# Patient Record
Sex: Male | Born: 1950 | Race: Black or African American | Hispanic: No | Marital: Married | Smoking: Never smoker
Health system: Southern US, Community
[De-identification: ages and names within clinical notes are randomized; demographics above are authoritative.]

## PROBLEM LIST (undated history)

## (undated) DIAGNOSIS — G4730 Sleep apnea, unspecified: Secondary | ICD-10-CM

## (undated) DIAGNOSIS — E785 Hyperlipidemia, unspecified: Secondary | ICD-10-CM

## (undated) DIAGNOSIS — I10 Essential (primary) hypertension: Secondary | ICD-10-CM

## (undated) DIAGNOSIS — N401 Enlarged prostate with lower urinary tract symptoms: Secondary | ICD-10-CM

## (undated) DIAGNOSIS — R351 Nocturia: Secondary | ICD-10-CM

## (undated) DIAGNOSIS — R350 Frequency of micturition: Principal | ICD-10-CM

## (undated) DIAGNOSIS — J45909 Unspecified asthma, uncomplicated: Secondary | ICD-10-CM

## (undated) DIAGNOSIS — K219 Gastro-esophageal reflux disease without esophagitis: Secondary | ICD-10-CM

## (undated) DIAGNOSIS — H409 Unspecified glaucoma: Secondary | ICD-10-CM

## (undated) HISTORY — DX: Hyperlipidemia, unspecified: E78.5

## (undated) HISTORY — DX: Benign prostatic hyperplasia with lower urinary tract symptoms: N40.1

## (undated) HISTORY — PX: OTHER SURGICAL HISTORY: SHX169

## (undated) HISTORY — DX: Frequency of micturition: R35.0

## (undated) HISTORY — DX: Unspecified glaucoma: H40.9

## (undated) HISTORY — DX: Sleep apnea, unspecified: G47.30

## (undated) HISTORY — DX: Nocturia: R35.1

## (undated) HISTORY — DX: Gastro-esophageal reflux disease without esophagitis: K21.9

---

## 2012-09-18 ENCOUNTER — Ambulatory Visit: Payer: Self-pay | Admitting: Emergency Medicine

## 2014-05-27 ENCOUNTER — Ambulatory Visit: Payer: Managed Care, Other (non HMO)

## 2014-05-27 ENCOUNTER — Ambulatory Visit
Admission: EM | Admit: 2014-05-27 | Discharge: 2014-05-27 | Disposition: A | Discharge status: Home / Self Care | Payer: Managed Care, Other (non HMO) | Attending: Family Medicine | Admitting: Family Medicine

## 2014-05-27 DIAGNOSIS — R059 Cough, unspecified: Secondary | ICD-10-CM

## 2014-05-27 DIAGNOSIS — J45909 Unspecified asthma, uncomplicated: Secondary | ICD-10-CM | POA: Diagnosis not present

## 2014-05-27 DIAGNOSIS — R05 Cough: Secondary | ICD-10-CM | POA: Insufficient documentation

## 2014-05-27 DIAGNOSIS — J40 Bronchitis, not specified as acute or chronic: Secondary | ICD-10-CM | POA: Insufficient documentation

## 2014-05-27 HISTORY — DX: Unspecified asthma, uncomplicated: J45.909

## 2014-05-27 MED ORDER — HYDROCOD POLST-CPM POLST ER 10-8 MG/5ML PO SUER: 5.0000 mL | Freq: Two times a day (BID) | ORAL | Status: DC | PRN

## 2014-05-27 MED ORDER — LEVOFLOXACIN 500 MG PO TABS: 500.0000 mg | ORAL_TABLET | Freq: Every day | ORAL | Status: DC

## 2014-05-27 NOTE — ED Provider Notes (Signed)
CSN: [id number]     Arrival date & time 05/27/14  6644 History   First MD Initiated Contact with Patient 05/27/14 0957     Chief Complaint  Patient presents with  . Cough   (Consider location/radiation/quality/duration/timing/severity/associated sxs/prior Treatment) Patient is a 64 y.o. male presenting with cough. The history is provided by the patient. No language interpreter was used.  Cough Cough characteristics:  Non-productive, paroxysmal, barking, nocturnal and hoarse Severity:  Severe Onset quality:  Unable to specify Duration:  3 weeks Timing:  Constant Progression:  Worsening Chronicity:  New Smoker: no   Context: exposure to allergens, fumes and upper respiratory infection   Context comment:  States he noticed this started he was in [place] driving back to a friend's place before he flew out and he states that the bed of the truck was open and the dust and fumes deserts scouted the tracking start coughing then Relieved by:  Nothing Worsened by:  Lying down Ineffective treatments:  Home nebulizer and cough suppressants (He has been seen at [facility] by the nurse practitioner. She had come on a Z-Pak and on his return visit placed him on oral steroids which does not help. He is also taking a codeine-based cough syrup and has been using his albuterol inhaler at home ) Associated symptoms: shortness of breath and wheezing   Associated symptoms: no chest pain, no chills, no diaphoresis, no ear fullness, no ear pain, no eye discharge, no fever, no headaches, no myalgias, no rash, no rhinorrhea, no sinus congestion, no sore throat and no weight loss   Shortness of breath:    Severity:  Unable to specify   Timing:  Intermittent   Progression:  Waxing and waning Risk factors: recent infection     Past Medical History  Diagnosis Date  . Asthma    History reviewed. No pertinent past surgical history. Family History  Problem Relation Age of Onset  . Diabetes Mother   .  Heart failure Father    History  Substance Use Topics  . Smoking status: Never Smoker   . Smokeless tobacco: Never Used  . Alcohol Use: No     Comment: occasional    Review of Systems  Constitutional: Negative for fever, chills, weight loss and diaphoresis.  HENT: Negative for ear pain, rhinorrhea and sore throat.   Eyes: Negative for discharge.  Respiratory: Positive for cough, shortness of breath and wheezing.   Cardiovascular: Negative for chest pain.  Musculoskeletal: Negative for myalgias.  Skin: Negative for rash.  Neurological: Negative for headaches.    Allergies  Review of patient's allergies indicates no known allergies.  Home Medications   Prior to Admission medications   Medication Sig Start Date End Date Taking? Authorizing Provider  benzonatate (TESSALON) 100 MG capsule Take by mouth 3 (three) times daily as needed for cough.   Yes Historical Provider, MD  fluticasone (FLONASE) 50 MCG/ACT nasal spray Place into both nostrils daily.   Yes Historical Provider, MD  guaiFENesin (MUCINEX) 600 MG 12 hr tablet Take by mouth 2 (two) times daily.   Yes Historical Provider, MD  guaiFENesin-codeine (ROBITUSSIN AC) 100-10 MG/5ML syrup Take 5 mLs by mouth 3 (three) times daily as needed for cough.   Yes Historical Provider, MD  predniSONE (DELTASONE) 20 MG tablet Take 20 mg by mouth daily with breakfast.   Yes Historical Provider, MD   BP 141/89 mmHg  Pulse 65  Temp(Src) 98.2 F (36.8 C) (Tympanic)  Resp 20  Ht 5' 8.5" (  1.74 m)  Wt 225 lb (102.059 kg)  BMI 33.71 kg/m2  SpO2 98% Physical Exam  Constitutional: He is oriented to person, place, and time. He appears well-nourished. No distress.  Obese blackmaie  HENT:  Head: Normocephalic and atraumatic.  Eyes: Pupils are equal, round, and reactive to light.  Neck: Neck supple.  Cardiovascular: Normal rate, regular rhythm and normal heart sounds.   Pulmonary/Chest: No accessory muscle usage. No respiratory distress. He  has decreased breath sounds.  Musculoskeletal: Normal range of motion. He exhibits no edema or tenderness.  Neurological: He is alert and oriented to person, place, and time.  Skin: Skin is warm. He is not diaphoretic.  Psychiatric: He has a normal mood and affect.  Vitals reviewed.   ED Course  Procedures (including critical care time) Labs Review Labs Reviewed - No data to display  Imaging Review Dg Chest 2 View  05/27/2014   CLINICAL DATA:  Cough  EXAM: CHEST  2 VIEW  COMPARISON:  09/18/2012  FINDINGS: Normal heart size. Clear lungs. No pneumothorax or pleural effusion.  IMPRESSION: No active cardiopulmonary disease.   Electronically Signed   By: Marybelle Killings M.D.   On: 05/27/2014 10:37   Chest x-ray is negative. Will place on Levaquin 500 mg 1 tablet day for 10 days, continue current prednisone and albuterol. Will place on Tussionex 1 teaspoon by mouth twice a day.  MDM   1. Bronchitis   2. Cough        Frederich Cha, MD 05/28/14 1440

## 2014-05-27 NOTE — Discharge Instructions (Signed)

## 2014-05-27 NOTE — ED Notes (Signed)
Patient with cold, cough chest congestion for the past 3 weeks. Has been treated with Z pack and steroids and not getting better feels its worse. [place] in [place] in [place] when symptoms started

## 2014-06-16 ENCOUNTER — Encounter: Payer: Self-pay | Admitting: Family Medicine

## 2014-06-16 ENCOUNTER — Ambulatory Visit (INDEPENDENT_AMBULATORY_CARE_PROVIDER_SITE_OTHER): Payer: Managed Care, Other (non HMO) | Admitting: Family Medicine

## 2014-06-16 VITALS — BP 122/80 | HR 69 | Temp 98.1°F | Resp 16 | Ht 68.5 in | Wt 222.0 lb

## 2014-06-16 DIAGNOSIS — N401 Enlarged prostate with lower urinary tract symptoms: Secondary | ICD-10-CM | POA: Insufficient documentation

## 2014-06-16 DIAGNOSIS — I10 Essential (primary) hypertension: Secondary | ICD-10-CM | POA: Insufficient documentation

## 2014-06-16 DIAGNOSIS — R351 Nocturia: Secondary | ICD-10-CM

## 2014-06-16 DIAGNOSIS — R350 Frequency of micturition: Secondary | ICD-10-CM

## 2014-06-16 DIAGNOSIS — M25539 Pain in unspecified wrist: Secondary | ICD-10-CM | POA: Insufficient documentation

## 2014-06-16 DIAGNOSIS — J4520 Mild intermittent asthma, uncomplicated: Secondary | ICD-10-CM | POA: Insufficient documentation

## 2014-06-16 DIAGNOSIS — E785 Hyperlipidemia, unspecified: Secondary | ICD-10-CM | POA: Insufficient documentation

## 2014-06-16 DIAGNOSIS — G4730 Sleep apnea, unspecified: Secondary | ICD-10-CM | POA: Insufficient documentation

## 2014-06-16 DIAGNOSIS — K219 Gastro-esophageal reflux disease without esophagitis: Secondary | ICD-10-CM | POA: Insufficient documentation

## 2014-06-16 HISTORY — DX: Nocturia: R35.1

## 2014-06-16 HISTORY — DX: Benign prostatic hyperplasia with lower urinary tract symptoms: N40.1

## 2014-06-16 HISTORY — DX: Frequency of micturition: R35.0

## 2014-06-16 LAB — POCT URINALYSIS DIPSTICK
Bilirubin, UA: NEGATIVE
Glucose, UA: NEGATIVE
KETONES UA: NEGATIVE
Leukocytes, UA: NEGATIVE
Nitrite, UA: NEGATIVE
Protein, UA: NEGATIVE
SPEC GRAV UA: 1.02
UROBILINOGEN UA: 0.2
pH, UA: 5

## 2014-06-16 MED ORDER — TAMSULOSIN HCL 0.4 MG PO CAPS: 0.4000 mg | ORAL_CAPSULE | Freq: Every day | ORAL | Status: DC

## 2014-06-16 NOTE — Progress Notes (Signed)
Name: Curtis Gregory   MRN: [id number]    DOB: October 31, 1950   Date:06/16/2014       Progress Note  Subjective  Chief Complaint  Chief Complaint  Patient presents with  . Polyuria    HPI    Urinary freq., esp.  nocturia for past several mos.  No dysuria .  No penile discharge.  No change  in urinary stream.    No problem-specific assessment & plan notes found for this encounter.   Past Medical History  Diagnosis Date  . Asthma     History  Substance Use Topics  . Smoking status: Never Smoker   . Smokeless tobacco: Never Used  . Alcohol Use: No     Comment: occasional     Current outpatient prescriptions:  .  albuterol (PROVENTIL HFA;VENTOLIN HFA) 108 (90 BASE) MCG/ACT inhaler, Inhale into the lungs., Disp: , Rfl:  .  Loratadine 10 MG CAPS, Take by mouth as needed., Disp: , Rfl:  .  Omeprazole 20 MG TBEC, Take by mouth., Disp: , Rfl:  .  rosuvastatin (CRESTOR) 20 MG tablet, Take by mouth., Disp: , Rfl:  .  tamsulosin (FLOMAX) 0.4 MG CAPS capsule, Take 1 capsule (0.4 mg total) by mouth daily after breakfast., Disp: 30 capsule, Rfl: 6  No Known Allergies  Review of Systems  Constitutional: Negative.   Respiratory: Negative.   Cardiovascular: Negative.   Gastrointestinal: Negative.   Genitourinary: Positive for frequency (esp. nocturia x 4-5).  Musculoskeletal: Negative.       Objective  Filed Vitals:   06/16/14 1134  BP: 122/80  Pulse: 69  Temp: 98.1 F (36.7 C)  Resp: 16  Height: 5' 8.5" (1.74 m)  Weight: 222 lb (100.699 kg)     Physical Exam  Constitutional: He is well-developed, well-nourished, and in no distress.  Abdominal: Soft. Bowel sounds are normal. He exhibits no mass. There is no tenderness. There is no rebound and no guarding.  Genitourinary: Rectum normal, testes/scrotum normal and penis normal. Prostate is enlarged (no nodules). Prostate is not tender.      Recent Results (from the past 2160 hour(s))  POCT urinalysis dipstick     Status:  Abnormal   Collection Time: 06/16/14 12:06 PM  Result Value Ref Range   Color, UA     Clarity, UA     Glucose, UA neg    Bilirubin, UA neg    Ketones, UA neg    Spec Grav, UA 1.020    Blood, UA trace    pH, UA 5.0    Protein, UA neg    Urobilinogen, UA 0.2    Nitrite, UA NEG    Leukocytes, UA Negative      Assessment & Plan  1. Urine frequency  - POCT UA - Glucose/Protein - Urine Culture  Meds ordered this encounter  Medications  . rosuvastatin (CRESTOR) 20 MG tablet    Sig: Take by mouth.  [name] Kitchen albuterol (PROVENTIL HFA;VENTOLIN HFA) 108 (90 BASE) MCG/ACT inhaler    Sig: Inhale into the lungs.  . Omeprazole 20 MG TBEC    Sig: Take by mouth.  . Loratadine 10 MG CAPS    Sig: Take by mouth as needed.  . tamsulosin (FLOMAX) 0.4 MG CAPS capsule    Sig: Take 1 capsule (0.4 mg total) by mouth daily after breakfast.    Dispense:  30 capsule    Refill:  6

## 2014-06-17 LAB — PSA: Prostate Specific Ag, Serum: 0.7 ng/mL (ref 0.0–4.0)

## 2014-06-18 LAB — URINE CULTURE

## 2014-06-19 ENCOUNTER — Telehealth: Payer: Self-pay

## 2014-06-19 NOTE — Telephone Encounter (Signed)
-----   Message from Arlis Porta., MD sent at 06/19/2014  8:46 AM EDT ----- Regarding: labs Tell patient that urine culture is negative for any bacteria causing a UTI.   Also tell him PSA is 0.7; totally normal.-jh

## 2014-06-19 NOTE — Telephone Encounter (Signed)
LMTCB

## 2014-06-19 NOTE — Telephone Encounter (Signed)
Advised pt as per Dr. Luan Pulling.

## 2014-10-18 ENCOUNTER — Encounter: Payer: Self-pay | Admitting: Family Medicine

## 2014-10-18 ENCOUNTER — Ambulatory Visit (INDEPENDENT_AMBULATORY_CARE_PROVIDER_SITE_OTHER): Payer: Managed Care, Other (non HMO) | Admitting: Family Medicine

## 2014-10-18 VITALS — BP 132/72 | HR 67 | Temp 98.2°F | Resp 16 | Ht 68.5 in | Wt 228.2 lb

## 2014-10-18 DIAGNOSIS — R0683 Snoring: Secondary | ICD-10-CM | POA: Diagnosis not present

## 2014-10-18 DIAGNOSIS — Z23 Encounter for immunization: Secondary | ICD-10-CM | POA: Diagnosis not present

## 2014-10-18 DIAGNOSIS — R350 Frequency of micturition: Secondary | ICD-10-CM

## 2014-10-18 DIAGNOSIS — M436 Torticollis: Secondary | ICD-10-CM | POA: Insufficient documentation

## 2014-10-18 DIAGNOSIS — N401 Enlarged prostate with lower urinary tract symptoms: Secondary | ICD-10-CM | POA: Diagnosis not present

## 2014-10-18 MED ORDER — MELOXICAM 15 MG PO TABS: 15.0000 mg | ORAL_TABLET | Freq: Every day | ORAL | Status: DC

## 2014-10-18 MED ORDER — CYCLOBENZAPRINE HCL 10 MG PO TABS: ORAL_TABLET | ORAL | Status: DC

## 2014-10-18 MED ORDER — TAMSULOSIN HCL 0.4 MG PO CAPS: ORAL_CAPSULE | ORAL | Status: DC

## 2014-10-18 NOTE — Progress Notes (Signed)
Name: Curtis Gregory   MRN: [id number]    DOB: 1950-09-25   Date:10/18/2014       Progress Note  Subjective  Chief Complaint  Chief Complaint  Patient presents with  . Urinary Frequency  . Snoring  . Torticollis    HPI Here for f/u of BPH.  Flomax 0.4 helped for awhile, but back urinating every 2 hrs at night.  Also c/o snoring.  Had neg sleep study several yrs ago.  L side of neck sore and stiff x 2 days.  No injury. No problem-specific assessment & plan notes found for this encounter.   Past Medical History  Diagnosis Date  . Asthma     History reviewed. No pertinent past surgical history.  Family History  Problem Relation Age of Onset  . Diabetes Mother   . Heart disease Mother   . Alzheimer's disease Father     Social History   Social History  . Marital Status: Married    Spouse Name: N/A  . Number of Children: N/A  . Years of Education: N/A   Occupational History  . Not on file.   Social History Main Topics  . Smoking status: Never Smoker   . Smokeless tobacco: Never Used  . Alcohol Use: No     Comment: occasional  . Drug Use: No  . Sexual Activity: Not Currently   Other Topics Concern  . Not on file   Social History Narrative     Current outpatient prescriptions:  .  albuterol (PROVENTIL HFA;VENTOLIN HFA) 108 (90 BASE) MCG/ACT inhaler, Inhale into the lungs., Disp: , Rfl:  .  latanoprost (XALATAN) 0.005 % ophthalmic solution, INSTILL ONE DROP IN BOTH EYES QHS., Disp: , Rfl: 0 .  Omeprazole 20 MG TBEC, Take by mouth., Disp: , Rfl:  .  rosuvastatin (CRESTOR) 20 MG tablet, Take by mouth., Disp: , Rfl:  .  cyclobenzaprine (FLEXERIL) 10 MG tablet, Take 1/2 - 1 tablet up to three times a day for muscle spasm, Disp: 30 tablet, Rfl: 0 .  Loratadine 10 MG CAPS, Take by mouth as needed., Disp: , Rfl:  .  meloxicam (MOBIC) 15 MG tablet, Take 1 tablet (15 mg total) by mouth daily., Disp: 30 tablet, Rfl: 2 .  tamsulosin (FLOMAX) 0.4 MG CAPS capsule, Take 2  tablets each morning at the same time each day., Disp: 60 capsule, Rfl: 12  No Known Allergies   Review of Systems  Constitutional: Negative for fever, chills, weight loss and malaise/fatigue.  HENT: Negative for congestion and hearing loss.   Eyes: Negative for blurred vision and double vision.  Respiratory: Negative for cough, sputum production, shortness of breath and wheezing.   Cardiovascular: Negative for chest pain, palpitations, orthopnea and leg swelling.  Gastrointestinal: Negative for heartburn, abdominal pain and blood in stool.  Genitourinary: Positive for urgency and frequency. Negative for dysuria and hematuria.  Skin: Negative for rash.  Neurological: Negative for dizziness, tremors, weakness and headaches.      Objective  Filed Vitals:   10/18/14 1317  BP: 132/72  Pulse: 67  Temp: 98.2 F (36.8 C)  Resp: 16  Height: 5' 8.5" (1.74 m)  Weight: 228 lb 3.2 oz (103.511 kg)    Physical Exam  Constitutional: He is well-developed, well-nourished, and in no distress. No distress.  HENT:  Head: Normocephalic and atraumatic.  Right Ear: External ear normal.  Left Ear: External ear normal.  Nose: Nose normal.  Mouth/Throat: Uvula is midline. No oropharyngeal exudate, posterior oropharyngeal edema  or posterior oropharyngeal erythema.    Eyes: Conjunctivae and EOM are normal. Pupils are equal, round, and reactive to light. No scleral icterus.  Neck: Normal range of motion. No thyromegaly present.  Cardiovascular: Normal rate, regular rhythm, normal heart sounds and intact distal pulses.  Exam reveals no gallop and no friction rub.   No murmur heard. Pulmonary/Chest: Effort normal and breath sounds normal. No respiratory distress. He has no wheezes. He has no rales.  Musculoskeletal:  L neck trap tender and stiff at junction of shoulder trap.  Lymphadenopathy:    He has no cervical adenopathy.  Vitals reviewed.      No results found for this or any previous  visit (from the past 2160 hour(s)).   Assessment & Plan  Problem List Items Addressed This Visit      Musculoskeletal and Integument   Torticollis, acute   Relevant Medications   meloxicam (MOBIC) 15 MG tablet   cyclobenzaprine (FLEXERIL) 10 MG tablet     Genitourinary   Benign prostatic hypertrophy with urinary frequency   Relevant Medications   tamsulosin (FLOMAX) 0.4 MG CAPS capsule     Other   Snoring   Relevant Orders   Ambulatory referral to ENT    Other Visit Diagnoses    Need for influenza vaccination    -  Primary    Relevant Orders    Flu Vaccine QUAD 36+ mos PF IM (Fluarix & Fluzone Quad PF) (Completed)       Meds ordered this encounter  Medications  . latanoprost (XALATAN) 0.005 % ophthalmic solution    Sig: INSTILL ONE DROP IN BOTH EYES QHS.    Refill:  0  . tamsulosin (FLOMAX) 0.4 MG CAPS capsule    Sig: Take 2 tablets each morning at the same time each day.    Dispense:  60 capsule    Refill:  12  . meloxicam (MOBIC) 15 MG tablet    Sig: Take 1 tablet (15 mg total) by mouth daily.    Dispense:  30 tablet    Refill:  2    For musculoskeletal pain  . cyclobenzaprine (FLEXERIL) 10 MG tablet    Sig: Take 1/2 - 1 tablet up to three times a day for muscle spasm    Dispense:  30 tablet    Refill:  0   1. Need for influenza vaccination  - Flu Vaccine QUAD 36+ mos PF IM (Fluarix & Fluzone Quad PF)  2. Benign prostatic hypertrophy with urinary frequency  - tamsulosin (FLOMAX) 0.4 MG CAPS capsule; Take 2 tablets each morning at the same time each day.  Dispense: 60 capsule; Refill: 12  3. Snoring  - Ambulatory referral to ENT  4. Torticollis, acute  - meloxicam (MOBIC) 15 MG tablet; Take 1 tablet (15 mg total) by mouth daily.  Dispense: 30 tablet; Refill: 2 - cyclobenzaprine (FLEXERIL) 10 MG tablet; Take 1/2 - 1 tablet up to three times a day for muscle spasm  Dispense: 30 tablet; Refill: 0

## 2014-10-18 NOTE — Patient Instructions (Signed)
Do not increase Flomax until finished with Flexeril

## 2014-10-20 ENCOUNTER — Telehealth: Payer: Self-pay | Admitting: *Deleted

## 2014-10-20 NOTE — Telephone Encounter (Signed)
Appt scheduled 10/24/2014 @ 9am.

## 2014-10-25 ENCOUNTER — Telehealth: Payer: Self-pay | Admitting: *Deleted

## 2014-10-25 NOTE — Telephone Encounter (Signed)
Patient no-showed appt with Dr. Tami Ribas on 10/24/14. Left contact info on ans. Machine for patient to reschedule.

## 2015-03-13 ENCOUNTER — Other Ambulatory Visit: Payer: Self-pay | Admitting: Family Medicine

## 2015-03-26 ENCOUNTER — Other Ambulatory Visit: Payer: Self-pay | Admitting: Family Medicine

## 2015-04-30 ENCOUNTER — Ambulatory Visit (INDEPENDENT_AMBULATORY_CARE_PROVIDER_SITE_OTHER): Payer: Managed Care, Other (non HMO) | Admitting: Family Medicine

## 2015-04-30 ENCOUNTER — Encounter: Payer: Self-pay | Admitting: Family Medicine

## 2015-04-30 VITALS — BP 136/85 | HR 71 | Temp 98.4°F | Resp 16 | Ht 68.5 in | Wt 228.0 lb

## 2015-04-30 DIAGNOSIS — R350 Frequency of micturition: Secondary | ICD-10-CM | POA: Diagnosis not present

## 2015-04-30 DIAGNOSIS — J302 Other seasonal allergic rhinitis: Secondary | ICD-10-CM | POA: Insufficient documentation

## 2015-04-30 DIAGNOSIS — N401 Enlarged prostate with lower urinary tract symptoms: Secondary | ICD-10-CM | POA: Diagnosis not present

## 2015-04-30 MED ORDER — BENZONATATE 100 MG PO CAPS: 100.0000 mg | ORAL_CAPSULE | Freq: Three times a day (TID) | ORAL | Status: DC | PRN

## 2015-04-30 MED ORDER — FLUTICASONE PROPIONATE 50 MCG/ACT NA SUSP: 2.0000 | Freq: Every day | NASAL | Status: DC

## 2015-04-30 NOTE — Patient Instructions (Signed)
Continue Zyrtec 10 mg daily

## 2015-04-30 NOTE — Progress Notes (Signed)
Name: Curtis Gregory   MRN: [id number]    DOB: 15-Sep-1950   Date:04/30/2015       Progress Note  Subjective  Chief Complaint  Chief Complaint  Patient presents with  . Cough    2 days     HPI Here c/o nasal congestion x 2 days.  Now with throaty cough, with some mucus.  Not SOB.  Lots of throat clearing.  Taking Zyrtec x 2 days.  No fever.  States that increasing dose of Flomax has not helped nocturia  No problem-specific assessment & plan notes found for this encounter.   Past Medical History  Diagnosis Date  . Asthma     Social History  Substance Use Topics  . Smoking status: Never Smoker   . Smokeless tobacco: Never Used  . Alcohol Use: No     Comment: occasional     Current outpatient prescriptions:  .  albuterol (PROVENTIL HFA;VENTOLIN HFA) 108 (90 BASE) MCG/ACT inhaler, Inhale into the lungs., Disp: , Rfl:  .  CRESTOR 20 MG tablet, TAKE 1 TABLET BY MOUTH EVERY NIGHT AT BEDTIME, Disp: 90 tablet, Rfl: 0 .  cyclobenzaprine (FLEXERIL) 10 MG tablet, Take 1/2 - 1 tablet up to three times a day for muscle spasm, Disp: 30 tablet, Rfl: 0 .  latanoprost (XALATAN) 0.005 % ophthalmic solution, INSTILL ONE DROP IN BOTH EYES QHS., Disp: , Rfl: 0 .  Loratadine 10 MG CAPS, Take by mouth as needed., Disp: , Rfl:  .  meloxicam (MOBIC) 15 MG tablet, Take 1 tablet (15 mg total) by mouth daily., Disp: 30 tablet, Rfl: 2 .  omeprazole (PRILOSEC) 20 MG capsule, TAKE 1 CAPSULE BY MOUTH ONCE DAILY, Disp: 90 capsule, Rfl: 0 .  tamsulosin (FLOMAX) 0.4 MG CAPS capsule, Take 2 tablets each morning at the same time each day., Disp: 60 capsule, Rfl: 12 .  benzonatate (TESSALON) 100 MG capsule, Take 1 capsule (100 mg total) by mouth 3 (three) times daily as needed for cough., Disp: 20 capsule, Rfl: 2 .  fluticasone (FLONASE) 50 MCG/ACT nasal spray, Place 2 sprays into both nostrils daily., Disp: 16 g, Rfl: 6  Not on File  Review of Systems  Constitutional: Negative for fever, chills, weight loss  and malaise/fatigue.  HENT: Positive for congestion and sore throat. Negative for ear pain and hearing loss.   Eyes: Negative for blurred vision and double vision.  Respiratory: Positive for cough (throaty). Negative for shortness of breath and wheezing.   Cardiovascular: Negative for chest pain, palpitations and leg swelling.  Gastrointestinal: Negative for heartburn, abdominal pain and blood in stool.  Genitourinary: Positive for urgency and frequency. Negative for dysuria and hematuria.  Skin: Negative for rash.  Neurological: Negative for weakness and headaches.      Objective  Filed Vitals:   04/30/15 1054  BP: 136/85  Pulse: 71  Temp: 98.4 F (36.9 C)  Resp: 16  Height: 5' 8.5" (1.74 m)  Weight: 228 lb (103.42 kg)  SpO2: 99%     Physical Exam  Constitutional: He is well-developed, well-nourished, and in no distress. No distress.  HENT:  Head: Normocephalic and atraumatic.  Right Ear: External ear normal.  Left Ear: External ear normal.  Nose: Rhinorrhea (clear) present.  Mouth/Throat: Oropharynx is clear and moist.  Eyes: Conjunctivae and EOM are normal. Pupils are equal, round, and reactive to light. No scleral icterus.  Neck: Normal range of motion. Neck supple. Carotid bruit is not present. No thyromegaly present.  Cardiovascular: Normal rate, regular  rhythm and normal heart sounds.  Exam reveals no gallop and no friction rub.   No murmur heard. Pulmonary/Chest: Effort normal. No respiratory distress. He has no wheezes. He has no rales.  Lymphadenopathy:    He has no cervical adenopathy.  Vitals reviewed.     No results found for this or any previous visit (from the past 2160 hour(s)).   Assessment & Plan 1. Seasonal allergies  - fluticasone (FLONASE) 50 MCG/ACT nasal spray; Place 2 sprays into both nostrils daily.  Dispense: 16 g; Refill: 6 - benzonatate (TESSALON) 100 MG capsule; Take 1 capsule (100 mg total) by mouth 3 (three) times daily as needed  for cough.  Dispense: 20 capsule; Refill: 2 Cont. Zyrtec 10 mg/d.  2. Benign prostatic hypertrophy with urinary frequency  - Ambulatory referral to Urology

## 2015-04-30 NOTE — Addendum Note (Signed)
Addended by: Theresia Majors A on: 04/30/2015 12:01 PM   Modules accepted: Orders

## 2015-05-28 ENCOUNTER — Other Ambulatory Visit: Payer: Self-pay | Admitting: Family Medicine

## 2015-06-04 ENCOUNTER — Encounter: Payer: Self-pay | Admitting: Urology

## 2015-06-04 ENCOUNTER — Ambulatory Visit (INDEPENDENT_AMBULATORY_CARE_PROVIDER_SITE_OTHER): Payer: Managed Care, Other (non HMO) | Admitting: Urology

## 2015-06-04 VITALS — BP 133/91 | HR 75 | Ht 68.5 in | Wt 234.0 lb

## 2015-06-04 DIAGNOSIS — R350 Frequency of micturition: Secondary | ICD-10-CM

## 2015-06-04 DIAGNOSIS — N401 Enlarged prostate with lower urinary tract symptoms: Secondary | ICD-10-CM

## 2015-06-04 LAB — MICROSCOPIC EXAMINATION
Bacteria, UA: NONE SEEN
RBC, UA: NONE SEEN /hpf (ref 0–?)

## 2015-06-04 LAB — URINALYSIS, COMPLETE
BILIRUBIN UA: NEGATIVE
GLUCOSE, UA: NEGATIVE
LEUKOCYTES UA: NEGATIVE
Nitrite, UA: NEGATIVE
RBC UA: NEGATIVE
Urobilinogen, Ur: 0.2 mg/dL (ref 0.2–1.0)
pH, UA: 5 (ref 5.0–7.5)

## 2015-06-04 LAB — BLADDER SCAN AMB NON-IMAGING

## 2015-06-04 MED ORDER — FINASTERIDE 5 MG PO TABS: 5.0000 mg | ORAL_TABLET | Freq: Every day | ORAL | Status: DC

## 2015-06-04 NOTE — Progress Notes (Signed)
06/04/2015 3:36 PM   Curtis Gregory [birth year] [id number]  Referring provider: Arlis Porta., MD [place], [place] [address]  Chief Complaint  Patient presents with  . Benign Prostatic Hypertrophy    New Patient    HPI:  1 - Enlarged Prostate with Lower Urinary Tract Symptoms - slowly progressive bother from mix of irritative and obstrucive symptoms with hesitancy, freqeuncy, weak stream, nocturia. No h/o Gu trauma or recurrent STI. No incontinence. DRE 05/2015 70gm+ smooth, PVR 05/2015 "10mL" (normal, no retention). Given trial of tamsulsoin up to BID by PCP and with some improvement but still sig bothored. IPSS 7 QOL4.  2 - Prostate Cancer Screening - pt's  Father with prostate cancer. 05/2015 PSA 0.7  / DRE 70gm smooth.  No prior surgeries. NO CV disease / strong blood thinners. His PCP is Larene Beach MD  Today " Curtis Gregory " is seen as new patient for above.   PMH: Past Medical History  Diagnosis Date  . Asthma   . GERD (gastroesophageal reflux disease)   . Glaucoma   . Hyperlipemia   . Sleep apnea   . Essential (primary) hypertension 06/16/2014  . Benign prostatic hypertrophy with urinary frequency 06/16/2014  . Excessive urination at night 06/16/2014    Surgical History: Past Surgical History  Procedure Laterality Date  . None      Home Medications:    Medication List       This list is accurate as of: 06/04/15  3:36 PM.  Always use your most recent med list.               albuterol 108 (90 Base) MCG/ACT inhaler  Commonly known as:  PROVENTIL HFA;VENTOLIN HFA  Inhale into the lungs. Reported on 06/04/2015     CRESTOR 20 MG tablet  Generic drug:  rosuvastatin  TAKE 1 TABLET BY MOUTH EVERY NIGHT AT BEDTIME     finasteride 5 MG tablet  Commonly known as:  PROSCAR  Take 1 tablet (5 mg total) by mouth daily.     fluticasone 50 MCG/ACT nasal spray  Commonly known as:  FLONASE  Place 2 sprays into both nostrils daily.     latanoprost 0.005 %  ophthalmic solution  Commonly known as:  XALATAN  INSTILL ONE DROP IN BOTH EYES QHS.     omeprazole 20 MG capsule  Commonly known as:  PRILOSEC  TAKE 1 CAPSULE BY MOUTH ONCE DAILY     tamsulosin 0.4 MG Caps capsule  Commonly known as:  FLOMAX  Take 2 tablets each morning at the same time each day.        Allergies: No Known Allergies  Family History: Family History  Problem Relation Age of Onset  . Diabetes Mother   . Heart disease Mother   . Alzheimer's disease Father   . Prostate cancer Father   . Kidney cancer Neg Hx     Social History:  reports that he has never smoked. He has never used smokeless tobacco. He reports that he does not drink alcohol or use illicit drugs.  ROS: UROLOGY Frequent Urination?: Yes Hard to postpone urination?: Yes Burning/pain with urination?: No Get up at night to urinate?: Yes Leakage of urine?: No Urine stream starts and stops?: No Trouble starting stream?: No Do you have to strain to urinate?: No Blood in urine?: No Urinary tract infection?: No Sexually transmitted disease?: No Injury to kidneys or bladder?: No Painful intercourse?: No Weak stream?: No Erection problems?: Yes  Penile pain?: No  Gastrointestinal Nausea?: No Vomiting?: No Indigestion/heartburn?: No Diarrhea?: No Constipation?: No  Constitutional Fever: No Night sweats?: No Weight loss?: No Fatigue?: No  Skin Skin rash/lesions?: No Itching?: No  Eyes Blurred vision?: No Double vision?: No  Ears/Nose/Throat Sore throat?: No Sinus problems?: No  Hematologic/Lymphatic Swollen glands?: No Easy bruising?: No  Cardiovascular Leg swelling?: No Chest pain?: No  Respiratory Cough?: No Shortness of breath?: No  Endocrine Excessive thirst?: No  Musculoskeletal Back pain?: No Joint pain?: No  Neurological Headaches?: No Dizziness?: No  Psychologic Depression?: No Anxiety?: No  Physical Exam: BP 133/91 mmHg  Pulse 75  Ht 5' 8.5"  (1.74 m)  Wt 234 lb (106.142 kg)  BMI 35.06 kg/m2  Constitutional:  Alert and oriented, No acute distress. HEENT: [place] AT, moist mucus membranes.  Trachea midline, no masses. Cardiovascular: No clubbing, cyanosis, or edema. Respiratory: Normal respiratory effort, no increased work of breathing. GI: Abdomen is soft, nontender, nondistended, no abdominal masses. Moderate truncal obesity. GU: No CVA tenderness. No testes masses. Non-circ'd. No paraphimosis. DRE 70gm+ smooth. Skin: No rashes, bruises or suspicious lesions. Lymph: No cervical or inguinal adenopathy. Neurologic: Grossly intact, no focal deficits, moving all 4 extremities. Psychiatric: Normal mood and affect.  Laboratory Data: No results found for: WBC, HGB, HCT, MCV, PLT  No results found for: CREATININE  No results found for: PSA  No results found for: TESTOSTERONE  No results found for: HGBA1C  Urinalysis    Component Value Date/Time   BILIRUBINUR neg 06/16/2014 1206   PROTEINUR neg 06/16/2014 1206   UROBILINOGEN 0.2 06/16/2014 1206   NITRITE NEG 06/16/2014 1206   LEUKOCYTESUR Negative 06/16/2014 1206     Assessment & Plan:    1 - Enlarged Prostate with Lower Urinary Tract Symptoms - likely BPH by exam and history. Discussed further non-alpha blocker options incluidng surgery (TURP v. Simple prostatecotmy given exam) v. Additional medical therapy with finasteride. He wants to try finasteride. Discussed time course to impromvent measured in mos with peak effect 6-12 mos and then stabilizing effect on what is otherwise progressive problem. Discussed very rare breast tenderness.  2 - Prostate Cancer Screening - up to date this year, continue until age 74 or so as above average risk and with very little comorbidity.  3 - RTC 6 mos to see if meeting treatment goals, then anually v. Prn of doing well v. TRUS / CYSTO and consider TURP / simple prostatecotmy if not doing well.     Return in about 6 months (around  12/05/2015).  Alexis Frock, [facility] [address], [place], [place] [address] [phone]

## 2015-07-19 ENCOUNTER — Other Ambulatory Visit: Payer: Self-pay | Admitting: Family Medicine

## 2015-08-16 ENCOUNTER — Other Ambulatory Visit: Payer: Self-pay | Admitting: Family Medicine

## 2015-08-16 MED ORDER — HYDROCORTISONE ACETATE 25 MG RE SUPP: 25.0000 mg | Freq: Two times a day (BID) | RECTAL | 0 refills | Status: DC

## 2015-08-16 NOTE — Telephone Encounter (Signed)
Done.[place] 

## 2015-08-16 NOTE — Addendum Note (Signed)
Addended by: Devona Konig on: 08/16/2015 04:21 PM   Modules accepted: Orders

## 2015-08-16 NOTE — Telephone Encounter (Signed)
Send in Rx for Anusol HC suppositories, 1 in rectum twice a day for 6 days. (#12 / 0 refills).-jh

## 2015-08-16 NOTE — Telephone Encounter (Signed)
Pt. Called requesting a refill on hemorrhoids medication. Pt. Call back # is [phone]

## 2015-10-22 ENCOUNTER — Other Ambulatory Visit: Payer: Self-pay | Admitting: Family Medicine

## 2015-11-04 ENCOUNTER — Other Ambulatory Visit: Payer: Self-pay | Admitting: Family Medicine

## 2015-11-04 DIAGNOSIS — N401 Enlarged prostate with lower urinary tract symptoms: Secondary | ICD-10-CM

## 2015-11-04 DIAGNOSIS — R350 Frequency of micturition: Principal | ICD-10-CM

## 2015-11-08 ENCOUNTER — Ambulatory Visit (INDEPENDENT_AMBULATORY_CARE_PROVIDER_SITE_OTHER): Payer: BC Managed Care – PPO | Admitting: *Deleted

## 2015-11-08 DIAGNOSIS — Z23 Encounter for immunization: Secondary | ICD-10-CM | POA: Diagnosis not present

## 2015-11-23 ENCOUNTER — Ambulatory Visit (INDEPENDENT_AMBULATORY_CARE_PROVIDER_SITE_OTHER): Payer: Managed Care, Other (non HMO) | Admitting: Family Medicine

## 2015-11-23 ENCOUNTER — Encounter: Payer: Self-pay | Admitting: Family Medicine

## 2015-11-23 VITALS — BP 142/87 | HR 89 | Temp 98.3°F | Resp 16 | Ht 68.5 in | Wt 232.0 lb

## 2015-11-23 DIAGNOSIS — J209 Acute bronchitis, unspecified: Secondary | ICD-10-CM

## 2015-11-23 DIAGNOSIS — J302 Other seasonal allergic rhinitis: Secondary | ICD-10-CM

## 2015-11-23 MED ORDER — HYDROCOD POLST-CPM POLST ER 10-8 MG/5ML PO SUER
5.0000 mL | Freq: Two times a day (BID) | ORAL | 0 refills | Status: DC | PRN
Start: 2015-11-23 — End: 2016-05-22

## 2015-11-23 MED ORDER — AZITHROMYCIN 250 MG PO TABS: ORAL_TABLET | ORAL | 0 refills | Status: DC

## 2015-11-23 MED ORDER — IPRATROPIUM BROMIDE 0.06 % NA SOLN: 2.0000 | Freq: Four times a day (QID) | NASAL | 0 refills | Status: DC

## 2015-11-23 NOTE — Patient Instructions (Signed)
Thank you for coming in to clinic today.  1. It sounds like you had an Upper Respiratory Virus that has settled into a Bronchitis, lower respiratory tract infection. I don't have concerns for pneumonia today, and think that this should gradually improve. Once you are feeling better, the cough may take a few weeks to fully resolve. - Start Azithromycin Z pak (antibiotic) 2 tabs day 1, then 1 tab x 4 days, complete entire course even if improved - Start Atrovent nasal spray 2 sprays in each nostril up to max 3-4 times a day up to 3-4 days for nasal congestion, if you use for longer than this it may no longer be effective, stop using at 1 week. - Start OTC Mucinex for 1 week or less, to help clear the mucus - Drink plenty of fluids to improve congestion - You may try over the counter Nasal Saline spray (Simply Saline, Ocean Spray) as needed to reduce congestion. - If the sinus drainage continues but cough improves, you can try Loratadine (Claritin) 10mg  daily for up to 2-4 weeks to dry it up  Given rx Tussionex cough syrup, only fill this if your cough is worsening and will interfere with your upcoming travel plans.  Please schedule a follow-up appointment with Dr. Parks Ranger as needed in 2 weeks for Bronchitis if worsening  If you have any other questions or concerns, please feel free to call the clinic or send a message through [place]. You may also schedule an earlier appointment if necessary.  [name], DO [place]

## 2015-11-23 NOTE — Progress Notes (Signed)
Subjective:    Patient ID: Curtis Gregory, male    DOB: 16-May-1950, 65 y.o.   MRN: [id number]  Curtis Gregory is a 65 y.o. male presenting on 11/23/2015 for Nasal Congestion (cough congestion drainage onset 4 days --no fever or chills )   HPI   ACUTE BRONCHITIS / URI / ALLERGIC RHINITIS: - Reports he was previously well until symptoms started after plane ride back from [place] last week, no known sick contacts but many people coughing on plane. Symptoms with nasal congestion, post nasal drip, sore irritated throat, cough. Now worsening thick productive cough - Tried Mucinex for 1-2 days with mild relief - History of asthma, has albuterol inhaler has not tried yet - He has a trip to [place] next week plane ride on Monday and would like medicine to prevent worsening, he needs to give a presentation and concerned about cough. Tessalon perls do not work for him in past. - Denies any fevers/chills, dyspnea, chest pains, headache, sinus pain or pressure, purulent drainage, nausea, vomiting  Social History  Substance Use Topics  . Smoking status: Never Smoker  . Smokeless tobacco: Never Used  . Alcohol use No     Comment: occasional    Review of Systems Per HPI unless specifically indicated above     Objective:    BP (!) 142/87   Pulse 89   Temp 98.3 F (36.8 C) (Oral)   Resp 16   Ht 5' 8.5" (1.74 m)   Wt 232 lb (105.2 kg)   SpO2 97%   BMI 34.76 kg/m   Wt Readings from Last 3 Encounters:  11/23/15 232 lb (105.2 kg)  06/04/15 234 lb (106.1 kg)  04/30/15 228 lb (103.4 kg)    Physical Exam  Constitutional: He appears well-developed and well-nourished. No distress.  Well-appearing, comfortable, cooperative  HENT:  Head: Normocephalic and atraumatic.  Mouth/Throat: Oropharynx is clear and moist.  Frontal / maxillary sinuses non-tender. Nares patent without purulence or edema. Bilateral TMs clear without erythema, effusion or bulging. Oropharynx clear without erythema, exudates,  edema or asymmetry.  Eyes: Conjunctivae are normal. Right eye exhibits no discharge. Left eye exhibits no discharge.  Neck: Normal range of motion. Neck supple.  Cardiovascular: Normal rate, regular rhythm, normal heart sounds and intact distal pulses.   No murmur heard. Pulmonary/Chest: Effort normal and breath sounds normal. No respiratory distress. He has no wheezes. He has no rales.  Mild coarse breath sounds transmitted upper airway, clear with cough.  Musculoskeletal: He exhibits no edema.  Lymphadenopathy:    He has no cervical adenopathy.  Neurological: He is alert.  Skin: Skin is warm and dry. He is not diaphoretic.  Nursing note and vitals reviewed.       Assessment & Plan:   Problem List Items Addressed This Visit    Seasonal allergies    Likely contributing with some sinus drainage, and post nasal drip with cough. - Symptomatic control, use atrovent now, then consider loratadine, nasal saline       Other Visit Diagnoses    Acute bronchitis, unspecified organism    -  Primary  Consistent with worsening bronchitis in setting of likely viral URI (+air travel sick contacts). Concern with duration >1 week, and upcoming plane travel again next week - Afebrile, no focal signs of infection (not consistent with pneumonia by history or exam), no evidence sinusitis. No significant bronchospasm   Plan: 1. Start Azithromycin Z-pak dosing 500mg  then 250mg  daily x 4 days 2. Atrovent nasal spray  for congestion 3. Rx printed for Tussionex syrup PRN cough ONLY to fill if no improvement in cough over weekend before trip, to allow him to give presentation 4. Recommend trial OTC - Mucinex, Tylenol/Ibuprofen PRN, Nasal saline, lozenges, tea with honey/lemon 5. Return criteria reviewed, follow-up within 1 week if not improved    Relevant Medications   azithromycin (ZITHROMAX Z-PAK) 250 MG tablet   ipratropium (ATROVENT) 0.06 % nasal spray   chlorpheniramine-HYDROcodone (TUSSIONEX  PENNKINETIC ER) 10-8 MG/5ML SUER      Meds ordered this encounter  Medications  . azithromycin (ZITHROMAX Z-PAK) 250 MG tablet    Sig: Take 2 tabs (500mg  total) on Day 1. Take 1 tab (250mg ) daily for next 4 days.    Dispense:  6 tablet    Refill:  0  . ipratropium (ATROVENT) 0.06 % nasal spray    Sig: Place 2 sprays into both nostrils 4 (four) times daily. For up to 5-7 days then stop.    Dispense:  15 mL    Refill:  0  . chlorpheniramine-HYDROcodone (TUSSIONEX PENNKINETIC ER) 10-8 MG/5ML SUER    Sig: Take 5 mLs by mouth every 12 (twelve) hours as needed for cough.    Dispense:  115 mL    Refill:  0      Follow up plan: Return in about 2 weeks (around 12/07/2015), or if symptoms worsen or fail to improve, for bronchitis.  [name], DO [facility] 11/23/2015, 4:41 PM

## 2015-11-23 NOTE — Assessment & Plan Note (Signed)
Likely contributing with some sinus drainage, and post nasal drip with cough. - Symptomatic control, use atrovent now, then consider loratadine, nasal saline

## 2015-12-13 ENCOUNTER — Ambulatory Visit: Payer: Managed Care, Other (non HMO) | Admitting: Urology

## 2015-12-13 ENCOUNTER — Encounter: Payer: Self-pay | Admitting: Urology

## 2016-01-25 ENCOUNTER — Other Ambulatory Visit: Payer: Self-pay | Admitting: Family Medicine

## 2016-01-25 MED ORDER — ROSUVASTATIN CALCIUM 20 MG PO TABS: 20.0000 mg | ORAL_TABLET | Freq: Every day | ORAL | 0 refills | Status: DC

## 2016-05-22 ENCOUNTER — Ambulatory Visit (INDEPENDENT_AMBULATORY_CARE_PROVIDER_SITE_OTHER): Payer: Managed Care, Other (non HMO) | Admitting: Family Medicine

## 2016-05-22 ENCOUNTER — Encounter: Payer: Self-pay | Admitting: Family Medicine

## 2016-05-22 VITALS — BP 134/82 | HR 60 | Temp 98.2°F | Resp 16 | Ht 68.5 in | Wt 224.0 lb

## 2016-05-22 DIAGNOSIS — Z0000 Encounter for general adult medical examination without abnormal findings: Secondary | ICD-10-CM | POA: Diagnosis not present

## 2016-05-22 DIAGNOSIS — K219 Gastro-esophageal reflux disease without esophagitis: Secondary | ICD-10-CM | POA: Diagnosis not present

## 2016-05-22 DIAGNOSIS — E782 Mixed hyperlipidemia: Secondary | ICD-10-CM | POA: Diagnosis not present

## 2016-05-22 DIAGNOSIS — Z114 Encounter for screening for human immunodeficiency virus [HIV]: Secondary | ICD-10-CM

## 2016-05-22 DIAGNOSIS — I10 Essential (primary) hypertension: Secondary | ICD-10-CM

## 2016-05-22 DIAGNOSIS — Z1211 Encounter for screening for malignant neoplasm of colon: Secondary | ICD-10-CM | POA: Diagnosis not present

## 2016-05-22 DIAGNOSIS — Z86010 Personal history of colonic polyps: Secondary | ICD-10-CM | POA: Diagnosis not present

## 2016-05-22 DIAGNOSIS — R7309 Other abnormal glucose: Secondary | ICD-10-CM

## 2016-05-22 DIAGNOSIS — Z1159 Encounter for screening for other viral diseases: Secondary | ICD-10-CM | POA: Diagnosis not present

## 2016-05-22 DIAGNOSIS — N401 Enlarged prostate with lower urinary tract symptoms: Secondary | ICD-10-CM

## 2016-05-22 DIAGNOSIS — Z125 Encounter for screening for malignant neoplasm of prostate: Secondary | ICD-10-CM | POA: Diagnosis not present

## 2016-05-22 DIAGNOSIS — R350 Frequency of micturition: Secondary | ICD-10-CM | POA: Diagnosis not present

## 2016-05-22 LAB — CBC WITH DIFFERENTIAL/PLATELET
Basophils Absolute: 41 cells/uL (ref 0–200)
Basophils Relative: 1 %
Eosinophils Absolute: 205 cells/uL (ref 15–500)
Eosinophils Relative: 5 %
HEMATOCRIT: 44.4 % (ref 38.5–50.0)
HEMOGLOBIN: 14.7 g/dL (ref 13.2–17.1)
LYMPHS ABS: 2009 {cells}/uL (ref 850–3900)
Lymphocytes Relative: 49 %
MCH: 30.4 pg (ref 27.0–33.0)
MCHC: 33.1 g/dL (ref 32.0–36.0)
MCV: 91.9 fL (ref 80.0–100.0)
MPV: 8.8 fL (ref 7.5–12.5)
Monocytes Absolute: 369 cells/uL (ref 200–950)
Monocytes Relative: 9 %
NEUTROS PCT: 36 %
Neutro Abs: 1476 cells/uL — ABNORMAL LOW (ref 1500–7800)
Platelets: 225 10*3/uL (ref 140–400)
RBC: 4.83 MIL/uL (ref 4.20–5.80)
RDW: 13.8 % (ref 11.0–15.0)
WBC: 4.1 10*3/uL (ref 3.8–10.8)

## 2016-05-22 MED ORDER — ROSUVASTATIN CALCIUM 20 MG PO TABS: 20.0000 mg | ORAL_TABLET | Freq: Every day | ORAL | 3 refills | Status: DC

## 2016-05-22 MED ORDER — OMEPRAZOLE 20 MG PO CPDR: 20.0000 mg | DELAYED_RELEASE_CAPSULE | Freq: Every day | ORAL | 3 refills | Status: DC

## 2016-05-22 NOTE — Patient Instructions (Addendum)
Thank you for coming to the clinic today.  1. Keep up the good work with lifestyle diet, exercise  2. Check BP occasionally at home or pharmacy, write down readings, bring to next visit - Goal to keep BP < 140/90, but prefer lower 130s or less  3. For nipples, continue moisturizer, may try low potency topical OTC Hydrocortisone for 1-2 weeks at time - Ask Dr Tresa Moore about Finasteride causing potential side effect. I don't see this on the list but could be  4. Refilled Omeprazole, consider tapering off of this slowly in the future if interested  5. Refilled cholesterol pill  6. Side pain is most likely muscular, try heating pad, muscle rub, and avoid extreme movements. Do not feel a hernia or other complication  Labs today - including PSA - make sure you share this result with Dr Tresa Moore  Referral placed to GI for colonoscopy, call them if you haven't heard an appointment in 2 weeks or so  [facility] (GI) [address] [place], [place] [address] Phone: [phone]  Lucilla Lame, MD Jonathon Bellows, MD (accepting new patients)  Please schedule a follow-up appointment with Dr. Parks Ranger in 6 months for HTN  If you have any other questions or concerns, please feel free to call the clinic or send a message through [place]. You may also schedule an earlier appointment if necessary.  [name], DO [place]

## 2016-05-22 NOTE — Assessment & Plan Note (Signed)
No recent labs, reportedly controlled Check fasting lipids today Continue statin

## 2016-05-22 NOTE — Assessment & Plan Note (Signed)
Stable, controlled on PPI Has not tried tapering off, discussed this option Refilled for now

## 2016-05-22 NOTE — Assessment & Plan Note (Addendum)
Stable, on Flomax and Finasteride Followed by [facility], advised to follow-up with Dr Tresa Moore for PSA monitoring and BPH. Perhaps nipple soreness is related to finasteride start

## 2016-05-22 NOTE — Assessment & Plan Note (Signed)
Stable, mild elevated today, improved on re-check. Controlled without medication at this time. Without known complication  Plan: 1. Discussion on dx and management of HTN and chronic complications, agree to continue for now off anti-HTN meds, but likely if still mild elevated BP in 130s/80s high readings can restart low dose 2. Encourage improve lifestyle exercise, diet 3. Follow-up

## 2016-05-22 NOTE — Progress Notes (Signed)
Subjective:    Patient ID: Curtis Gregory, male    DOB: 1950/08/08, 66 y.o.   MRN: [id number]  Curtis Gregory is a 66 y.o. male presenting on 05/22/2016 for Annual Exam   HPI   CHRONIC HTN: Reports that his prior doctor took him off BP medication in past and his "high blood pressure was resolved" due to having some low BP in past. Current Meds - none   Lifestyle - whole household limiting portion size, reduced fried foods, now joined gym in town, goes 2x weekly with treadmill cardio 30 min and core strengthening, walk once weekly with wife - Weight down 10 lbs in 1 year Denies CP, dyspnea, HA, edema, dizziness / lightheadedness  Environmental and Seasonal Allergies - Chronic problem, worst season is Spring, admits some intermittent sinus congestion and pressure - Has Flonase, not using regularly - Not taking anti-histamine  GERD: - Chronic history of heartburn, well controlled on PPI, request refill  HYPERLIPIDEMIA: - Reports no concerns, has been on statin for several years without problem. Last lipid panel several years ago, unavailable to me at this time - Currently taking Rosuvastatin 20mg  nightly, tolerating well without side effects or myalgias - Does admit some knee aching but has history of arthritis  Additional complaint with nipple soreness bilateral, sometimes itching, no rash or discharge. Thinks this may be present for   Health Maintenance: - No known prostate cancer. History of Father with prostate cancer. Brothers have enlarge prostate. He has BPH, followed by [facility] Dr Tresa Moore, last seen 05/2015, followed with PSA - Last colonoscopy in 2014, 1st one, in [place], does not know oreport but states he had some polyps, unsure when to return, but will like referral now to [place]  Past Medical History:  Diagnosis Date  . Asthma   . Benign prostatic hypertrophy with urinary frequency 06/16/2014  . Essential (primary) hypertension 06/16/2014  . Excessive urination at night 06/16/2014    . GERD (gastroesophageal reflux disease)   . Glaucoma   . Hyperlipemia   . Sleep apnea    Past Surgical History:  Procedure Laterality Date  . none     Social History   Social History  . Marital status: Married    Spouse name: N/A  . Number of children: N/A  . Years of education: N/A   Occupational History  . Not on file.   Social History Main Topics  . Smoking status: Never Smoker  . Smokeless tobacco: Never Used  . Alcohol use No     Comment: occasional  . Drug use: No  . Sexual activity: Not Currently   Other Topics Concern  . Not on file   Social History Narrative  . No narrative on file   Family History  Problem Relation Age of Onset  . Diabetes Mother   . Heart disease Mother   . Alzheimer's disease Father   . Prostate cancer Father   . Kidney cancer Neg Hx    Current Outpatient Prescriptions on File Prior to Visit  Medication Sig  . albuterol (PROVENTIL HFA;VENTOLIN HFA) 108 (90 BASE) MCG/ACT inhaler Inhale into the lungs. Reported on 06/04/2015  . finasteride (PROSCAR) 5 MG tablet Take 1 tablet (5 mg total) by mouth daily.  . fluticasone (FLONASE) 50 MCG/ACT nasal spray Place 2 sprays into both nostrils daily.  . hydrocortisone (ANUSOL-HC) 25 MG suppository Place 1 suppository (25 mg total) rectally 2 (two) times daily. For 6 days.  [name] Kitchen ipratropium (ATROVENT) 0.06 % nasal spray Place 2  sprays into both nostrils 4 (four) times daily. For up to 5-7 days then stop.  [name] Kitchen latanoprost (XALATAN) 0.005 % ophthalmic solution INSTILL ONE DROP IN BOTH EYES QHS.  [name] Kitchen tamsulosin (FLOMAX) 0.4 MG CAPS capsule TAKE 2 CAPSULES BY MOUTH EVERY MORNING AT THE SAME TIME EACH DAY.   No current facility-administered medications on file prior to visit.     Review of Systems  Constitutional: Negative for activity change, appetite change, chills, diaphoresis, fatigue, fever and unexpected weight change.  HENT: Negative for congestion, hearing loss and sinus pressure.   Eyes:  Negative for visual disturbance.  Respiratory: Negative for cough, chest tightness, shortness of breath and wheezing.   Cardiovascular: Negative for chest pain, palpitations and leg swelling.  Gastrointestinal: Negative for abdominal pain, anal bleeding, blood in stool, constipation, diarrhea, nausea and vomiting.       Heartburn, controlled on PPI  Endocrine: Negative for cold intolerance and polyuria.  Genitourinary: Negative for decreased urine volume, difficulty urinating, dysuria, frequency and hematuria.  Musculoskeletal: Negative for arthralgias, back pain and neck pain.       Left lower abdominal discomfort with movements  Skin: Negative for rash.       Nipple soreness  Allergic/Immunologic: Positive for environmental allergies.  Neurological: Negative for dizziness, weakness, light-headedness, numbness and headaches.  Hematological: Negative for adenopathy.  Psychiatric/Behavioral: Negative for behavioral problems, decreased concentration, dysphoric mood and sleep disturbance. The patient is not nervous/anxious.    Per HPI unless specifically indicated above     Objective:    BP 134/82 (BP Location: Left Arm, Cuff Size: Normal)   Pulse 60   Temp 98.2 F (36.8 C) (Oral)   Resp 16   Ht 5' 8.5" (1.74 m)   Wt 224 lb (101.6 kg)   BMI 33.56 kg/m   Wt Readings from Last 3 Encounters:  05/22/16 224 lb (101.6 kg)  11/23/15 232 lb (105.2 kg)  06/04/15 234 lb (106.1 kg)    Physical Exam  Constitutional: He is oriented to person, place, and time. He appears well-developed and well-nourished. No distress.  Well-appearing, comfortable, cooperative  HENT:  Head: Normocephalic and atraumatic.  Frontal / maxillary sinuses non-tender. Nares patent without purulence or edema. Oropharynx clear without erythema, exudates, edema or asymmetry.  Eyes: Conjunctivae and EOM are normal. Pupils are equal, round, and reactive to light.  Neck: Normal range of motion. Neck supple. No thyromegaly  present.  No carotid bruits  Cardiovascular: Regular rhythm, normal heart sounds and intact distal pulses.   No murmur heard. Pulmonary/Chest: Effort normal and breath sounds normal. No respiratory distress. He has no wheezes. He has no rales.  Abdominal: Soft. Bowel sounds are normal. He exhibits no distension and no mass. There is tenderness (Mild soreness Left lower mid abdominal wall). There is no rebound.  No evidence of herniation or mass  Musculoskeletal: Normal range of motion. He exhibits no edema or tenderness.  Lymphadenopathy:    He has no cervical adenopathy.  Neurological: He is alert and oriented to person, place, and time.  Skin: Skin is warm and dry. No rash noted. He is not diaphoretic. No erythema.  Psychiatric: He has a normal mood and affect. His behavior is normal.  Well groomed, good eye contact, normal speech and thoughts  Nursing note and vitals reviewed.       Assessment & Plan:   Problem List Items Addressed This Visit    Hyperlipidemia    No recent labs, reportedly controlled Check fasting lipids today Continue  statin      Relevant Medications   rosuvastatin (CRESTOR) 20 MG tablet   Other Relevant Orders   Lipid panel   GERD (gastroesophageal reflux disease)    Stable, controlled on PPI Has not tried tapering off, discussed this option Refilled for now      Relevant Medications   omeprazole (PRILOSEC) 20 MG capsule   Essential hypertension    Stable, mild elevated today, improved on re-check. Controlled without medication at this time. Without known complication  Plan: 1. Discussion on dx and management of HTN and chronic complications, agree to continue for now off anti-HTN meds, but likely if still mild elevated BP in 130s/80s high readings can restart low dose 2. Encourage improve lifestyle exercise, diet 3. Follow-up      Relevant Medications   rosuvastatin (CRESTOR) 20 MG tablet   Other Relevant Orders   COMPLETE METABOLIC PANEL  WITH GFR   Benign prostatic hyperplasia with urinary frequency    Stable, on Flomax and Finasteride Followed by [facility], advised to follow-up with Dr Tresa Moore for PSA monitoring and BPH. Perhaps nipple soreness is related to finasteride start      Relevant Orders   PSA, Total with Reflex to PSA, Free    Other Visit Diagnoses    Annual physical exam    -  Primary   Relevant Orders   COMPLETE METABOLIC PANEL WITH GFR   Hemoglobin A1c   CBC with Differential/Platelet   Lipid panel   PSA, Total with Reflex to PSA, Free   Screening for HIV (human immunodeficiency virus)       Relevant Orders   HIV antibody   Need for hepatitis C screening test       Relevant Orders   Hepatitis C antibody   Screening for prostate cancer       Relevant Orders   PSA, Total with Reflex to PSA, Free   Abnormal glucose       Relevant Orders   Hemoglobin A1c   History of colon polyps       Relevant Orders   Ambulatory referral to Gastroenterology   Screening for colon cancer       Relevant Orders   Ambulatory referral to Gastroenterology      Meds ordered this encounter  Medications  .           .           . rosuvastatin (CRESTOR) 20 MG tablet    Sig: Take 1 tablet (20 mg total) by mouth at bedtime.    Dispense:  90 tablet    Refill:  3  . omeprazole (PRILOSEC) 20 MG capsule    Sig: Take 1 capsule (20 mg total) by mouth daily.    Dispense:  90 capsule    Refill:  3     Follow up plan: Return in about 6 months (around 11/22/2016) for HTN.  [name], DO [facility] 05/22/2016, 3:49 PM

## 2016-05-23 LAB — COMPLETE METABOLIC PANEL WITH GFR
ALBUMIN: 3.6 g/dL (ref 3.6–5.1)
ALK PHOS: 53 U/L (ref 40–115)
ALT: 13 U/L (ref 9–46)
AST: 21 U/L (ref 10–35)
BILIRUBIN TOTAL: 0.7 mg/dL (ref 0.2–1.2)
BUN: 17 mg/dL (ref 7–25)
CO2: 25 mmol/L (ref 20–31)
Calcium: 8.3 mg/dL — ABNORMAL LOW (ref 8.6–10.3)
Chloride: 107 mmol/L (ref 98–110)
Creat: 1.15 mg/dL (ref 0.70–1.25)
GFR, Est African American: 77 mL/min (ref >=60)
GFR, Est Non African American: 67 mL/min (ref >=60)
Glucose, Bld: 96 mg/dL (ref 65–99)
Potassium: 4.4 mmol/L (ref 3.5–5.3)
Sodium: 141 mmol/L (ref 135–146)
Total Protein: 6.2 g/dL (ref 6.1–8.1)

## 2016-05-23 LAB — LIPID PANEL
Cholesterol: 152 mg/dL (ref <200)
HDL: 47 mg/dL (ref >40)
LDL Cholesterol: 95 mg/dL (ref <100)
Total CHOL/HDL Ratio: 3.2 Ratio (ref <5.0)
Triglycerides: 52 mg/dL (ref <150)
VLDL: 10 mg/dL (ref <30)

## 2016-05-23 LAB — PSA, TOTAL WITH REFLEX TO PSA, FREE: PSA, Total: 0.4 ng/mL (ref <=4.0)

## 2016-05-23 LAB — HEMOGLOBIN A1C
Hgb A1c MFr Bld: 5.6 % (ref <5.7)
Mean Plasma Glucose: 114 mg/dL

## 2016-05-23 LAB — HEPATITIS C ANTIBODY: HCV AB: NEGATIVE

## 2016-05-23 LAB — HIV ANTIBODY (ROUTINE TESTING W REFLEX): HIV: NONREACTIVE

## 2016-06-03 ENCOUNTER — Ambulatory Visit (INDEPENDENT_AMBULATORY_CARE_PROVIDER_SITE_OTHER): Payer: Managed Care, Other (non HMO) | Admitting: Family Medicine

## 2016-06-03 ENCOUNTER — Encounter: Payer: Self-pay | Admitting: Family Medicine

## 2016-06-03 VITALS — BP 150/80 | HR 77 | Temp 98.7°F | Resp 16 | Ht 68.5 in | Wt 228.6 lb

## 2016-06-03 DIAGNOSIS — J209 Acute bronchitis, unspecified: Secondary | ICD-10-CM

## 2016-06-03 MED ORDER — FEXOFENADINE HCL 180 MG PO TABS: 180.0000 mg | ORAL_TABLET | Freq: Every day | ORAL | Status: DC

## 2016-06-03 MED ORDER — AZITHROMYCIN 250 MG PO TABS: ORAL_TABLET | ORAL | 0 refills | Status: DC

## 2016-06-03 NOTE — Progress Notes (Signed)
Subjective:    Patient ID: Curtis Gregory, male    DOB: Mar 20, 1950, 66 y.o.   MRN: [id number]  Curtis Gregory is a 66 y.o. male presenting on 06/03/2016 for Cough (nasal congestion onset 4 days yellowish mucus sore throat no chills or fever)  Patient presents for a same day appointment.  HPI  ACUTE BRONCHITIS / ALLERGIC RHINOSINUSITIS Reports symptoms started about 4 days ago with worsening progressive sinus and nasal congestion, with productive cough with thicker productive sputum, worse at night, did have waking up episode overnight with difficulty catching breathing - He tried NyQuil overnight - Recently seen 05/22/16 for annual physical, with known seasonal allergies in Spring, has resumed the Allegra 141m OTC daily for >2 weeks now and has resumed using Flonase 2 sprays each nostril daily - No known specific sick contact, he has been out of town for past few days. Last episode with similar bronchitis illness about 6 months ago, usually has in winter not Spring. - Denies any fevers/chills, sweats, nausea, vomiting, muscle aches  Social History  Substance Use Topics  . Smoking status: Never Smoker  . Smokeless tobacco: Never Used  . Alcohol use No     Comment: occasional    Review of Systems Per HPI unless specifically indicated above     Objective:    BP (!) 150/80   Pulse 77   Temp 98.7 F (37.1 C) (Oral)   Resp 16   Ht 5' 8.5" (1.74 m)   Wt 228 lb 9.6 oz (103.7 kg)   SpO2 99%   BMI 34.25 kg/m   Wt Readings from Last 3 Encounters:  06/03/16 228 lb 9.6 oz (103.7 kg)  05/22/16 224 lb (101.6 kg)  11/23/15 232 lb (105.2 kg)    Physical Exam  Constitutional: He is oriented to person, place, and time. He appears well-developed and well-nourished. No distress.  Mostly well-appearing non toxic, some tired appearance and minor sick appearance with congestion, cooperative  HENT:  Head: Normocephalic and atraumatic.  Mouth/Throat: Oropharynx is clear and moist.  Frontal /  maxillary sinuses non-tender. Nares with some turbinate edema and congestion without purulence. Bilateral TMs clear without erythema, effusion or bulging. Oropharynx mild posterior pharyngeal congestion without erythema, exudates, edema or asymmetry.  Eyes: Conjunctivae are normal. Right eye exhibits no discharge. Left eye exhibits no discharge.  Neck: Normal range of motion. Neck supple. No thyromegaly present.  Cardiovascular: Normal rate, regular rhythm, normal heart sounds and intact distal pulses.   No murmur heard. Pulmonary/Chest: Effort normal. No respiratory distress. He has no wheezes. He has no rales.  Some mild coarse breath sounds with transmitted upper airway sounds. Overall good air movement mostly preserved. Speaks full sentences. Occasional coughing. No overt wheezing or focal crackles  Musculoskeletal: He exhibits no edema.  Lymphadenopathy:    He has no cervical adenopathy.  Neurological: He is alert and oriented to person, place, and time.  Skin: Skin is warm and dry. No rash noted. He is not diaphoretic. No erythema.  Psychiatric: His behavior is normal.  Nursing note and vitals reviewed.  Results for orders placed or performed in visit on 05/22/16  COMPLETE METABOLIC PANEL WITH GFR  Result Value Ref Range   Sodium 141 135 - 146 mmol/L   Potassium 4.4 3.5 - 5.3 mmol/L   Chloride 107 98 - 110 mmol/L   CO2 25 20 - 31 mmol/L   Glucose, Bld 96 65 - 99 mg/dL   BUN 17 7 - 25 mg/dL  Creat 1.15 0.70 - 1.25 mg/dL   Total Bilirubin 0.7 0.2 - 1.2 mg/dL   Alkaline Phosphatase 53 40 - 115 U/L   AST 21 10 - 35 U/L   ALT 13 9 - 46 U/L   Total Protein 6.2 6.1 - 8.1 g/dL   Albumin 3.6 3.6 - 5.1 g/dL   Calcium 8.3 (L) 8.6 - 10.3 mg/dL   GFR, Est African American 77 >=60 mL/min   GFR, Est Non African American 67 >=60 mL/min  Hemoglobin A1c  Result Value Ref Range   Hgb A1c MFr Bld 5.6 <5.7 %   Mean Plasma Glucose 114 mg/dL  CBC with Differential/Platelet  Result Value Ref  Range   WBC 4.1 3.8 - 10.8 K/uL   RBC 4.83 4.20 - 5.80 MIL/uL   Hemoglobin 14.7 13.2 - 17.1 g/dL   HCT 44.4 38.5 - 50.0 %   MCV 91.9 80.0 - 100.0 fL   MCH 30.4 27.0 - 33.0 pg   MCHC 33.1 32.0 - 36.0 g/dL   RDW 13.8 11.0 - 15.0 %   Platelets 225 140 - 400 K/uL   MPV 8.8 7.5 - 12.5 fL   Neutro Abs 1,476 (L) 1,500 - 7,800 cells/uL   Lymphs Abs 2,009 850 - 3,900 cells/uL   Monocytes Absolute 369 200 - 950 cells/uL   Eosinophils Absolute 205 15 - 500 cells/uL   Basophils Absolute 41 0 - 200 cells/uL   Neutrophils Relative % 36 %   Lymphocytes Relative 49 %   Monocytes Relative 9 %   Eosinophils Relative 5 %   Basophils Relative 1 %   Smear Review Criteria for review not met   Lipid panel  Result Value Ref Range   Cholesterol 152 <200 mg/dL   Triglycerides 52 <150 mg/dL   HDL 47 >40 mg/dL   Total CHOL/HDL Ratio 3.2 <5.0 Ratio   VLDL 10 <30 mg/dL   LDL Cholesterol 95 <100 mg/dL  PSA, Total with Reflex to PSA, Free  Result Value Ref Range   PSA, Total 0.4 <=4.0 ng/mL  Hepatitis C antibody  Result Value Ref Range   HCV Ab NEGATIVE NEGATIVE  HIV antibody  Result Value Ref Range   HIV 1&2 Ab, 4th Generation NONREACTIVE NONREACTIVE      Assessment & Plan:   Problem List Items Addressed This Visit    None    Visit Diagnoses    Acute bronchitis, unspecified organism    -  Primary  Consistent with worsening bronchitis in setting of likely viral URI vs knownallergic rhinitis component. Concern with duration almost 1 week - Afebrile, no focal signs of infection (not consistent with pneumonia by history or exam), no evidence sinusitis. Mild exp coarse breath sounds on exam concern for some bronchospasm with known history of intermittent asthma  Plan: 1. Mutual agreement to start Azithromycin Z-pak dosing 577m then 2590mdaily x 4 days 2. Hold prednisone not quite asthma flare 3. Switch to Mucinex-DM up to 1 week, may use existing rx codeine cough syrup PRN, no new rx provided,  declined tessalon perls (not effective in past) 4. Continue Allegra, Flonase 5. Use existing Atrovent nasal spray decongestant 2 sprays in each nostril up to 4 times daily for 7 days 6. If worsening asthma symptoms, start Albuterol inhaler 2 puffs q 4-6 hour x 3-5 days regularly 7. Tylenol/Ibuprofen PRN, Nasal saline, lozenges, tea with honey/lemon 8. Considered CXR, deferred for now. Return criteria reviewed, follow-up within 1 week if not improved  Relevant Medications   azithromycin (ZITHROMAX Z-PAK) 250 MG tablet      Meds ordered this encounter  Medications  . fexofenadine (ALLEGRA ALLERGY) 180 MG tablet    Sig: Take 1 tablet (180 mg total) by mouth daily.  [name] Kitchen azithromycin (ZITHROMAX Z-PAK) 250 MG tablet    Sig: Take 2 tabs (570m total) on Day 1. Take 1 tab (2572m daily for next 4 days.    Dispense:  6 tablet    Refill:  0      Follow up plan: Return in about 2 weeks (around 06/17/2016), or if symptoms worsen or fail to improve, for bronchitis.  AlNobie PutnamDO [facility] 06/03/2016, 11:28 PM

## 2016-06-03 NOTE — Addendum Note (Signed)
Addended by: Olin Hauser on: 06/03/2016 11:39 PM   Modules accepted: Level of Service

## 2016-06-03 NOTE — Patient Instructions (Signed)
Thank you for coming to the clinic today.  1. It sounds like you had an Upper Respiratory Virus that has settled into a Bronchitis, lower respiratory tract infection. I don't have concerns for pneumonia today, and think that this should gradually improve. Once you are feeling better, the cough may take a few weeks to fully resolve. I do hear some coarse breath sounds, this may be due to the virus, could be asthma related   start Azithromycin Z pak (antibiotic) 2 tabs day 1, then 1 tab x 4 days, complete entire course even if improved  Start Atrovent nasal spray decongestant 2 sprays in each nostril up to 4 times daily for 7 days  Continue Allegra once daily and Flonase 2 sprays each nostril daily  Use Albuterol inhaler 2 puffs every 4-6 hours around the clock for next 2-3 days, max up to 5 days then use as needed  May use prior cough syrup from old rx  If want other cough medicine, can try  OTC Mucinex-DM for 1 week or less, to help clear the mucus - Use nasal saline (Simply Saline or Ocean Spray) to flush nasal congestion multiple times a day, may help cough - Drink plenty of fluids to improve congestion  If your symptoms seem to worsen instead of improve over next several days, including significant fever / chills, worsening shortness of breath, worsening wheezing, or nausea / vomiting and can't take medicines - return sooner or go to hospital Emergency Department for more immediate treatment.  Please schedule a follow-up appointment with Dr. Parks Ranger in 1-2 weeks bronchitis if not improved  If you have any other questions or concerns, please feel free to call the clinic or send a message through [place]. You may also schedule an earlier appointment if necessary.  [name], DO [facility]

## 2016-06-19 ENCOUNTER — Ambulatory Visit: Payer: Managed Care, Other (non HMO) | Admitting: Family Medicine

## 2016-06-23 DIAGNOSIS — J301 Allergic rhinitis due to pollen: Secondary | ICD-10-CM | POA: Diagnosis not present

## 2016-06-23 DIAGNOSIS — R0683 Snoring: Secondary | ICD-10-CM | POA: Diagnosis not present

## 2016-06-23 DIAGNOSIS — K219 Gastro-esophageal reflux disease without esophagitis: Secondary | ICD-10-CM | POA: Diagnosis not present

## 2016-07-22 ENCOUNTER — Other Ambulatory Visit: Payer: Self-pay

## 2016-07-22 DIAGNOSIS — R350 Frequency of micturition: Principal | ICD-10-CM

## 2016-07-22 DIAGNOSIS — N401 Enlarged prostate with lower urinary tract symptoms: Secondary | ICD-10-CM

## 2016-07-22 MED ORDER — TAMSULOSIN HCL 0.4 MG PO CAPS: ORAL_CAPSULE | ORAL | 6 refills | Status: DC

## 2016-07-22 NOTE — Telephone Encounter (Signed)
Pt currently followed by Dr. Tresa Moore at [facility]. Please advise

## 2016-07-24 ENCOUNTER — Other Ambulatory Visit: Payer: Self-pay

## 2016-07-30 ENCOUNTER — Other Ambulatory Visit: Payer: Self-pay | Admitting: Family Medicine

## 2016-07-30 MED ORDER — FINASTERIDE 5 MG PO TABS: 5.0000 mg | ORAL_TABLET | Freq: Every day | ORAL | 0 refills | Status: DC

## 2016-08-13 ENCOUNTER — Ambulatory Visit: Payer: Medicare Other | Attending: Otolaryngology

## 2016-08-13 DIAGNOSIS — G4761 Periodic limb movement disorder: Secondary | ICD-10-CM | POA: Diagnosis not present

## 2016-08-13 DIAGNOSIS — G4733 Obstructive sleep apnea (adult) (pediatric): Secondary | ICD-10-CM | POA: Insufficient documentation

## 2016-08-14 ENCOUNTER — Ambulatory Visit (INDEPENDENT_AMBULATORY_CARE_PROVIDER_SITE_OTHER): Payer: Medicare Other | Admitting: Urology

## 2016-08-14 ENCOUNTER — Encounter: Payer: Self-pay | Admitting: Urology

## 2016-08-14 ENCOUNTER — Telehealth: Payer: Self-pay | Admitting: Gastroenterology

## 2016-08-14 VITALS — BP 145/73 | HR 71 | Ht 68.5 in | Wt 225.8 lb

## 2016-08-14 DIAGNOSIS — N401 Enlarged prostate with lower urinary tract symptoms: Secondary | ICD-10-CM | POA: Diagnosis not present

## 2016-08-14 DIAGNOSIS — N400 Enlarged prostate without lower urinary tract symptoms: Secondary | ICD-10-CM

## 2016-08-14 DIAGNOSIS — Z125 Encounter for screening for malignant neoplasm of prostate: Secondary | ICD-10-CM

## 2016-08-14 DIAGNOSIS — R350 Frequency of micturition: Secondary | ICD-10-CM | POA: Diagnosis not present

## 2016-08-14 MED ORDER — FINASTERIDE 5 MG PO TABS: 5.0000 mg | ORAL_TABLET | Freq: Every day | ORAL | 11 refills | Status: DC

## 2016-08-14 MED ORDER — TAMSULOSIN HCL 0.4 MG PO CAPS: ORAL_CAPSULE | ORAL | 11 refills | Status: DC

## 2016-08-14 NOTE — Progress Notes (Signed)
08/14/2016 4:16 PM   Curtis Gregory [birth year] [id number]  Referring provider: Olin Hauser, DO [address] [place], [place] [address]  Chief Complaint  Patient presents with  . Medication Refill    HPI: 1 - Enlarged Prostate with Lower Urinary Tract Symptoms - slowly progressive bother from mix of irritative and obstrucive symptoms with hesitancy, freqeuncy, weak stream, nocturia. No h/o Gu trauma or recurrent STI. No incontinence.Given trial of tamsulsoin up to BID by PCP and with some improvement but still sig bother. So started on finasteride one year ago.  2 - Prostate Cancer Screening - pt's  Father/Brother with prostate cancer. 05/2015 PSA 0.7  / DRE 70gm smooth.  (prior to finasteride) 05/2016 PSA 0.4    PMH: Past Medical History:  Diagnosis Date  . Asthma   . Benign prostatic hypertrophy with urinary frequency 06/16/2014  . Essential (primary) hypertension 06/16/2014  . Excessive urination at night 06/16/2014  . GERD (gastroesophageal reflux disease)   . Glaucoma   . Hyperlipemia   . Sleep apnea     Surgical History: Past Surgical History:  Procedure Laterality Date  . none      Home Medications:  Allergies as of 08/14/2016   No Known Allergies     Medication List       Accurate as of 08/14/16  4:16 PM. Always use your most recent med list.          albuterol 108 (90 Base) MCG/ACT inhaler Commonly known as:  PROVENTIL HFA;VENTOLIN HFA Inhale into the lungs. Reported on 06/04/2015   fexofenadine 180 MG tablet Commonly known as:  ALLEGRA ALLERGY Take 1 tablet (180 mg total) by mouth daily.   finasteride 5 MG tablet Commonly known as:  PROSCAR Take 1 tablet (5 mg total) by mouth daily.   fluticasone 50 MCG/ACT nasal spray Commonly known as:  FLONASE Place 2 sprays into both nostrils daily.   hydrocortisone 25 MG suppository Commonly known as:  ANUSOL-HC Place 1 suppository (25 mg total) rectally 2 (two) times daily. For 6 days.   ipratropium  0.06 % nasal spray Commonly known as:  ATROVENT Place 2 sprays into both nostrils 4 (four) times daily. For up to 5-7 days then stop.   latanoprost 0.005 % ophthalmic solution Commonly known as:  XALATAN INSTILL ONE DROP IN BOTH EYES QHS.   LOTEMAX 0.5 % ophthalmic suspension Generic drug:  loteprednol INSTILL 1 DROP IN OS QID   omeprazole 20 MG capsule Commonly known as:  PRILOSEC Take 1 capsule (20 mg total) by mouth daily.   rosuvastatin 20 MG tablet Commonly known as:  CRESTOR Take 1 tablet (20 mg total) by mouth at bedtime.   tamsulosin 0.4 MG Caps capsule Commonly known as:  FLOMAX TAKE 2 CAPSULES BY MOUTH EVERY MORNING AT THE SAME TIME EACH DAY.   timolol 0.5 % ophthalmic gel-forming Commonly known as:  TIMOPTIC-XR INT 1 GTT INTO OS QD       Allergies: No Known Allergies  Family History: Family History  Problem Relation Age of Onset  . Diabetes Mother   . Heart disease Mother   . Alzheimer's disease Father   . Prostate cancer Father   . Kidney cancer Neg Hx     Social History:  reports that he has never smoked. He has never used smokeless tobacco. He reports that he does not drink alcohol or use drugs.  ROS: UROLOGY Frequent Urination?: No Hard to postpone urination?: No Burning/pain with urination?: No Get up  at night to urinate?: No Leakage of urine?: No Urine stream starts and stops?: No Trouble starting stream?: No Do you have to strain to urinate?: No Blood in urine?: No Urinary tract infection?: No Sexually transmitted disease?: No Injury to kidneys or bladder?: No Painful intercourse?: No Weak stream?: No Erection problems?: No Penile pain?: No  Gastrointestinal Nausea?: No Vomiting?: No Indigestion/heartburn?: Yes Diarrhea?: No Constipation?: No  Constitutional Fever: No Night sweats?: No Weight loss?: No Fatigue?: No  Skin Skin rash/lesions?: No Itching?: No  Eyes Blurred vision?: No Double vision?:  No  Ears/Nose/Throat Sore throat?: No Sinus problems?: No  Hematologic/Lymphatic Swollen glands?: No Easy bruising?: No  Cardiovascular Leg swelling?: No Chest pain?: No  Respiratory Cough?: No Shortness of breath?: No  Endocrine Excessive thirst?: No  Musculoskeletal Back pain?: No Joint pain?: No  Neurological Headaches?: No Dizziness?: No  Psychologic Depression?: No Anxiety?: No  Physical Exam: BP (!) 145/73 (BP Location: Left Arm, Patient Position: Sitting, Cuff Size: Normal)   Pulse 71   Ht 5' 8.5" (1.74 m)   Wt 225 lb 12.8 oz (102.4 kg)   BMI 33.83 kg/m   Constitutional:  Alert and oriented, No acute distress. HEENT: [place] AT, moist mucus membranes.  Trachea midline, no masses. Cardiovascular: No clubbing, cyanosis, or edema. Respiratory: Normal respiratory effort, no increased work of breathing. GI: Abdomen is soft, nontender, nondistended, no abdominal masses GU: No CVA tenderness. DRE: 2+ benign. Skin: No rashes, bruises or suspicious lesions. Lymph: No cervical or inguinal adenopathy. Neurologic: Grossly intact, no focal deficits, moving all 4 extremities. Psychiatric: Normal mood and affect.  Laboratory Data: Lab Results  Component Value Date   WBC 4.1 05/22/2016   HGB 14.7 05/22/2016   HCT 44.4 05/22/2016   MCV 91.9 05/22/2016   PLT 225 05/22/2016    Lab Results  Component Value Date   CREATININE 1.15 05/22/2016    No results found for: PSA  No results found for: TESTOSTERONE  Lab Results  Component Value Date   HGBA1C 5.6 05/22/2016    Urinalysis    Component Value Date/Time   APPEARANCEUR Clear 06/04/2015 1458   GLUCOSEU Negative 06/04/2015 1458   BILIRUBINUR Negative 06/04/2015 1458   PROTEINUR Trace (A) 06/04/2015 1458   UROBILINOGEN 0.2 06/16/2014 1206   NITRITE Negative 06/04/2015 1458   LEUKOCYTESUR Negative 06/04/2015 1458    Assessment & Plan:    1 - Enlarged Prostate with Lower Urinary Tract Symptoms -  continue finasteride/flomax 0.8 mg. Follow up annually.  2 - Prostate Cancer Screening - up to date this year, continue until age 26 or so as above average risk and with very little comorbidity.  Return in about 1 year (around 08/14/2017).  Nickie Retort, MD  [facility] [address] [place], [place] [address] [phone]

## 2016-08-14 NOTE — Telephone Encounter (Signed)
Patient received a letter in the mail to call and schedule his colonoscopy. He would like to do it next week. Please call [phone]

## 2016-08-18 ENCOUNTER — Telehealth: Payer: Self-pay

## 2016-08-18 NOTE — Telephone Encounter (Signed)
Gastroenterology Pre-Procedure Review  Request Date: 09/04/16 Requesting Physician: Dr. Allen Norris  PATIENT REVIEW QUESTIONS: The patient responded to the following health history questions as indicated:    1. Are you having any GI issues? no 2. Do you have a personal history of Polyps? yes (greater than 10 years) 3. Do you have a family history of Colon Cancer or Polyps? no 4. Diabetes Mellitus? no 5. Joint replacements in the past 12 months?no 6. Major health problems in the past 3 months?no 7. Any artificial heart valves, MVP, or defibrillator?no    MEDICATIONS & ALLERGIES:    Patient reports the following regarding taking any anticoagulation/antiplatelet therapy:   Plavix, Coumadin, Eliquis, Xarelto, Lovenox, Pradaxa, Brilinta, or Effient? no Aspirin? no  Patient confirms/reports the following medications:  Current Outpatient Prescriptions  Medication Sig Dispense Refill  . albuterol (PROVENTIL HFA;VENTOLIN HFA) 108 (90 BASE) MCG/ACT inhaler Inhale into the lungs. Reported on 06/04/2015    . fexofenadine (ALLEGRA ALLERGY) 180 MG tablet Take 1 tablet (180 mg total) by mouth daily. (Patient not taking: Reported on 08/14/2016)    . finasteride (PROSCAR) 5 MG tablet Take 1 tablet (5 mg total) by mouth daily. 30 tablet 11  . fluticasone (FLONASE) 50 MCG/ACT nasal spray Place 2 sprays into both nostrils daily. 16 g 6  . hydrocortisone (ANUSOL-HC) 25 MG suppository Place 1 suppository (25 mg total) rectally 2 (two) times daily. For 6 days. (Patient not taking: Reported on 08/14/2016) 12 suppository 0  . ipratropium (ATROVENT) 0.06 % nasal spray Place 2 sprays into both nostrils 4 (four) times daily. For up to 5-7 days then stop. (Patient not taking: Reported on 08/14/2016) 15 mL 0  . latanoprost (XALATAN) 0.005 % ophthalmic solution INSTILL ONE DROP IN BOTH EYES QHS.  0  . LOTEMAX 0.5 % ophthalmic suspension INSTILL 1 DROP IN OS QID  0  . omeprazole (PRILOSEC) 20 MG capsule Take 1 capsule (20 mg  total) by mouth daily. 90 capsule 3  . rosuvastatin (CRESTOR) 20 MG tablet Take 1 tablet (20 mg total) by mouth at bedtime. 90 tablet 3  . tamsulosin (FLOMAX) 0.4 MG CAPS capsule TAKE 2 CAPSULES BY MOUTH EVERY MORNING AT THE SAME TIME EACH DAY. 60 capsule 11  . timolol (TIMOPTIC-XR) 0.5 % ophthalmic gel-forming INT 1 GTT INTO OS QD  0   No current facility-administered medications for this visit.     Patient confirms/reports the following allergies:  No Known Allergies  No orders of the defined types were placed in this encounter.   AUTHORIZATION INFORMATION Primary Insurance: 1D#: Group #:  Secondary Insurance: 1D#: Group #:  SCHEDULE INFORMATION: Date: 09/04/16 Time: Location:[facility]

## 2016-09-04 ENCOUNTER — Telehealth: Payer: Self-pay

## 2016-09-04 ENCOUNTER — Other Ambulatory Visit: Payer: Self-pay

## 2016-09-04 DIAGNOSIS — Z1211 Encounter for screening for malignant neoplasm of colon: Secondary | ICD-10-CM

## 2016-09-04 NOTE — Telephone Encounter (Signed)
Patient contacted office to get arrival time for colonoscopy because he did not receive a phone call. Reviewing chart patients procedure was not entered.  Apologized to patient for the inconvenience offered to give him appt for tomorrow.  He accepted but then called back to request 09/11/16.  Date was available.  Patient has been asked to come by [facility] office this afternoon for a Bowel Prep Kit and new instructions.  He stated that he will come by [place] at 1:30 today.

## 2016-09-08 ENCOUNTER — Encounter: Payer: Self-pay | Admitting: *Deleted

## 2016-09-10 NOTE — Discharge Instructions (Signed)
General Anesthesia, Adult, Care After °These instructions provide you with information about caring for yourself after your procedure. Your health care provider may also give you more specific instructions. Your treatment has been planned according to current medical practices, but problems sometimes occur. Call your health care provider if you have any problems or questions after your procedure. °What can I expect after the procedure? °After the procedure, it is common to have: °· Vomiting. °· A sore throat. °· Mental slowness. ° °It is common to feel: °· Nauseous. °· Cold or shivery. °· Sleepy. °· Tired. °· Sore or achy, even in parts of your body where you did not have surgery. ° °Follow these instructions at home: °For at least 24 hours after the procedure: °· Do not: °? Participate in activities where you could fall or become injured. °? Drive. °? Use heavy machinery. °? Drink alcohol. °? Take sleeping pills or medicines that cause drowsiness. °? Make important decisions or sign legal documents. °? Take care of children on your own. °· Rest. °Eating and drinking °· If you vomit, drink water, juice, or soup when you can drink without vomiting. °· Drink enough fluid to keep your urine clear or pale yellow. °· Make sure you have little or no nausea before eating solid foods. °· Follow the diet recommended by your health care provider. °General instructions °· Have a responsible adult stay with you until you are awake and alert. °· Return to your normal activities as told by your health care provider. Ask your health care provider what activities are safe for you. °· Take over-the-counter and prescription medicines only as told by your health care provider. °· If you smoke, do not smoke without supervision. °· Keep all follow-up visits as told by your health care provider. This is important. °Contact a health care provider if: °· You continue to have nausea or vomiting at home, and medicines are not helpful. °· You  cannot drink fluids or start eating again. °· You cannot urinate after 8-12 hours. °· You develop a skin rash. °· You have fever. °· You have increasing redness at the site of your procedure. °Get help right away if: °· You have difficulty breathing. °· You have chest pain. °· You have unexpected bleeding. °· You feel that you are having a life-threatening or urgent problem. °This information is not intended to replace advice given to you by your health care provider. Make sure you discuss any questions you have with your health care provider. °Document Released: 04/07/2000 Document Revised: 06/04/2015 Document Reviewed: 12/14/2014 °Elsevier Interactive Patient Education © 2018 [employer] Inc[employer] ° °

## 2016-09-11 ENCOUNTER — Ambulatory Visit: Payer: Medicare Other | Admitting: Anesthesiology

## 2016-09-11 ENCOUNTER — Encounter
Admission: RE | Disposition: A | Discharge status: Home / Self Care | Payer: Self-pay | Source: Ambulatory Visit | Attending: Gastroenterology

## 2016-09-11 ENCOUNTER — Ambulatory Visit
Admission: RE | Admit: 2016-09-11 | Discharge: 2016-09-11 | Disposition: A | Discharge status: Home / Self Care | Payer: Medicare Other | Source: Ambulatory Visit | Attending: Gastroenterology | Admitting: Gastroenterology

## 2016-09-11 DIAGNOSIS — Z79899 Other long term (current) drug therapy: Secondary | ICD-10-CM | POA: Diagnosis not present

## 2016-09-11 DIAGNOSIS — N400 Enlarged prostate without lower urinary tract symptoms: Secondary | ICD-10-CM | POA: Insufficient documentation

## 2016-09-11 DIAGNOSIS — J45909 Unspecified asthma, uncomplicated: Secondary | ICD-10-CM | POA: Insufficient documentation

## 2016-09-11 DIAGNOSIS — Z1211 Encounter for screening for malignant neoplasm of colon: Secondary | ICD-10-CM | POA: Diagnosis not present

## 2016-09-11 DIAGNOSIS — Z833 Family history of diabetes mellitus: Secondary | ICD-10-CM | POA: Diagnosis not present

## 2016-09-11 DIAGNOSIS — K219 Gastro-esophageal reflux disease without esophagitis: Secondary | ICD-10-CM | POA: Diagnosis not present

## 2016-09-11 DIAGNOSIS — Z8249 Family history of ischemic heart disease and other diseases of the circulatory system: Secondary | ICD-10-CM | POA: Insufficient documentation

## 2016-09-11 DIAGNOSIS — K641 Second degree hemorrhoids: Secondary | ICD-10-CM | POA: Diagnosis not present

## 2016-09-11 DIAGNOSIS — I10 Essential (primary) hypertension: Secondary | ICD-10-CM | POA: Insufficient documentation

## 2016-09-11 DIAGNOSIS — D124 Benign neoplasm of descending colon: Secondary | ICD-10-CM | POA: Diagnosis not present

## 2016-09-11 DIAGNOSIS — H409 Unspecified glaucoma: Secondary | ICD-10-CM | POA: Insufficient documentation

## 2016-09-11 DIAGNOSIS — Z8042 Family history of malignant neoplasm of prostate: Secondary | ICD-10-CM | POA: Diagnosis not present

## 2016-09-11 DIAGNOSIS — G4730 Sleep apnea, unspecified: Secondary | ICD-10-CM | POA: Diagnosis not present

## 2016-09-11 DIAGNOSIS — E785 Hyperlipidemia, unspecified: Secondary | ICD-10-CM | POA: Diagnosis not present

## 2016-09-11 HISTORY — PX: COLONOSCOPY WITH PROPOFOL: SHX5780

## 2016-09-11 HISTORY — PX: POLYPECTOMY: SHX5525

## 2016-09-11 SURGERY — COLONOSCOPY WITH PROPOFOL
Anesthesia: General | Site: Rectum | Wound class: Contaminated

## 2016-09-11 MED ORDER — LIDOCAINE HCL (CARDIAC) 20 MG/ML IV SOLN
INTRAVENOUS | Status: DC | PRN
  Administered 2016-09-11: 50 mg via INTRAVENOUS

## 2016-09-11 MED ORDER — ACETAMINOPHEN 325 MG PO TABS: 650.0000 mg | ORAL_TABLET | Freq: Once | ORAL | Status: DC | PRN

## 2016-09-11 MED ORDER — SODIUM CHLORIDE 0.9 % IV SOLN: INTRAVENOUS | Status: DC

## 2016-09-11 MED ORDER — LABETALOL HCL 5 MG/ML IV SOLN
INTRAVENOUS | Status: DC | PRN
  Administered 2016-09-11 (×2): 5 mg via INTRAVENOUS

## 2016-09-11 MED ORDER — PROPOFOL 10 MG/ML IV BOLUS
INTRAVENOUS | Status: DC | PRN
  Administered 2016-09-11 (×5): 20 mg via INTRAVENOUS
  Administered 2016-09-11: 70 mg via INTRAVENOUS
  Administered 2016-09-11 (×3): 20 mg via INTRAVENOUS
  Administered 2016-09-11: 10 mg via INTRAVENOUS
  Administered 2016-09-11 (×2): 20 mg via INTRAVENOUS

## 2016-09-11 MED ORDER — ACETAMINOPHEN 160 MG/5ML PO SOLN
325.0000 mg | ORAL | Status: DC | PRN
Start: 2016-09-11 — End: 2016-09-11

## 2016-09-11 MED ORDER — ONDANSETRON HCL 4 MG/2ML IJ SOLN: 4.0000 mg | Freq: Once | INTRAMUSCULAR | Status: DC | PRN

## 2016-09-11 MED ORDER — STERILE WATER FOR IRRIGATION IR SOLN
Status: DC | PRN
  Administered 2016-09-11: 09:00:00

## 2016-09-11 MED ORDER — LACTATED RINGERS IV SOLN
INTRAVENOUS | Status: DC
  Administered 2016-09-11: 08:00:00 via INTRAVENOUS

## 2016-09-11 SURGICAL SUPPLY — 23 items

## 2016-09-11 NOTE — Anesthesia Preprocedure Evaluation (Signed)
Anesthesia Evaluation  Patient identified by MRN, date of birth, ID band Patient awake    Reviewed: Allergy & Precautions, NPO status , Patient's Chart, lab work & pertinent test results  History of Anesthesia Complications Negative for: history of anesthetic complications  Airway Mallampati: IV  TM Distance: >3 FB Neck ROM: Full    Dental  (+)    Pulmonary asthma , sleep apnea ,    Pulmonary exam normal breath sounds clear to auscultation       Cardiovascular Exercise Tolerance: Good hypertension, Normal cardiovascular exam Rhythm:Regular Rate:Normal     Neuro/Psych Seizures - (x1 as a child; not on medication),     GI/Hepatic GERD  ,  Endo/Other  negative endocrine ROS  Renal/GU negative Renal ROS     Musculoskeletal   Abdominal   Peds  Hematology negative hematology ROS (+)   Anesthesia Other Findings BPH  Reproductive/Obstetrics                             Anesthesia Physical Anesthesia Plan  ASA: II  Anesthesia Plan: General   Post-op Pain Management:    Induction: Intravenous  PONV Risk Score and Plan: 1 and Ondansetron and Propofol infusion  Airway Management Planned: Natural Airway  Additional Equipment:   Intra-op Plan:   Post-operative Plan:   Informed Consent: I have reviewed the patients History and Physical, chart, labs and discussed the procedure including the risks, benefits and alternatives for the proposed anesthesia with the patient or authorized representative who has indicated his/her understanding and acceptance.     Plan Discussed with: CRNA  Anesthesia Plan Comments:         Anesthesia Quick Evaluation

## 2016-09-11 NOTE — Op Note (Signed)
[facility] Gastroenterology Patient Name: Curtis Gregory Procedure Date: 09/11/2016 8:58 AM MRN: [id number] Account #: [id number] Date of Birth: 1950-07-03 Admit Type: Outpatient Age: 66 Room: [facility] OR ROOM 01 Gender: Male Note Status: Finalized Procedure:            Colonoscopy Indications:          Screening for colorectal malignant neoplasm Providers:            Lucilla Lame MD, MD Referring MD:         Olin Hauser (Referring MD) Medicines:            Propofol per Anesthesia Complications:        No immediate complications. Procedure:            Pre-Anesthesia Assessment:                       - Prior to the procedure, a History and Physical was                        performed, and patient medications and allergies were                        reviewed. The patient's tolerance of previous                        anesthesia was also reviewed. The risks and benefits of                        the procedure and the sedation options and risks were                        discussed with the patient. All questions were                        answered, and informed consent was obtained. Prior                        Anticoagulants: The patient has taken no previous                        anticoagulant or antiplatelet agents. ASA Grade                        Assessment: II - A patient with mild systemic disease.                        After reviewing the risks and benefits, the patient was                        deemed in satisfactory condition to undergo the                        procedure.                       After obtaining informed consent, the colonoscope was                        passed under direct vision. Throughout the procedure,  the patient's blood pressure, pulse, and oxygen                        saturations were monitored continuously. The [place] [id number]) was introduced through the                         anus and advanced to the the cecum, identified by                        appendiceal orifice and ileocecal valve. The                        colonoscopy was performed without difficulty. The                        patient tolerated the procedure well. The quality of                        the bowel preparation was adequate. Findings:      The perianal and digital rectal examinations were normal.      Three sessile polyps were found in the descending colon. The polyps were       2 to 3 mm in size. These polyps were removed with a cold biopsy forceps.       Resection and retrieval were complete.      Non-bleeding internal hemorrhoids were found during retroflexion. The       hemorrhoids were Grade II (internal hemorrhoids that prolapse but reduce       spontaneously). Impression:           - Three 2 to 3 mm polyps in the descending colon,                        removed with a cold biopsy forceps. Resected and                        retrieved.                       - Non-bleeding internal hemorrhoids. Recommendation:       - Discharge patient to home.                       - Resume previous diet.                       - Continue present medications.                       - Await pathology results.                       - Repeat colonoscopy in 5 years if polyp adenoma and 10                        years if hyperplastic Procedure Code(s):    --- Professional ---                       [id number], Colonoscopy, flexible;  with biopsy, single or                        multiple Diagnosis Code(s):    --- Professional ---                       Z12.11, Encounter for screening for malignant neoplasm                        of colon                       D12.4, Benign neoplasm of descending colon CPT copyright 2016 American Medical Association. All rights reserved. The codes documented in this report are preliminary and upon coder review may  be revised to meet current compliance  requirements. Lucilla Lame MD, MD 09/11/2016 9:35:14 AM This report has been signed electronically. Number of Addenda: 0 Note Initiated On: 09/11/2016 8:58 AM Scope Withdrawal Time: 0 hours 16 minutes 16 seconds  Total Procedure Duration: 0 hours 18 minutes 46 seconds       [facility]

## 2016-09-11 NOTE — Anesthesia Procedure Notes (Signed)
Performed by: Devonta Blanford Pre-anesthesia Checklist: Patient identified, Emergency Drugs available, Suction available, Timeout performed and Patient being monitored Patient Re-evaluated:Patient Re-evaluated prior to induction Oxygen Delivery Method: Nasal cannula Placement Confirmation: positive ETCO2       

## 2016-09-11 NOTE — H&P (Signed)
Lucilla Lame, MD [facility] [address], [place], [place] [address] Phone: [phone] Fax : [phone]  Primary Care Physician:  Olin Hauser, DO Primary Gastroenterologist:  Dr. Allen Norris  Pre-Procedure History & Physical: HPI:  Curtis Gregory is a 66 y.o. male is here for a screening colonoscopy.   Past Medical History:  Diagnosis Date  . Asthma   . Benign prostatic hypertrophy with urinary frequency 06/16/2014  . Essential (primary) hypertension 06/16/2014  . Excessive urination at night 06/16/2014  . GERD (gastroesophageal reflux disease)   . Glaucoma   . Hyperlipemia   . Seizures ([place])    as child  . Sleep apnea     Past Surgical History:  Procedure Laterality Date  . none      Prior to Admission medications   Medication Sig Start Date End Date Taking? Authorizing Provider  albuterol (PROVENTIL HFA;VENTOLIN HFA) 108 (90 BASE) MCG/ACT inhaler Inhale into the lungs. Reported on 06/04/2015 12/22/13  Yes [provider]  finasteride (PROSCAR) 5 MG tablet Take 1 tablet (5 mg total) by mouth daily. 08/14/16  Yes Nickie Retort, MD  fluticasone [facility]) 50 MCG/ACT nasal spray Place 2 sprays into both nostrils daily. 04/30/15  Yes Arlis Porta., MD  latanoprost (XALATAN) 0.005 % ophthalmic solution INSTILL ONE DROP IN BOTH EYES QHS. 08/28/14  Yes [provider]  Multiple Vitamin (MULTIVITAMIN) tablet Take 1 tablet by mouth daily.   Yes [provider]  omeprazole (PRILOSEC) 20 MG capsule Take 1 capsule (20 mg total) by mouth daily. 05/22/16  Yes Karamalegos, Devonne Doughty, DO  OVER THE COUNTER MEDICATION Prime test testosterone booster   Yes [provider]  rosuvastatin (CRESTOR) 20 MG tablet Take 1 tablet (20 mg total) by mouth at bedtime. 05/22/16  Yes Karamalegos, Devonne Doughty, DO  tamsulosin (FLOMAX) 0.4 MG CAPS capsule TAKE 2 CAPSULES BY MOUTH EVERY MORNING AT THE SAME TIME EACH DAY. 08/14/16  Yes Nickie Retort, MD    timolol (TIMOPTIC-XR) 0.5 % ophthalmic gel-forming INT 1 GTT INTO OS QD 05/15/16  Yes [provider]  fexofenadine (ALLEGRA ALLERGY) 180 MG tablet Take 1 tablet (180 mg total) by mouth daily. Patient not taking: Reported on 08/14/2016 06/03/16   Olin Hauser, DO  ipratropium (ATROVENT) 0.06 % nasal spray Place 2 sprays into both nostrils 4 (four) times daily. For up to 5-7 days then stop. Patient not taking: Reported on 08/14/2016 11/23/15   Olin Hauser, DO    Allergies as of 09/04/2016  . (No Known Allergies)    Family History  Problem Relation Age of Onset  . Diabetes Mother   . Heart disease Mother   . Alzheimer's disease Father   . Prostate cancer Father   . Kidney cancer Neg Hx     Social History   Social History  . Marital status: Married    Spouse name: N/A  . Number of children: N/A  . Years of education: N/A   Occupational History  . Not on file.   Social History Main Topics  . Smoking status: Never Smoker  . Smokeless tobacco: Never Used  . Alcohol use 0.6 oz/week    1 Glasses of wine per week     Comment: occasional  . Drug use: No  . Sexual activity: Not Currently   Other Topics Concern  . Not on file   Social History Narrative  . No narrative on file    Review of Systems: See HPI, otherwise  negative ROS  Physical Exam: BP (!) 150/95   Pulse (!) 57   Temp 98.1 F (36.7 C) (Tympanic)   Resp 16   Ht 5' 8.5" (1.74 m)   Wt 226 lb (102.5 kg)   SpO2 100%   BMI 33.86 kg/m  General:   Alert,  pleasant and cooperative in NAD Head:  Normocephalic and atraumatic. Neck:  Supple; no masses or thyromegaly. Lungs:  Clear throughout to auscultation.    Heart:  Regular rate and rhythm. Abdomen:  Soft, nontender and nondistended. Normal bowel sounds, without guarding, and without rebound.   Neurologic:  Alert and  oriented x4;  grossly normal neurologically.  Impression/Plan: Curtis Gregory is now here to undergo a screening  colonoscopy.  Risks, benefits, and alternatives regarding colonoscopy have been reviewed with the patient.  Questions have been answered.  All parties agreeable.

## 2016-09-11 NOTE — Anesthesia Postprocedure Evaluation (Signed)
Anesthesia Post Note  Patient: Curtis Gregory  Procedure(s) Performed: Procedure(s) (LRB): COLONOSCOPY WITH PROPOFOL (N/A) POLYPECTOMY (N/A)  Patient location during evaluation: PACU Anesthesia Type: General Level of consciousness: awake and alert, oriented and patient cooperative Pain management: pain level controlled Vital Signs Assessment: post-procedure vital signs reviewed and stable Respiratory status: spontaneous breathing, nonlabored ventilation and respiratory function stable Cardiovascular status: blood pressure returned to baseline and stable Postop Assessment: adequate PO intake Anesthetic complications: no    Darrin Nipper

## 2016-09-11 NOTE — Transfer of Care (Signed)
Immediate Anesthesia Transfer of Care Note  Patient: Curtis Gregory  Procedure(s) Performed: Procedure(s) with comments: COLONOSCOPY WITH PROPOFOL (N/A) - sleep apnea  Patient Location: PACU  Anesthesia Type: General  Level of Consciousness: awake, alert  and patient cooperative  Airway and Oxygen Therapy: Patient Spontanous Breathing and Patient connected to supplemental oxygen  Post-op Assessment: Post-op Vital signs reviewed, Patient's Cardiovascular Status Stable, Respiratory Function Stable, Patent Airway and No signs of Nausea or vomiting  Post-op Vital Signs: Reviewed and stable  Complications: No apparent anesthesia complications

## 2016-09-12 ENCOUNTER — Encounter: Payer: Self-pay | Admitting: Gastroenterology

## 2016-09-17 ENCOUNTER — Encounter: Payer: Self-pay | Admitting: Gastroenterology

## 2016-09-24 ENCOUNTER — Ambulatory Visit: Payer: Medicare Other | Attending: Otolaryngology

## 2016-10-02 ENCOUNTER — Other Ambulatory Visit: Payer: Self-pay | Admitting: Family Medicine

## 2016-10-02 DIAGNOSIS — E785 Hyperlipidemia, unspecified: Secondary | ICD-10-CM

## 2016-10-03 ENCOUNTER — Other Ambulatory Visit: Payer: Self-pay | Admitting: Family Medicine

## 2016-10-03 DIAGNOSIS — J209 Acute bronchitis, unspecified: Secondary | ICD-10-CM

## 2016-10-06 ENCOUNTER — Encounter: Payer: Self-pay | Admitting: Family Medicine

## 2016-10-06 ENCOUNTER — Ambulatory Visit (INDEPENDENT_AMBULATORY_CARE_PROVIDER_SITE_OTHER): Payer: Medicare Other | Admitting: Family Medicine

## 2016-10-06 VITALS — BP 149/79 | HR 59 | Temp 98.0°F | Resp 16 | Ht 68.5 in | Wt 225.0 lb

## 2016-10-06 DIAGNOSIS — G4733 Obstructive sleep apnea (adult) (pediatric): Secondary | ICD-10-CM | POA: Diagnosis not present

## 2016-10-06 DIAGNOSIS — Z23 Encounter for immunization: Secondary | ICD-10-CM

## 2016-10-06 DIAGNOSIS — I10 Essential (primary) hypertension: Secondary | ICD-10-CM | POA: Diagnosis not present

## 2016-10-06 MED ORDER — HYDROCHLOROTHIAZIDE 25 MG PO TABS: ORAL_TABLET | ORAL | 5 refills | Status: DC

## 2016-10-06 NOTE — Assessment & Plan Note (Addendum)
Uncontrolled HTN. Declined repeat manual today. - Home BP readings none available, will start checking BP  No known complications. Does have OSA, waiting CPAP titration, likely secondary HTN component Prior BP med years ago, unsure which - checking old EHR **UPDATE - old med was Triamterene-HCTZ 37.5-25mg  taking HALF tab daily back in 2015   Plan:  1. Discussed options for managing HTN - agree to start HCTZ daily - start HALF tab for 12.5mg  daily, if not improved by 1-2 weeks then increase to whole tab 25mg  daily - Also CPAP should help control OSA which can be raising BP 2. Encourage improved lifestyle - low sodium diet, regular exercise 3. Start monitor BP outside office, bring readings to next visit, if persistently >140/90 or new symptoms notify office sooner 4. Follow-up 3 months HTN - sooner if not improved

## 2016-10-06 NOTE — Patient Instructions (Addendum)
Thank you for coming to the clinic today.  1. Go ahead and start new BP medication, Hydrochlorothiazide - take HALF tablet for dose 12.5mg  daily for now, every day in AM, check BP outside office, if readings >135/85 consistently then may increase to WHOLE pill 25mg  daily  1st dose Pneumonia vaccine today - Prevnar-13, then next due 1 year from now, Pneumovax-23 then done forever  Flu shot today  Please schedule a Follow-up Appointment to: Return in about 3 months (around 12/25/2016) for HTN.  If you have any other questions or concerns, please feel free to call the clinic or send a message through [place]. You may also schedule an earlier appointment if necessary.  Additionally, you may be receiving a survey about your experience at our clinic within a few days to 1 week by e-mail or mail. We value your feedback.  [name], DO [place]

## 2016-10-06 NOTE — Progress Notes (Addendum)
Subjective:    Patient ID: Curtis Gregory, male    DOB: 1950-12-30, 66 y.o.   MRN: [id number]  Curtis Gregory is a 66 y.o. male presenting on 10/06/2016 for Hypertension   HPI  CHRONIC HTN: - Last visit with me 05/22/16, for annual physical / covered same problem HTN, treated with discussion on management of HTN and lifestyle changes, pursue further sleep study as planned and monitor outside BP, see prior notes for background information. - Interval update with patient confirmed OSA on last sleep study, now awaiting for CPAP titration soon. He has several outside BP readings with elevated readings >140-150 / 90s - Today patient reports that he is here to follow-up and may be due to restart BP medication - He now has BP cuff at home, but not checking regularly, will start soon Current Meds - none currently - Prior history on other anti-HTN medication, unsure which, not available in current EHR chart, he checked with wife, but does not recall exactly - Also he is taking free testosterone OTC supplement, will discuss with Urology Lifestyle - weight stable over past 2 months, overall has lost some weight, doing well - Diet: Trying to reduce portion size, limited fried foods - Exercise: Continues to work out at gym, treadmill cardio >30 min up to 2x weekly, walking with wife Denies CP, dyspnea, HA, edema, dizziness / lightheadedness  OSA - Recently completed initial PSG, confirm dx OSA, now awaiting schedule CPAP titration.   Health Maintenance: - Due Flu Shot, will receive today - Due pneumonia vaccine, prevnar-13 due today, will get as well  Depression screen [facility] 2/9 10/06/2016 10/18/2014  Decreased Interest 0 0  Down, Depressed, Hopeless 0 0  PHQ - 2 Score 0 0    Social History  Substance Use Topics  . Smoking status: Never Smoker  . Smokeless tobacco: Never Used  . Alcohol use 0.6 oz/week    1 Glasses of wine per week     Comment: occasional    Review of Systems Per HPI unless  specifically indicated above     Objective:    BP (!) 149/79   Pulse (!) 59   Temp 98 F (36.7 C) (Oral)   Resp 16   Ht 5' 8.5" (1.74 m)   Wt 225 lb (102.1 kg)   BMI 33.71 kg/m   Wt Readings from Last 3 Encounters:  10/06/16 225 lb (102.1 kg)  09/11/16 226 lb (102.5 kg)  08/14/16 225 lb 12.8 oz (102.4 kg)    Physical Exam  Constitutional: He is oriented to person, place, and time. He appears well-developed and well-nourished. No distress.  Well-appearing, comfortable, cooperative  HENT:  Head: Normocephalic and atraumatic.  Mouth/Throat: Oropharynx is clear and moist.  Eyes: Conjunctivae are normal. Right eye exhibits no discharge. Left eye exhibits no discharge.  Neck: Normal range of motion. Neck supple.  Cardiovascular: Normal rate, regular rhythm, normal heart sounds and intact distal pulses.   No murmur heard. Pulmonary/Chest: Effort normal and breath sounds normal. No respiratory distress. He has no wheezes. He has no rales.  Musculoskeletal: He exhibits no edema.  Neurological: He is alert and oriented to person, place, and time.  Skin: Skin is warm and dry. No rash noted. He is not diaphoretic. No erythema.  Psychiatric: He has a normal mood and affect. His behavior is normal.  Well groomed, good eye contact, normal speech and thoughts  Nursing note and vitals reviewed.    Results for orders placed or performed in  visit on 05/22/16  COMPLETE METABOLIC PANEL WITH GFR  Result Value Ref Range   Sodium 141 135 - 146 mmol/L   Potassium 4.4 3.5 - 5.3 mmol/L   Chloride 107 98 - 110 mmol/L   CO2 25 20 - 31 mmol/L   Glucose, Bld 96 65 - 99 mg/dL   BUN 17 7 - 25 mg/dL   Creat 1.15 0.70 - 1.25 mg/dL   Total Bilirubin 0.7 0.2 - 1.2 mg/dL   Alkaline Phosphatase 53 40 - 115 U/L   AST 21 10 - 35 U/L   ALT 13 9 - 46 U/L   Total Protein 6.2 6.1 - 8.1 g/dL   Albumin 3.6 3.6 - 5.1 g/dL   Calcium 8.3 (L) 8.6 - 10.3 mg/dL   GFR, Est African American 77 >=60 mL/min   GFR,  Est Non African American 67 >=60 mL/min  Hemoglobin A1c  Result Value Ref Range   Hgb A1c MFr Bld 5.6 <5.7 %   Mean Plasma Glucose 114 mg/dL  CBC with Differential/Platelet  Result Value Ref Range   WBC 4.1 3.8 - 10.8 K/uL   RBC 4.83 4.20 - 5.80 MIL/uL   Hemoglobin 14.7 13.2 - 17.1 g/dL   HCT 44.4 38.5 - 50.0 %   MCV 91.9 80.0 - 100.0 fL   MCH 30.4 27.0 - 33.0 pg   MCHC 33.1 32.0 - 36.0 g/dL   RDW 13.8 11.0 - 15.0 %   Platelets 225 140 - 400 K/uL   MPV 8.8 7.5 - 12.5 fL   Neutro Abs 1,476 (L) 1,500 - 7,800 cells/uL   Lymphs Abs 2,009 850 - 3,900 cells/uL   Monocytes Absolute 369 200 - 950 cells/uL   Eosinophils Absolute 205 15 - 500 cells/uL   Basophils Absolute 41 0 - 200 cells/uL   Neutrophils Relative % 36 %   Lymphocytes Relative 49 %   Monocytes Relative 9 %   Eosinophils Relative 5 %   Basophils Relative 1 %   Smear Review Criteria for review not met   Lipid panel  Result Value Ref Range   Cholesterol 152 <200 mg/dL   Triglycerides 52 <150 mg/dL   HDL 47 >40 mg/dL   Total CHOL/HDL Ratio 3.2 <5.0 Ratio   VLDL 10 <30 mg/dL   LDL Cholesterol 95 <100 mg/dL  PSA, Total with Reflex to PSA, Free  Result Value Ref Range   PSA, Total 0.4 <=4.0 ng/mL  Hepatitis C antibody  Result Value Ref Range   HCV Ab NEGATIVE NEGATIVE  HIV antibody  Result Value Ref Range   HIV 1&2 Ab, 4th Generation NONREACTIVE NONREACTIVE      Assessment & Plan:   Problem List Items Addressed This Visit    Apnea, sleep    Confirmed on PSG, now awaiting CPAP titration Likely underlying secondary HTN due to controlled OSA Proceed with CPAP      Essential hypertension - Primary    Uncontrolled HTN. Declined repeat manual today. - Home BP readings none available, will start checking BP  No known complications. Does have OSA, waiting CPAP titration, likely secondary HTN component Prior BP med years ago, unsure which - checking old EHR **UPDATE - old med was Triamterene-HCTZ 37.5-25mg taking  HALF tab daily back in 2015   Plan:  1. Discussed options for managing HTN - agree to start HCTZ daily - start HALF tab for 12.78m daily, if not improved by 1-2 weeks then increase to whole tab 268mdaily - Also CPAP should  help control OSA which can be raising BP 2. Encourage improved lifestyle - low sodium diet, regular exercise 3. Start monitor BP outside office, bring readings to next visit, if persistently >140/90 or new symptoms notify office sooner 4. Follow-up 3 months HTN - sooner if not improved      Relevant Medications   hydrochlorothiazide (HYDRODIURIL) 25 MG tablet    Other Visit Diagnoses    Need for vaccination with 13-polyvalent pneumococcal conjugate vaccine       Relevant Orders   Pneumococcal conjugate vaccine 13-valent IM (Completed)   Needs flu shot       Relevant Orders   Flu vaccine HIGH DOSE PF (Completed)      Meds ordered this encounter  Medications  . hydrochlorothiazide (HYDRODIURIL) 25 MG tablet    Sig: Start taking half tablet (dose 12.39m) daily, if not controlled can increase to 1 whole tablet daily (232m    Dispense:  30 tablet    Refill:  5    Follow up plan: Return in about 3 months (around 12/25/2016) for HTN.  [facility] 10/06/2016, 5:11 PM

## 2016-10-06 NOTE — Assessment & Plan Note (Signed)
Confirmed on PSG, now awaiting CPAP titration Likely underlying secondary HTN due to controlled OSA Proceed with CPAP

## 2016-10-08 ENCOUNTER — Ambulatory Visit: Payer: Medicare Other | Attending: Otolaryngology

## 2016-10-08 DIAGNOSIS — G4733 Obstructive sleep apnea (adult) (pediatric): Secondary | ICD-10-CM | POA: Diagnosis not present

## 2016-10-08 DIAGNOSIS — G4761 Periodic limb movement disorder: Secondary | ICD-10-CM | POA: Insufficient documentation

## 2016-11-07 ENCOUNTER — Other Ambulatory Visit: Payer: Self-pay

## 2016-11-26 DIAGNOSIS — G4733 Obstructive sleep apnea (adult) (pediatric): Secondary | ICD-10-CM | POA: Diagnosis not present

## 2016-11-26 DIAGNOSIS — E663 Overweight: Secondary | ICD-10-CM | POA: Diagnosis not present

## 2016-12-25 ENCOUNTER — Ambulatory Visit: Payer: Medicare Other | Admitting: Family Medicine

## 2017-01-14 ENCOUNTER — Ambulatory Visit (INDEPENDENT_AMBULATORY_CARE_PROVIDER_SITE_OTHER): Payer: Medicare Other | Admitting: Family Medicine

## 2017-01-14 ENCOUNTER — Encounter: Payer: Self-pay | Admitting: Family Medicine

## 2017-01-14 VITALS — BP 121/71 | HR 87 | Temp 98.4°F | Resp 16 | Ht 68.5 in | Wt 231.0 lb

## 2017-01-14 DIAGNOSIS — B9789 Other viral agents as the cause of diseases classified elsewhere: Secondary | ICD-10-CM

## 2017-01-14 DIAGNOSIS — J302 Other seasonal allergic rhinitis: Secondary | ICD-10-CM | POA: Diagnosis not present

## 2017-01-14 DIAGNOSIS — J3089 Other allergic rhinitis: Secondary | ICD-10-CM | POA: Diagnosis not present

## 2017-01-14 DIAGNOSIS — J069 Acute upper respiratory infection, unspecified: Secondary | ICD-10-CM

## 2017-01-14 MED ORDER — PSEUDOEPH-BROMPHEN-DM 30-2-10 MG/5ML PO SYRP: 5.0000 mL | ORAL_SOLUTION | Freq: Four times a day (QID) | ORAL | 0 refills | Status: DC | PRN

## 2017-01-14 MED ORDER — FLUTICASONE PROPIONATE 50 MCG/ACT NA SUSP: 2.0000 | Freq: Every day | NASAL | 3 refills | Status: DC

## 2017-01-14 NOTE — Patient Instructions (Addendum)
Thank you for coming to the office today.  1.  Most likely viral syndrome with congestion drainage and triggering cough, may get worse with bronchitis or sinusitis if not improving after 7-10 days  Start Atrovent nasal spray decongestant 2 sprays in each nostril up to 4 times daily for 7 days  Start cough syrup  May try plain Mucienx (without additives) for up to 1 week to clear congestion  After 1 week then start nasal steroid Flonase 2 sprays in each nostril daily for 4-6 weeks, may repeat course seasonally or as needed  Increase hydration to thin secretions  If not improving worsening cough sinus pain or pressure, fever chills, then notify office early next week we can try antibiotic or return for re-evaluation  Please schedule a Follow-up Appointment to: Return in about 1 week (around 01/21/2017), or if symptoms worsen or fail to improve, for URI.  If you have any other questions or concerns, please feel free to call the office or send a message through [place]. You may also schedule an earlier appointment if necessary.  Additionally, you may be receiving a survey about your experience at our office within a few days to 1 week by e-mail or mail. We value your feedback.  [name], DO [place]

## 2017-01-14 NOTE — Progress Notes (Signed)
Subjective:    Patient ID: Curtis Gregory, male    DOB: 05-16-1950, 67 y.o.   MRN: [id number]  Curtis Gregory is a 67 y.o. male presenting on 01/14/2017 for Cough (cold, cough onset 2 days nasal congestion but no chills or fever)  Patient presents for a same day appointment.  HPI   URI / Rhinitis / Cough Reports symptoms started with cough and congestion for past 2 days, seems worsening now with some productive cough, persistent sinus congestion, similar to prior episodes. No recent antibiotics. In past 09/2016 had sinus allergic rhinitis, in past on flonase, no longer taking. 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 Smoking status: Never Smoker  . Smokeless tobacco: Never Used  Substance Use Topics  . Alcohol use: Yes    Alcohol/week: 0.6 oz    Types: 1 Glasses of wine per week    Comment: occasional  . Drug use: No    Review of Systems Per HPI unless specifically indicated above     Objective:    BP 121/71   Pulse 87   Temp 98.4 F (36.9 C) (Oral)   Resp 16   Ht 5' 8.5" (1.74 m)   Wt 231 lb (104.8 kg)   SpO2 99%   BMI 34.61 kg/m   Wt Readings from Last 3 Encounters:  01/14/17 231 lb (104.8 kg)  10/06/16 225 lb (102.1 kg)  09/11/16 226 lb (102.5 kg)    Physical Exam  Constitutional: He is oriented to person, place, and time. He appears  well-developed and well-nourished. No distress.  Well-appearing, comfortable, cooperative  HENT:  Head: Normocephalic and atraumatic.  Mouth/Throat: Oropharynx is clear and moist.  Frontal / maxillary sinuses non-tender. Nares turbinate edema with congestion without purulence. Bilateral TMs clear with L>R mild effusion without erythema or bulging. Oropharynx mild posterior pharyngeal drainage without erythema, exudates, edema or asymmetry.  Eyes: Conjunctivae are normal. Right eye exhibits no discharge. Left eye exhibits no discharge.  Neck: Normal range of motion. Neck supple.  Cardiovascular: Normal rate, regular rhythm, normal heart sounds and intact distal pulses.  No murmur heard. Pulmonary/Chest: Effort normal and breath sounds normal. No respiratory distress. He has no wheezes. He has no rales.  Good air movement except mild reduced at bilateral bases, likely respiratory effort. No focal crackles or wheezing.  Musculoskeletal: He exhibits no edema.  Lymphadenopathy:    He has no cervical adenopathy.  Neurological: He is alert and oriented to person, place, and time.  Skin: Skin is warm and dry. No rash noted. He is not diaphoretic. No erythema.  Psychiatric: His behavior is normal.  Nursing note and vitals reviewed.  Results for orders placed or performed in visit on 05/22/16  COMPLETE METABOLIC PANEL WITH GFR  Result Value Ref Range   Sodium  141 135 - 146 mmol/L   Potassium 4.4 3.5 - 5.3 mmol/L   Chloride 107 98 - 110 mmol/L   CO2 25 20 - 31 mmol/L   Glucose, Bld 96 65 - 99 mg/dL   BUN 17 7 - 25 mg/dL   Creat 1.15 0.70 - 1.25 mg/dL   Total Bilirubin 0.7 0.2 - 1.2 mg/dL   Alkaline Phosphatase 53 40 - 115 U/L   AST 21 10 - 35 U/L   ALT 13 9 - 46 U/L   Total Protein 6.2 6.1 - 8.1 g/dL   Albumin 3.6 3.6 - 5.1 g/dL   Calcium 8.3 (L) 8.6 - 10.3 mg/dL   GFR, Est African American 77 >=60 mL/min   GFR, Est Non African American 67 >=60 mL/min  Hemoglobin A1c  Result Value Ref  Range   Hgb A1c MFr Bld 5.6 <5.7 %   Mean Plasma Glucose 114 mg/dL  CBC with Differential/Platelet  Result Value Ref Range   WBC 4.1 3.8 - 10.8 K/uL   RBC 4.83 4.20 - 5.80 MIL/uL   Hemoglobin 14.7 13.2 - 17.1 g/dL   HCT 44.4 38.5 - 50.0 %   MCV 91.9 80.0 - 100.0 fL   MCH 30.4 27.0 - 33.0 pg   MCHC 33.1 32.0 - 36.0 g/dL   RDW 13.8 11.0 - 15.0 %   Platelets 225 140 - 400 K/uL   MPV 8.8 7.5 - 12.5 fL   Neutro Abs 1,476 (L) 1,500 - 7,800 cells/uL   Lymphs Abs 2,009 850 - 3,900 cells/uL   Monocytes Absolute 369 200 - 950 cells/uL   Eosinophils Absolute 205 15 - 500 cells/uL   Basophils Absolute 41 0 - 200 cells/uL   Neutrophils Relative % 36 %   Lymphocytes Relative 49 %   Monocytes Relative 9 %   Eosinophils Relative 5 %   Basophils Relative 1 %   Smear Review Criteria for review not met   Lipid panel  Result Value Ref Range   Cholesterol 152 <200 mg/dL   Triglycerides 52 <150 mg/dL   HDL 47 >40 mg/dL   Total CHOL/HDL Ratio 3.2 <5.0 Ratio   VLDL 10 <30 mg/dL   LDL Cholesterol 95 <100 mg/dL  PSA, Total with Reflex to PSA, Free  Result Value Ref Range   PSA, Total 0.4 <=4.0 ng/mL  Hepatitis C antibody  Result Value Ref Range   HCV Ab NEGATIVE NEGATIVE  HIV antibody  Result Value Ref Range   HIV 1&2 Ab, 4th Generation NONREACTIVE NONREACTIVE      Assessment & Plan:   Problem List Items Addressed This Visit    Seasonal allergies    Other Visit Diagnoses    Viral URI with cough    -  Primary   Relevant Medications   brompheniramine-pseudoephedrine-DM 30-2-10 MG/5ML syrup   Seasonal allergic rhinitis due to other allergic trigger       Relevant Medications   fluticasone (FLONASE) 50 MCG/ACT nasal spray      Consistent with viral URI rhinitis symptoms x 2-3 days, with potential sick contacts by history Afebrile, well hydrated on exam, no focal signs of infection (ears, throat, lungs clear).  Plan: 1. Reassurance, likely self-limited with cough lasting up to few  weeks - (has existing rx at home) Start Atrovent nasal spray decongestant 2 sprays each nostril up to 4 times daily for 5-7 days - Rx cough syrup sent - trial on Mucinex OTC, coupon given - After 1 week, new rx sent for  Flonase 2 sprays each nostril daily for up to 4-6 weeks -  Supportive care with nasal saline, warm herbal tea with honey, 2. Improve hydration 3. Tylenol / Motrin PRN fevers 4. Return criteria given   Meds ordered this encounter  Medications  . fluticasone (FLONASE) 50 MCG/ACT nasal spray    Sig: Place 2 sprays into both nostrils daily. Use for 4-6 weeks then stop and use seasonally or as needed.    Dispense:  16 g    Refill:  3  . brompheniramine-pseudoephedrine-DM 30-2-10 MG/5ML syrup    Sig: Take 5 mLs by mouth 4 (four) times daily as needed.    Dispense:  118 mL    Refill:  0    Follow up plan: Return in about 1 week (around 01/21/2017), or if symptoms worsen or fail to improve, for URI.  [name], DO [facility] 01/14/2017, 6:27 PM

## 2017-02-11 ENCOUNTER — Encounter: Payer: Self-pay | Admitting: Family Medicine

## 2017-02-11 ENCOUNTER — Ambulatory Visit (INDEPENDENT_AMBULATORY_CARE_PROVIDER_SITE_OTHER): Payer: Medicare Other | Admitting: Family Medicine

## 2017-02-11 VITALS — BP 142/75 | HR 77 | Temp 98.5°F | Resp 16 | Ht 68.5 in | Wt 229.0 lb

## 2017-02-11 DIAGNOSIS — J0110 Acute frontal sinusitis, unspecified: Secondary | ICD-10-CM

## 2017-02-11 MED ORDER — IPRATROPIUM BROMIDE 0.06 % NA SOLN: NASAL | 0 refills | Status: DC

## 2017-02-11 MED ORDER — AMOXICILLIN-POT CLAVULANATE 875-125 MG PO TABS: 1.0000 | ORAL_TABLET | Freq: Two times a day (BID) | ORAL | 0 refills | Status: DC

## 2017-02-11 NOTE — Patient Instructions (Addendum)
Thank you for coming to the office today.  1. It sounds like you have a Sinusitis (Bacterial Infection) - this most likely started as an Upper Respiratory Virus that has settled into an infection. Allergies can also cause this. - Start Augmentin 1 pill twice daily (breakfast and dinner, with food and plenty of water) for 10 days, complete entire course, do not stop early even if feeling better Start Atrovent nasal spray decongestant 2 sprays in each nostril up to 4 times daily for 7 days  May finish mucinex for few days  - Recommend to using Nasal Saline spray multiple times a day to help flush out congestion and clear sinuses - Improve hydration by drinking plenty of clear fluids (water, gatorade) to reduce secretions and thin congestion - Congestion draining down throat can cause irritation. May try warm herbal tea with honey, cough drops - Can take Tylenol or Ibuprofen as needed for fevers  If you develop persistent fever >101F for at least 3 consecutive days, headaches with sinus pain or pressure or persistent earache, please schedule a follow-up evaluation within next few days to week.  Please schedule a Follow-up Appointment to: Return in about 2 weeks (around 02/25/2017), or if symptoms worsen or fail to improve, for Sinusitis.  If you have any other questions or concerns, please feel free to call the office or send a message through [place]. You may also schedule an earlier appointment if necessary.  Additionally, you may be receiving a survey about your experience at our office within a few days to 1 week by e-mail or mail. We value your feedback.  [name], DO [place]

## 2017-02-11 NOTE — Progress Notes (Signed)
Subjective:    Patient ID: Curtis Gregory, male    DOB: [birth year], 67 y.o.   MRN: [id number]  Curtis Gregory is a 67 y.o. male presenting on 02/11/2017 for Sinusitis (onset 4 days runny nose head congestion nasal congestion hard to use CPAP machine with sinusitis OTC taken)  Patient presents for a same day appointment.  HPI   SINUSITIS Reports that his symptoms started about 4-5 days ago with sinus congestion and pressure, and then worsening drainage and runny nose and now some worsening cough. Sick contact wife, URI Previously had URI 01/14/17 treated with flonase and cough syrup, this resolved and was self limited He has nasal CPAP, and had difficulty with cleaning his machine while traveling, only could use disinfectant Denies any fevers/chills, sweats, body aches, nausea vomiting, diarrhea  Health Maintenance: UTD Flu vaccine 09/2016 UTD PNA vaccine currently - next dose Pneumovaax due 10/06/17  Depression screen PHQ 2/9 10/06/2016 10/18/2014  Decreased Interest 0 0  Down, Depressed, Hopeless 0 0  PHQ - 2 Score 0 0    Social History   Tobacco Use  . Smoking status: Never Smoker  . Smokeless tobacco: Never Used  Substance Use Topics  . Alcohol use: Yes    Alcohol/week: 0.6 oz    Types: 1 Glasses of wine per week    Comment: occasional  . Drug use: No    Review of Systems Per HPI unless specifically indicated above     Objective:    BP (!) 142/75   Pulse 77   Temp 98.5 F (36.9 C) (Oral)   Resp 16   Ht 5' 8.5" (1.74 m)   Wt 229 lb (103.9 kg)   SpO2 98%   BMI 34.31 kg/m   Wt Readings from Last 3 Encounters:  02/11/17 229 lb (103.9 kg)  01/14/17 231 lb (104.8 kg)  10/06/16 225 lb (102.1 kg)    Physical Exam  Constitutional: He is oriented to person, place, and time. He appears well-developed and well-nourished. No distress.  Mildly ill appearing, comfortable, cooperative  HENT:  Head: Normocephalic and atraumatic.  Mouth/Throat: Oropharynx is clear and  moist.  Frontal / maxillary sinuses mild tender. Nares with turbinate edema with congestion without purulence. Bilateral TMs clear with some mild effusion with fullness without erythema or bulging. Oropharynx with mild posterior pharyngeal drainage without erythema, exudates, edema or asymmetry.  Eyes: Conjunctivae are normal. Right eye exhibits no discharge. Left eye exhibits no discharge.  Neck: Normal range of motion. Neck supple.  Cardiovascular: Normal rate, regular rhythm, normal heart sounds and intact distal pulses.  No murmur heard. Pulmonary/Chest: Effort normal and breath sounds normal. No respiratory distress. He has no wheezes. He has no rales.  Mild transmitted upper airway breath sounds, mostly clear after cough. No focal wheezing or rhonchi or crackles  Musculoskeletal: Normal range of motion. He exhibits no edema.  Lymphadenopathy:    He has no cervical adenopathy.  Neurological: He is alert and oriented to person, place, and time.  Skin: Skin is warm and dry. No rash noted. He is not diaphoretic. No erythema.  Psychiatric: He has a normal mood and affect. His behavior is normal.  Well groomed, good eye contact, normal speech and thoughts  Nursing note and vitals reviewed.  Results for orders placed or performed in visit on 05/22/16  COMPLETE METABOLIC PANEL WITH GFR  Result Value Ref Range   Sodium 141 135 - 146 mmol/L   Potassium 4.4 3.5 - 5.3 mmol/L   Chloride  107 98 - 110 mmol/L   CO2 25 20 - 31 mmol/L   Glucose, Bld 96 65 - 99 mg/dL   BUN 17 7 - 25 mg/dL   Creat 1.15 0.70 - 1.25 mg/dL   Total Bilirubin 0.7 0.2 - 1.2 mg/dL   Alkaline Phosphatase 53 40 - 115 U/L   AST 21 10 - 35 U/L   ALT 13 9 - 46 U/L   Total Protein 6.2 6.1 - 8.1 g/dL   Albumin 3.6 3.6 - 5.1 g/dL   Calcium 8.3 (L) 8.6 - 10.3 mg/dL   GFR, Est African American 77 >=60 mL/min   GFR, Est Non African American 67 >=60 mL/min  Hemoglobin A1c  Result Value Ref Range   Hgb A1c MFr Bld 5.6 <5.7 %    Mean Plasma Glucose 114 mg/dL  CBC with Differential/Platelet  Result Value Ref Range   WBC 4.1 3.8 - 10.8 K/uL   RBC 4.83 4.20 - 5.80 MIL/uL   Hemoglobin 14.7 13.2 - 17.1 g/dL   HCT 44.4 38.5 - 50.0 %   MCV 91.9 80.0 - 100.0 fL   MCH 30.4 27.0 - 33.0 pg   MCHC 33.1 32.0 - 36.0 g/dL   RDW 13.8 11.0 - 15.0 %   Platelets 225 140 - 400 K/uL   MPV 8.8 7.5 - 12.5 fL   Neutro Abs 1,476 (L) 1,500 - 7,800 cells/uL   Lymphs Abs 2,009 850 - 3,900 cells/uL   Monocytes Absolute 369 200 - 950 cells/uL   Eosinophils Absolute 205 15 - 500 cells/uL   Basophils Absolute 41 0 - 200 cells/uL   Neutrophils Relative % 36 %   Lymphocytes Relative 49 %   Monocytes Relative 9 %   Eosinophils Relative 5 %   Basophils Relative 1 %   Smear Review Criteria for review not met   Lipid panel  Result Value Ref Range   Cholesterol 152 <200 mg/dL   Triglycerides 52 <150 mg/dL   HDL 47 >40 mg/dL   Total CHOL/HDL Ratio 3.2 <5.0 Ratio   VLDL 10 <30 mg/dL   LDL Cholesterol 95 <100 mg/dL  PSA, Total with Reflex to PSA, Free  Result Value Ref Range   PSA, Total 0.4 <=4.0 ng/mL  Hepatitis C antibody  Result Value Ref Range   HCV Ab NEGATIVE NEGATIVE  HIV antibody  Result Value Ref Range   HIV 1&2 Ab, 4th Generation NONREACTIVE NONREACTIVE      Assessment & Plan:   Problem List Items Addressed This Visit    None    Visit Diagnoses    Acute non-recurrent frontal sinusitis    -  Primary  Consistent with acute frontal rhinosinusitis, likely initially viral URI vs allergic rhinitis component with worsening concern for bacterial infection.   Plan: 1. Start Augmentin 875-125m PO BID x 10 days 2. Hold nasal steroid. Start Atrovent nasal spray decongestant 2 sprays in each nostril up to 4 times daily for 7 days 3. Supportive care with nasal saline OTC, hydration, warm compresses and sinus effleurage as demonstrated 4. Return criteria reviewed     Relevant Medications   ipratropium (ATROVENT) 0.06 % nasal  spray   amoxicillin-clavulanate (AUGMENTIN) 875-125 MG tablet      Meds ordered this encounter  Medications  . ipratropium (ATROVENT) 0.06 % nasal spray    Sig: USE 2 SPRAYS IN EACH NOSTRIL FOUR TIMES DAILY FOR UP TO 5 TO 7 DAYS THEN STOP    Dispense:  15 mL  Refill:  0  . amoxicillin-clavulanate (AUGMENTIN) 875-125 MG tablet    Sig: Take 1 tablet by mouth 2 (two) times daily. For 10 days    Dispense:  20 tablet    Refill:  0      Follow up plan: Return in about 2 weeks (around 02/25/2017), or if symptoms worsen or fail to improve, for Sinusitis.  [name], [facility] 02/11/2017, 2:39 PM

## 2017-03-11 DIAGNOSIS — G4733 Obstructive sleep apnea (adult) (pediatric): Secondary | ICD-10-CM | POA: Diagnosis not present

## 2017-03-12 ENCOUNTER — Other Ambulatory Visit: Payer: Self-pay | Admitting: Family Medicine

## 2017-03-12 DIAGNOSIS — J0110 Acute frontal sinusitis, unspecified: Secondary | ICD-10-CM

## 2017-04-17 ENCOUNTER — Other Ambulatory Visit: Payer: Self-pay

## 2017-04-17 DIAGNOSIS — K219 Gastro-esophageal reflux disease without esophagitis: Secondary | ICD-10-CM

## 2017-04-17 MED ORDER — OMEPRAZOLE 20 MG PO CPDR: 20.0000 mg | DELAYED_RELEASE_CAPSULE | Freq: Every day | ORAL | 0 refills | Status: DC

## 2017-06-03 ENCOUNTER — Ambulatory Visit (INDEPENDENT_AMBULATORY_CARE_PROVIDER_SITE_OTHER): Payer: Medicare Other | Admitting: Family Medicine

## 2017-06-03 ENCOUNTER — Encounter: Payer: Self-pay | Admitting: Family Medicine

## 2017-06-03 VITALS — BP 124/69 | HR 83 | Temp 98.6°F | Resp 16 | Ht 68.5 in | Wt 226.4 lb

## 2017-06-03 DIAGNOSIS — K219 Gastro-esophageal reflux disease without esophagitis: Secondary | ICD-10-CM | POA: Diagnosis not present

## 2017-06-03 DIAGNOSIS — J302 Other seasonal allergic rhinitis: Secondary | ICD-10-CM

## 2017-06-03 DIAGNOSIS — J3089 Other allergic rhinitis: Secondary | ICD-10-CM | POA: Diagnosis not present

## 2017-06-03 DIAGNOSIS — J019 Acute sinusitis, unspecified: Secondary | ICD-10-CM | POA: Diagnosis not present

## 2017-06-03 MED ORDER — FLUTICASONE PROPIONATE 50 MCG/ACT NA SUSP: 2.0000 | Freq: Every day | NASAL | 3 refills | Status: DC

## 2017-06-03 NOTE — Progress Notes (Signed)
Subjective:    Patient ID: Curtis Gregory, male    DOB: [birth year], 67 y.o.   MRN: [id number]  Curtis Gregory is a 67 y.o. male presenting on 06/03/2017 for Nasal Congestion (sore throat, cough clear mucus onset 3 days OTC medicine taken onset 3 days)  Patient presents for a same day appointment.  HPI   ACUTE RHINOSINUSITIS / Allergic rhinitis Reports symptoms started about 3 days ago with congestion, sinus drainage and then worse sore throat now - Additionally he has history of GERD, and he was taking Omeprazole '20mg'$  daily, then stopped in April 2019 - No known sick contacts, except wife has allergies some pollen - He admits history of allergies, he was off Allegra and Flonase for while, not during Spring 2019, and then he just restarted both yesterday - Additionally he is on CPAP machine for OSA since 12/2016 - Denies any fevers, chills, cough, productive cough, headache, rash  GERD, Chronic History of chronic GERD with reflux symptoms has been on Omeprazole '20mg'$  daily for at least 4 years, doing well, recently had run out of med, and had problem w/ rx at pharmacy, unable to pick up from 04/2017, he was out for 1 month, did not have rebound symptoms immediately. He started OTC version, he says '10mg'$  and not the '20mg'$ . Now we called pharmacy today and confirmed he would be able to pick up rx omeprazole '20mg'$  daily, advised to resume med. - Denies ever having GI follow or having EGD - Denies nausea vomiting abdominal pain early satiety, dysphagia   Depression screen [facility] 2/9 06/03/2017 10/06/2016 10/18/2014  Decreased Interest 0 0 0  Down, Depressed, Hopeless 0 0 0  PHQ - 2 Score 0 0 0    Social History   Tobacco Use  . Smoking status: Never Smoker  . Smokeless tobacco: Never Used  Substance Use Topics  . Alcohol use: Yes    Alcohol/week: 0.6 oz    Types: 1 Glasses of wine per week    Comment: occasional  . Drug use: No    Review of Systems Per HPI unless specifically indicated  above     Objective:    BP 124/69   Pulse 83   Temp 98.6 F (37 C) (Oral)   Resp 16   Ht 5' 8.5" (1.74 m)   Wt 226 lb 6.4 oz (102.7 kg)   SpO2 99%   BMI 33.92 kg/m   Wt Readings from Last 3 Encounters:  06/03/17 226 lb 6.4 oz (102.7 kg)  02/11/17 229 lb (103.9 kg)  01/14/17 231 lb (104.8 kg)    Physical Exam  Constitutional: He is oriented to person, place, and time. He appears well-developed and well-nourished. No distress.  Mild tired appearing, comfortable, cooperative  HENT:  Head: Normocephalic and atraumatic.  Mouth/Throat: Oropharynx is clear and moist.  Frontal / maxillary sinuses non-tender. Nares with mild turbinate edema with congestion without purulence. Bilateral TMs with clear effusion and fullness without erythema or bulging. Oropharynx with mild generalized posterior erythema without exudates, edema or asymmetry.  Eyes: Conjunctivae are normal. Right eye exhibits no discharge. Left eye exhibits no discharge.  Neck: Normal range of motion. Neck supple.  Cardiovascular: Normal rate, regular rhythm, normal heart sounds and intact distal pulses.  No murmur heard. Pulmonary/Chest: Effort normal and breath sounds normal. No respiratory distress. He has no wheezes. He has no rales.  Lymphadenopathy:    He has no cervical adenopathy.  Neurological: He is alert and oriented to person, place, and  time.  Skin: Skin is warm and dry. No rash noted. He is not diaphoretic. No erythema.  Psychiatric: He has a normal mood and affect. His behavior is normal.  Well groomed, good eye contact, normal speech and thoughts  Nursing note and vitals reviewed.  Results for orders placed or performed in visit on 05/22/16  COMPLETE METABOLIC PANEL WITH GFR  Result Value Ref Range   Sodium 141 135 - 146 mmol/L   Potassium 4.4 3.5 - 5.3 mmol/L   Chloride 107 98 - 110 mmol/L   CO2 25 20 - 31 mmol/L   Glucose, Bld 96 65 - 99 mg/dL   BUN 17 7 - 25 mg/dL   Creat 1.15 0.70 - 1.25 mg/dL     Total Bilirubin 0.7 0.2 - 1.2 mg/dL   Alkaline Phosphatase 53 40 - 115 U/L   AST 21 10 - 35 U/L   ALT 13 9 - 46 U/L   Total Protein 6.2 6.1 - 8.1 g/dL   Albumin 3.6 3.6 - 5.1 g/dL   Calcium 8.3 (L) 8.6 - 10.3 mg/dL   GFR, Est African American 77 >=60 mL/min   GFR, Est Non African American 67 >=60 mL/min  Hemoglobin A1c  Result Value Ref Range   Hgb A1c MFr Bld 5.6 <5.7 %   Mean Plasma Glucose 114 mg/dL  CBC with Differential/Platelet  Result Value Ref Range   WBC 4.1 3.8 - 10.8 K/uL   RBC 4.83 4.20 - 5.80 MIL/uL   Hemoglobin 14.7 13.2 - 17.1 g/dL   HCT 44.4 38.5 - 50.0 %   MCV 91.9 80.0 - 100.0 fL   MCH 30.4 27.0 - 33.0 pg   MCHC 33.1 32.0 - 36.0 g/dL   RDW 13.8 11.0 - 15.0 %   Platelets 225 140 - 400 K/uL   MPV 8.8 7.5 - 12.5 fL   Neutro Abs 1,476 (L) 1,500 - 7,800 cells/uL   Lymphs Abs 2,009 850 - 3,900 cells/uL   Monocytes Absolute 369 200 - 950 cells/uL   Eosinophils Absolute 205 15 - 500 cells/uL   Basophils Absolute 41 0 - 200 cells/uL   Neutrophils Relative % 36 %   Lymphocytes Relative 49 %   Monocytes Relative 9 %   Eosinophils Relative 5 %   Basophils Relative 1 %   Smear Review Criteria for review not met   Lipid panel  Result Value Ref Range   Cholesterol 152 <200 mg/dL   Triglycerides 52 <150 mg/dL   HDL 47 >40 mg/dL   Total CHOL/HDL Ratio 3.2 <5.0 Ratio   VLDL 10 <30 mg/dL   LDL Cholesterol 95 <100 mg/dL  PSA, Total with Reflex to PSA, Free  Result Value Ref Range   PSA, Total 0.4 <=4.0 ng/mL  Hepatitis C antibody  Result Value Ref Range   HCV Ab NEGATIVE NEGATIVE  HIV antibody  Result Value Ref Range   HIV 1&2 Ab, 4th Generation NONREACTIVE NONREACTIVE      Assessment & Plan:   Problem List Items Addressed This Visit    GERD (gastroesophageal reflux disease) Continue PPI Omeprazole '20mg'$  daily, confirmed w/ pharmacy he can pick up rx today, unsure of what barrier to fill was. May take OTC if rx not covered - No history to suggestive rebound  symptoms Follow-up in future if need, consider GI or adjust dose up to '40mg'$  if needed    Seasonal allergies - Primary    Other Visit Diagnoses    Seasonal allergic rhinitis due to  other allergic trigger       Relevant Medications   fluticasone (FLONASE) 50 MCG/ACT nasal spray      Consistent with acute rhinosinusitis, likely initially viral URI vs allergic rhinitis component without evidence for bacterial infection.  Plan: 1. Reassurance, likely self-limited - no indication for antibiotics at this time 2. Start existing at home - alrdy has from 02/2017 - start Atrovent nasal spray decongestant 2 sprays in each nostril up to 4 times daily for 7 days - HOLD Flonase for now, since just started - suspect he did not use allergy meds early enough only just started - after 1 week of atrovent then restart Flonase - printed flonase to use after  - RESTART Allegra daily - Improve hydration, OTC supportive care PRN Return criteria reviewed - if worsening sign of sinusitis or other infection   Meds ordered this encounter  Medications  . fluticasone (FLONASE) 50 MCG/ACT nasal spray    Sig: Place 2 sprays into both nostrils daily. Use for 4-6 weeks then stop and use seasonally or as needed.    Dispense:  16 g    Refill:  3    Follow up plan: Return in about 1 week (around 06/10/2017), or if symptoms worsen or fail to improve, for allergic rhinosinusitis.   [name], [facility] 06/04/2017, 12:34 AM

## 2017-06-03 NOTE — Patient Instructions (Addendum)
Thank you for coming to the office today.  1. It sounds like you have persistent Sinus Congestion or "Rhinosinusitis" - I do not think that this is a Bacterial Sinus Infection. Usually these are caused by Viruses or Allergies, and will run it's course in about 7 to 10 days. - No antibiotics are needed - Restart Allegra allergy pill 180mg  daily - HOLD Flonase - (med at home) Start Atrovent nasal spray decongestant 2 sprays in each nostril up to 4 times daily for 7 days - THEN after 1 week restart Flonase 2 sprays in each nostril daily for next 4-6 weeks, then you may stop and use seasonally or as needed  - Recommend to start using Nasal Saline spray multiple times a day to help flush out congestion and clear sinuses - Improve hydration by drinking plenty of clear fluids (water, gatorade) to reduce secretions and thin congestion - Congestion draining down throat can cause irritation. May try warm herbal tea with honey, cough drops - Can take Tylenol or Ibuprofen as needed for fevers  May try mucinex sinus for 1 week  We will check with pharmacy about Omeprazole  OTC should be 20mg , same medicine, it may not be covered  If you develop persistent fever >101F for at least 3 consecutive days, headaches with sinus pain or pressure or persistent earache, please schedule a follow-up evaluation within next few days to week.   Please schedule a Follow-up Appointment to: Return in about 1 week (around 06/10/2017), or if symptoms worsen or fail to improve, for allergic rhinosinusitis.  If you have any other questions or concerns, please feel free to call the office or send a message through [place]. You may also schedule an earlier appointment if necessary.  Additionally, you may be receiving a survey about your experience at our office within a few days to 1 week by e-mail or mail. We value your feedback.  [name], DO [place]

## 2017-06-04 ENCOUNTER — Telehealth: Payer: Self-pay | Admitting: Family Medicine

## 2017-06-04 ENCOUNTER — Encounter: Payer: Self-pay | Admitting: Family Medicine

## 2017-06-04 DIAGNOSIS — J019 Acute sinusitis, unspecified: Secondary | ICD-10-CM

## 2017-06-04 DIAGNOSIS — R059 Cough, unspecified: Secondary | ICD-10-CM

## 2017-06-04 DIAGNOSIS — R05 Cough: Secondary | ICD-10-CM

## 2017-06-04 MED ORDER — AMOXICILLIN 500 MG PO CAPS: 500.0000 mg | ORAL_CAPSULE | Freq: Two times a day (BID) | ORAL | 0 refills | Status: DC

## 2017-06-04 MED ORDER — PSEUDOEPH-BROMPHEN-DM 30-2-10 MG/5ML PO SYRP: 5.0000 mL | ORAL_SOLUTION | Freq: Four times a day (QID) | ORAL | 0 refills | Status: DC | PRN

## 2017-06-04 NOTE — Telephone Encounter (Signed)
Last seen 5/22, see note. He called back early stating significant worse, he thinks fever last night, yellow mucus congestion, and some headache. Persistent cough. Tessalon perls do not work for him he requests cough syrup and antibiotic from our conversation.  Sent Amoxicillin course up to 7 days only fill if worsening as advised prefer to wait up to 5-7 days from onset.  Cough syrup non codeine sent to pharmacy.  [name], [facility] 06/04/2017, 12:20 PM

## 2017-06-04 NOTE — Telephone Encounter (Signed)
Pt. Called states he was not better he was worse. Pt call back # [phone],

## 2017-06-05 ENCOUNTER — Telehealth: Payer: Self-pay

## 2017-06-05 NOTE — Telephone Encounter (Signed)
Called [facility] and approval is done for 36 month for Omeprazole 20 mg.

## 2017-06-24 ENCOUNTER — Telehealth: Payer: Self-pay | Admitting: Family Medicine

## 2017-06-24 DIAGNOSIS — K644 Residual hemorrhoidal skin tags: Secondary | ICD-10-CM

## 2017-06-24 MED ORDER — HYDROCORTISONE ACETATE 25 MG RE SUPP: 25.0000 mg | Freq: Two times a day (BID) | RECTAL | 0 refills | Status: DC

## 2017-06-24 NOTE — Telephone Encounter (Signed)
Called patient to review and sent rx Anusol-HC 25mg  suppository rectally BID for up to 7 days, he has used these before with good results, no hemorrhoids recently now with pain w/o bleeding. He understands if not improve he needs to follow-up at our office or at urgent care, and future may need gen surgery if acute worsening.  [name], [facility] 06/24/2017, 4:41 PM

## 2017-06-24 NOTE — Telephone Encounter (Signed)
Pt call complaining of hemorrhoid flare up x 2 days. Swelling and extreme discomfort, no blood. He would like for you to send in aprescription [facility] [address]

## 2017-06-24 NOTE — Telephone Encounter (Signed)
Pt asked to have someone return his call [phone].  He has hemorroids.

## 2017-06-26 ENCOUNTER — Encounter: Payer: Self-pay | Admitting: Family Medicine

## 2017-06-26 ENCOUNTER — Encounter: Payer: Self-pay | Admitting: Surgery

## 2017-06-26 ENCOUNTER — Ambulatory Visit (INDEPENDENT_AMBULATORY_CARE_PROVIDER_SITE_OTHER): Payer: Medicare Other | Admitting: Surgery

## 2017-06-26 ENCOUNTER — Ambulatory Visit (INDEPENDENT_AMBULATORY_CARE_PROVIDER_SITE_OTHER): Payer: Medicare Other | Admitting: Family Medicine

## 2017-06-26 VITALS — BP 133/76 | HR 72 | Temp 98.6°F | Resp 16 | Ht 68.5 in | Wt 222.2 lb

## 2017-06-26 VITALS — BP 173/83 | HR 70 | Temp 98.3°F | Ht 68.5 in | Wt 222.0 lb

## 2017-06-26 DIAGNOSIS — K642 Third degree hemorrhoids: Secondary | ICD-10-CM

## 2017-06-26 DIAGNOSIS — K645 Perianal venous thrombosis: Secondary | ICD-10-CM | POA: Diagnosis not present

## 2017-06-26 NOTE — Patient Instructions (Addendum)
Thank you for coming to the office today.  You have an inflamed and possibly thrombosed internal hemorrhoid, which involves swollen veins on your rectum, it is very sensitive and causes your severe pain. You may experience worsening pain and bleeding with bright red blood if the hemorrhoid develops a superficial blood clot.  [facility] Location [address] [place], [place]  [id number] Hours: 8:30am to 5pm (M-F) Ph [phone]  Appointment right now - Dr Burt Knack  - May try Anusol suppository twice a day as prescribed for 1 week, given a refill if needed for flare up - continue the warm bathtub soak 1-2 times daily for next week if you can, or can try the [facility] for just your bottom - Try to stay well hydrated, avoid constipation and straining, eat a high fiber diet   Please schedule a Follow-up Appointment to: Return if symptoms worsen or fail to improve, for hemorrhoid.  If you have any other questions or concerns, please feel free to call the office or send a message through [place]. You may also schedule an earlier appointment if necessary.  Additionally, you may be receiving a survey about your experience at our office within a few days to 1 week by e-mail or mail. We value your feedback.  [name], DO [place]

## 2017-06-26 NOTE — Progress Notes (Signed)
Curtis Gregory is an 67 y.o. male.   Chief Complaint: Thrombosed hemorrhoid HPI: This patient is consult was requested by Dr. Parks Ranger.  He was sent over urgently for a thrombosed hemorrhoid.  He has had pain for the last 48 hours and has been using Anusol to no effect.  He has had a colonoscopy.  He has no family history of colon cancer.  Past Medical History:  Diagnosis Date  . Asthma   . Benign prostatic hypertrophy with urinary frequency 06/16/2014  . Essential (primary) hypertension 06/16/2014  . Excessive urination at night 06/16/2014  . GERD (gastroesophageal reflux disease)   . Glaucoma   . Hyperlipemia   . Seizures ([place])    as child  . Sleep apnea     Past Surgical History:  Procedure Laterality Date  . COLONOSCOPY WITH PROPOFOL N/A 09/11/2016   Procedure: COLONOSCOPY WITH PROPOFOL;  Surgeon: Lucilla Lame, MD;  Location: [facility];  Service: Gastroenterology;  Laterality: N/A;  sleep apnea  . none    . POLYPECTOMY N/A 09/11/2016   Procedure: POLYPECTOMY;  Surgeon: Lucilla Lame, MD;  Location: [facility];  Service: Gastroenterology;  Laterality: N/A;    Family History  Problem Relation Age of Onset  . Diabetes Mother   . Heart disease Mother   . Alzheimer's disease Father   . Prostate cancer Father   . Kidney cancer Neg Hx    Social History:  reports that he has never smoked. He has never used smokeless tobacco. He reports that he drinks about 0.6 oz of alcohol per week. He reports that he does not use drugs.  Allergies: No Known Allergies   (Not in a hospital admission)   Review of Systems:   Review of Systems  Constitutional: Negative.   HENT: Negative.   Eyes: Negative.   Respiratory: Negative.   Cardiovascular: Negative.   Gastrointestinal: Negative for abdominal pain, heartburn, nausea and vomiting.  Genitourinary: Negative.   Musculoskeletal: Negative.   Skin: Negative.   Neurological: Negative.   Endo/Heme/Allergies: Negative.    Psychiatric/Behavioral: Negative.     Physical Exam:  Physical Exam  Constitutional: He is oriented to person, place, and time. He appears well-developed and well-nourished. No distress.  HENT:  Head: Normocephalic and atraumatic.  Eyes: Pupils are equal, round, and reactive to light. EOM are normal.  Neck: Normal range of motion. Neck supple.  Cardiovascular: Normal rate and regular rhythm.  Pulmonary/Chest: Effort normal. No respiratory distress.  Abdominal: Soft. He exhibits no distension. There is no tenderness.  Genitourinary:  Genitourinary Comments: Obvious thrombosed hemorrhoid.  Neurological: He is alert and oriented to person, place, and time.  Skin: He is not diaphoretic.  Vitals reviewed.   There were no vitals taken for this visit.    No results found for this or any previous visit (from the past 48 hour(s)). No results found.   Assessment/Plan Physical exam demonstrates and confirms the presence of a thrombosed hemorrhoid.  I discussed with him the treatment of a thrombosed hemorrhoid and the rationale for offering incision and drainage.  He may require excision at some point but this could give him temporizing relief.  The risks of not resolving his symptoms and the need for further surgery were reviewed he understood and agreed to proceed.  Informed consent was obtained a surgical pause was held and local anesthetic was infiltrated into the thrombosed hemorrhoid itself using aseptic technique.  With adequate local anesthesia in place and incision was made and clot was removed.  A sterile dressing was placed.  She tarted the procedure well he is instructed to continue using the suppository starting tomorrow and advised about probable drainage.  He will follow-up next week for recheck.  Florene Glen, MD, FACS

## 2017-06-26 NOTE — Patient Instructions (Signed)
Hemorrhoids    Hemorrhoids are swollen veins in and around the rectum or anus. Hemorrhoids can cause pain, itching, or bleeding. Most of the time, they do not cause serious problems. They usually get better with diet changes, lifestyle changes, and other home treatments.  Follow these instructions at home:  Eating and drinking  · Eat foods that have fiber, such as whole grains, beans, nuts, fruits, and vegetables. Ask your doctor about taking products that have added fiber (fiber supplements).  · Drink enough fluid to keep your pee (urine) clear or pale yellow.  For Pain and Swelling  · Take a warm-water bath (sitz bath) for 20 minutes to ease pain. Do this 3-4 times a day.  · If directed, put ice on the painful area. It may be helpful to use ice between your warm baths.  ¨ Put ice in a plastic bag.  ¨ Place a towel between your skin and the bag.  ¨ Leave the ice on for 20 minutes, 2-3 times a day.  General instructions  · Take over-the-counter and prescription medicines only as told by your doctor.  ¨ Medicated creams and medicines that are inserted into the anus (suppositories) may be used or applied as told.  · Exercise often.  · Go to the bathroom when you have the urge to poop (to have a bowel movement). Do not wait.  · Avoid pushing too hard (straining) when you poop.  · Keep the butt area dry and clean. Use wet toilet paper or moist paper towels.  · Do not sit on the toilet for a long time.  Contact a doctor if:  · You have any of these:  ¨ Pain and swelling that do not get better with treatment or medicine.  ¨ Bleeding that will not stop.  ¨ Trouble pooping or you cannot poop.  ¨ Pain or swelling outside the area of the hemorrhoids.  This information is not intended to replace advice given to you by your health care provider. Make sure you discuss any questions you have with your health care provider.  Document Released: 10/09/2007 Document Revised: 06/07/2015 Document Reviewed: 09/13/2014  Elsevier  Interactive Patient Education © 2018 Elsevier Inc.   

## 2017-06-26 NOTE — Progress Notes (Signed)
Subjective:    Patient ID: Curtis Gregory, male    DOB: 10/24/1950, 67 y.o.   MRN: [id number]  Curtis Gregory is a 67 y.o. male presenting on 06/26/2017 for Hemorrhoids   HPI   FOLLOW-UP Hemorrhoids, Internal, Suspected Thrombosed History of hemorrhoids over past 6-8 months, seemed to be having worsening episodes, current flare is worst, now he has a "knot", normally he will have episodes of bleeding, but current flare is different. He had prior colonoscopy [facility] Dr Allen Norris, 09/11/16, Dr Allen Norris identified some non bleeding internal hemorrhoids - Now acute flare onset about 2-3 days ago with swelling, enlargement and pain with small "protruding mass" without any bleeding currently. He has not tried any soaks in warm water or other home remedy. He tried the hemorrhoid cream OTC without relief, and then recently called office on 6/12 and was phoned in [facility] suppository without any relief - Admits rectal pain, some constipation due to pain - Denies any active rectal bleeding, diarrhea, abdominal pain, near syncope  Depression screen [facility] 2/9 06/03/2017 10/06/2016 10/18/2014  Decreased Interest 0 0 0  Down, Depressed, Hopeless 0 0 0  PHQ - 2 Score 0 0 0    Social History   Tobacco Use  . Smoking status: Never Smoker  . Smokeless tobacco: Never Used  Substance Use Topics  . Alcohol use: Yes    Alcohol/week: 0.6 oz    Types: 1 Glasses of wine per week    Comment: occasional  . Drug use: No    Review of Systems Per HPI unless specifically indicated above     Objective:    BP 133/76   Pulse 72   Temp 98.6 F (37 C) (Oral)   Resp 16   Ht 5' 8.5" (1.74 m)   Wt 222 lb 3.2 oz (100.8 kg)   BMI 33.29 kg/m   Wt Readings from Last 3 Encounters:  06/26/17 222 lb 3.2 oz (100.8 kg)  06/03/17 226 lb 6.4 oz (102.7 kg)  02/11/17 229 lb (103.9 kg)    Physical Exam  Constitutional: He is oriented to person, place, and time. He appears well-developed and well-nourished. No distress.    Well-appearing, uncomfortable due to hemorrhoids, cooperative  Cardiovascular: Normal rate.  Pulmonary/Chest: Effort normal.  Genitourinary:  Genitourinary Comments: Rectal/DRE: External exam with large swollen protruding suspected internal hemorrhoid appears likely thrombosed with some pain on palpation with DRE deeper feeling extension of hemorrhoid. Questionable if external hemorrhoid as well. No evidence of anal fissure or active bleeding.  Musculoskeletal: He exhibits no edema.  Neurological: He is alert and oriented to person, place, and time.  Skin: Skin is warm and dry. No rash noted. He is not diaphoretic. No erythema.  Psychiatric: His behavior is normal.  Well groomed, good eye contact, normal speech and thoughts  Nursing note and vitals reviewed.  Results for orders placed or performed in visit on 05/22/16  COMPLETE METABOLIC PANEL WITH GFR  Result Value Ref Range   Sodium 141 135 - 146 mmol/L   Potassium 4.4 3.5 - 5.3 mmol/L   Chloride 107 98 - 110 mmol/L   CO2 25 20 - 31 mmol/L   Glucose, Bld 96 65 - 99 mg/dL   BUN 17 7 - 25 mg/dL   Creat 1.15 0.70 - 1.25 mg/dL   Total Bilirubin 0.7 0.2 - 1.2 mg/dL   Alkaline Phosphatase 53 40 - 115 U/L   AST 21 10 - 35 U/L   ALT 13 9 - 46 U/L  Total Protein 6.2 6.1 - 8.1 g/dL   Albumin 3.6 3.6 - 5.1 g/dL   Calcium 8.3 (L) 8.6 - 10.3 mg/dL   GFR, Est African American 77 >=60 mL/min   GFR, Est Non African American 67 >=60 mL/min  Hemoglobin A1c  Result Value Ref Range   Hgb A1c MFr Bld 5.6 <5.7 %   Mean Plasma Glucose 114 mg/dL  CBC with Differential/Platelet  Result Value Ref Range   WBC 4.1 3.8 - 10.8 K/uL   RBC 4.83 4.20 - 5.80 MIL/uL   Hemoglobin 14.7 13.2 - 17.1 g/dL   HCT 44.4 38.5 - 50.0 %   MCV 91.9 80.0 - 100.0 fL   MCH 30.4 27.0 - 33.0 pg   MCHC 33.1 32.0 - 36.0 g/dL   RDW 13.8 11.0 - 15.0 %   Platelets 225 140 - 400 K/uL   MPV 8.8 7.5 - 12.5 fL   Neutro Abs 1,476 (L) 1,500 - 7,800 cells/uL   Lymphs Abs 2,009  850 - 3,900 cells/uL   Monocytes Absolute 369 200 - 950 cells/uL   Eosinophils Absolute 205 15 - 500 cells/uL   Basophils Absolute 41 0 - 200 cells/uL   Neutrophils Relative % 36 %   Lymphocytes Relative 49 %   Monocytes Relative 9 %   Eosinophils Relative 5 %   Basophils Relative 1 %   Smear Review Criteria for review not met   Lipid panel  Result Value Ref Range   Cholesterol 152 <200 mg/dL   Triglycerides 52 <150 mg/dL   HDL 47 >40 mg/dL   Total CHOL/HDL Ratio 3.2 <5.0 Ratio   VLDL 10 <30 mg/dL   LDL Cholesterol 95 <100 mg/dL  PSA, Total with Reflex to PSA, Free  Result Value Ref Range   PSA, Total 0.4 <=4.0 ng/mL  Hepatitis C antibody  Result Value Ref Range   HCV Ab NEGATIVE NEGATIVE  HIV antibody  Result Value Ref Range   HIV 1&2 Ab, 4th Generation NONREACTIVE NONREACTIVE      Assessment & Plan:   Problem List Items Addressed This Visit    None    Visit Diagnoses    Internal thrombosed hemorrhoids    -  Primary   Relevant Orders   Ambulatory referral to General Surgery     Acute left inflamed presumed to be internal hemorrhoid (9 o clock position). Concern appear may be thrombosed, without active bleeding. Uncertain if additional external hemorrhoid difficulty with exam due to tenderness No evidence of anal fissure or any perineal problems, no sign of erythema or cellulitis. - Failed topical preparation H OTC and Anusol HC Suppository  Plan: 1. Urgent referral today to [facility] - Dr Burt Knack - called ahead and confirmed apt for right now approx 1030am at [place] location, patient advised to go directly there, may need hemorrhoidectomy or may ultimately return for more definitive procedure - Recommend may use suppository in future - Start Sitz Baths or warm bathtub soaks to help resolve flare - Avoid constipation and straining, recommend high fiber diet, improve hydration  Reviewed return criteria if not improving    Orders Placed This  Encounter  Procedures  . Ambulatory referral to General Surgery    Referral Priority:   Urgent    Referral Type:   Surgical    Referral Reason:   Specialty Services Required    Referred to Provider:   Florene Glen, MD    Requested Specialty:   General Surgery    Number  of Visits Requested:   1     No orders of the defined types were placed in this encounter.   Follow up plan: Return if symptoms worsen or fail to improve, for hemorrhoid.  [name], [facility] 06/26/2017, 10:18 AM

## 2017-07-01 ENCOUNTER — Other Ambulatory Visit: Payer: Self-pay

## 2017-07-01 DIAGNOSIS — K644 Residual hemorrhoidal skin tags: Secondary | ICD-10-CM

## 2017-07-01 MED ORDER — HYDROCORTISONE ACETATE 25 MG RE SUPP: 25.0000 mg | Freq: Two times a day (BID) | RECTAL | 2 refills | Status: DC

## 2017-07-01 MED ORDER — HYDROCORTISONE ACETATE 25 MG RE SUPP
25.0000 mg | Freq: Two times a day (BID) | RECTAL | 2 refills | Status: DC
Start: 2017-07-01 — End: 2017-07-01

## 2017-07-01 NOTE — Telephone Encounter (Signed)
The pt call stating he need a refill on the Anusol suppository. The pt was seen by general surgery and had a HEMORRHOID EXTERNAL INCISION THROMBOSED done in the office. He state he was instructed to continue using the suppository.

## 2017-07-08 ENCOUNTER — Ambulatory Visit: Payer: Self-pay | Admitting: Surgery

## 2017-07-09 ENCOUNTER — Ambulatory Visit: Payer: Self-pay | Admitting: Surgery

## 2017-07-14 ENCOUNTER — Ambulatory Visit (INDEPENDENT_AMBULATORY_CARE_PROVIDER_SITE_OTHER): Payer: Medicare Other | Admitting: Surgery

## 2017-07-14 ENCOUNTER — Encounter: Payer: Self-pay | Admitting: Surgery

## 2017-07-14 VITALS — BP 137/81 | HR 67 | Temp 98.0°F | Wt 228.0 lb

## 2017-07-14 DIAGNOSIS — K642 Third degree hemorrhoids: Secondary | ICD-10-CM

## 2017-07-14 NOTE — Patient Instructions (Signed)
Please continue to keep yourself well hydrated.   Eat more foods high in fiber.  Continue using suppositories at least once a week to help you with any constipation.  Use Preparation-H regularly to help the swelling of your hemorrhoids.  Please give [place] a call in case you have any questions or concerns.  HAPPY BIRTHDAY!!!

## 2017-07-14 NOTE — Progress Notes (Signed)
Patient feels well and states he is much much better and he was prior to having his hemorrhoid lanced.  He is using suppositories.  Has had colonoscopies in the past most recently 6 months ago.  Exam shows no erythema nontender and complete collapse of the previously lanced hemorrhoid.  Patient doing very well recommend follow-up on an as-needed basis.  I discussed bowel regimens with him.

## 2017-07-25 ENCOUNTER — Other Ambulatory Visit: Payer: Self-pay | Admitting: Family Medicine

## 2017-07-25 DIAGNOSIS — K219 Gastro-esophageal reflux disease without esophagitis: Secondary | ICD-10-CM

## 2017-07-25 DIAGNOSIS — E785 Hyperlipidemia, unspecified: Secondary | ICD-10-CM

## 2017-08-13 ENCOUNTER — Ambulatory Visit: Payer: Medicare Other

## 2017-08-17 ENCOUNTER — Ambulatory Visit: Payer: Medicare Other | Admitting: Urology

## 2017-08-20 DIAGNOSIS — H02055 Trichiasis without entropian left lower eyelid: Secondary | ICD-10-CM | POA: Diagnosis not present

## 2017-08-20 DIAGNOSIS — H2513 Age-related nuclear cataract, bilateral: Secondary | ICD-10-CM | POA: Diagnosis not present

## 2017-08-20 DIAGNOSIS — H401132 Primary open-angle glaucoma, bilateral, moderate stage: Secondary | ICD-10-CM | POA: Diagnosis not present

## 2017-08-20 DIAGNOSIS — H18413 Arcus senilis, bilateral: Secondary | ICD-10-CM | POA: Diagnosis not present

## 2017-08-25 ENCOUNTER — Encounter: Payer: Self-pay | Admitting: Family Medicine

## 2017-08-25 ENCOUNTER — Ambulatory Visit (INDEPENDENT_AMBULATORY_CARE_PROVIDER_SITE_OTHER): Payer: Medicare Other | Admitting: Family Medicine

## 2017-08-25 DIAGNOSIS — R05 Cough: Secondary | ICD-10-CM

## 2017-08-25 DIAGNOSIS — R059 Cough, unspecified: Secondary | ICD-10-CM

## 2017-08-25 DIAGNOSIS — J019 Acute sinusitis, unspecified: Secondary | ICD-10-CM | POA: Diagnosis not present

## 2017-08-25 MED ORDER — IPRATROPIUM BROMIDE 0.06 % NA SOLN: 2.0000 | Freq: Four times a day (QID) | NASAL | 0 refills | Status: DC

## 2017-08-25 MED ORDER — PSEUDOEPH-BROMPHEN-DM 30-2-10 MG/5ML PO SYRP: 5.0000 mL | ORAL_SOLUTION | Freq: Four times a day (QID) | ORAL | 0 refills | Status: DC | PRN

## 2017-08-25 NOTE — Patient Instructions (Addendum)
Thank you for coming to the office today.  1. It sounds like you have persistent Sinus Congestion or "Rhinosinusitis" - I do not think that this is a Bacterial Sinus Infection. Usually these are caused by Viruses or Allergies, and will run it's course in about 7 to 10 days. - No antibiotics are needed - Restart Allegra allergy pill 180mg  daily - HOLD Flonase - (med at home) Start Atrovent nasal spray decongestant 2 sprays in each nostril up to 4 times daily for 7 days - THEN after 5-7 days restart Flonase 2 sprays in each nostril daily for next 4-6 weeks, then you may stop and use seasonally or as needed  Start cough syrup as needed  - Recommend to start using Nasal Saline spray multiple times a day to help flush out congestion and clear sinuses - Improve hydration by drinking plenty of clear fluids (water, gatorade) to reduce secretions and thin congestion - Congestion draining down throat can cause irritation. May try warm herbal tea with honey, cough drops - Can take Tylenol or Ibuprofen as needed for fevers  May try mucinex sinus for 1 week  Call us back within 2-3 days if not improved or worse or fevers - can add Amoxicillin again if need  If you develop persistent fever >101F for at least 3 consecutive days, headaches with sinus pain or pressure or persistent earache, please schedule a follow-up evaluation within next few days to week.   - All Medicare plans should cover an "Annual Medicare Wellness" (performed by a nurse health advisor, not doctor) - this is FREE and can be done once a year   DUE for FASTING BLOOD WORK (no food or drink after midnight before the lab appointment, only water or coffee without cream/sugar on the morning of)  SCHEDULE "Lab Only" visit in the morning at the clinic for lab draw in 2-3 WEEKS   - Make sure Lab Only appointment is at about 1 week before your next appointment, so that results will be available  For Lab Results, once available within  2-3 days of blood draw, you can can log in to MyChart online to view your results and a brief explanation. Also, we can discuss results at next follow-up visit.   Please schedule a Follow-up Appointment to: Return in about 3 weeks (around 09/15/2017) for Yearly Medicare Check-up - and [place].  If you have any other questions or concerns, please feel free to call the office or send a message through [place]. You may also schedule an earlier appointment if necessary.  Additionally, you may be receiving a survey about your experience at our office within a few days to 1 week by e-mail or mail. We value your feedback.  [name], DO [place]

## 2017-08-25 NOTE — Progress Notes (Signed)
Subjective:    Patient ID: Curtis Gregory, male    DOB: [birth year], 67 y.o.   MRN: [id number]  Curtis Gregory is a 67 y.o. male presenting on 08/25/2017 for Sore Throat (as per patient stuffy nose ,coughing onset 3 days)   HPI   ACUTE RHINOSINUSITIS / Allergic rhinitis - Last visit with me for similar problem in 05/2017 , treated with antibiotic and flonase with cough syrup with improvement, see prior notes for background information. - Interval update with known chronic allergies, worse summer/spring/fall, he has stopped all allergy med in interval after improving and now recurrent symptoms - Today patient reports same problem as before, now 3 days with symptom onset sore scratchy throat, congestion sinus pressure and fullness some sinus pain with some cough at times non productive - No known sick contacts - Not tried mucinex this time. - Additionally he is on CPAP machine for OSA since 12/2016 - thinks some sore throat due to this - Denies any fevers, chills, cough, productive cough, headache, rash  Health Maintenance: Due for flu shot, will return when we have in stock.  Depression screen [facility] 2/9 08/25/2017 06/03/2017 10/06/2016  Decreased Interest 0 0 0  Down, Depressed, Hopeless 0 0 0  PHQ - 2 Score 0 0 0    Social History   Tobacco Use  . Smoking status: Never Smoker  . Smokeless tobacco: Never Used  Substance Use Topics  . Alcohol use: Yes    Alcohol/week: 1.0 standard drinks    Types: 1 Glasses of wine per week    Comment: occasional  . Drug use: No    Review of Systems Per HPI unless specifically indicated above     Objective:    BP 130/65   Pulse 70   Temp 98.3 F (36.8 C) (Oral)   Resp 16   Ht 5' 8.5" (1.74 m)   Wt 228 lb (103.4 kg)   SpO2 100%   BMI 34.16 kg/m   Wt Readings from Last 3 Encounters:  08/25/17 228 lb (103.4 kg)  07/14/17 228 lb (103.4 kg)  06/26/17 222 lb (100.7 kg)    Physical Exam  Constitutional: He is oriented to person, place, and  time. He appears well-developed and well-nourished. No distress.  Currently well appearing, comfortable with some congestion, cooperative  HENT:  Head: Normocephalic and atraumatic.  Mouth/Throat: Oropharynx is clear and moist.  Frontal / maxillary sinuses non-tender. Nares with persistent turbinate edema with congestion without purulence. Bilateral TMs with still clear effusion and fullness without erythema or bulging. Oropharynx with mild generalized posterior erythema without exudates, edema or asymmetry.  Eyes: Conjunctivae are normal. Right eye exhibits no discharge. Left eye exhibits no discharge.  Neck: Normal range of motion. Neck supple.  Cardiovascular: Normal rate, regular rhythm, normal heart sounds and intact distal pulses.  No murmur heard. Pulmonary/Chest: Effort normal and breath sounds normal. No respiratory distress. He has no wheezes. He has no rales.  Good air movement  Lymphadenopathy:    He has no cervical adenopathy.  Neurological: He is alert and oriented to person, place, and time.  Skin: Skin is warm and dry. No rash noted. He is not diaphoretic. No erythema.  Psychiatric: He has a normal mood and affect. His behavior is normal.  Well groomed, good eye contact, normal speech and thoughts  Nursing note and vitals reviewed.  Results for orders placed or performed in visit on 05/22/16  COMPLETE METABOLIC PANEL WITH GFR  Result Value Ref Range  Sodium 141 135 - 146 mmol/L   Potassium 4.4 3.5 - 5.3 mmol/L   Chloride 107 98 - 110 mmol/L   CO2 25 20 - 31 mmol/L   Glucose, Bld 96 65 - 99 mg/dL   BUN 17 7 - 25 mg/dL   Creat 1.15 0.70 - 1.25 mg/dL   Total Bilirubin 0.7 0.2 - 1.2 mg/dL   Alkaline Phosphatase 53 40 - 115 U/L   AST 21 10 - 35 U/L   ALT 13 9 - 46 U/L   Total Protein 6.2 6.1 - 8.1 g/dL   Albumin 3.6 3.6 - 5.1 g/dL   Calcium 8.3 (L) 8.6 - 10.3 mg/dL   GFR, Est African American 77 >=60 mL/min   GFR, Est Non African American 67 >=60 mL/min  Hemoglobin  A1c  Result Value Ref Range   Hgb A1c MFr Bld 5.6 <5.7 %   Mean Plasma Glucose 114 mg/dL  CBC with Differential/Platelet  Result Value Ref Range   WBC 4.1 3.8 - 10.8 K/uL   RBC 4.83 4.20 - 5.80 MIL/uL   Hemoglobin 14.7 13.2 - 17.1 g/dL   HCT 44.4 38.5 - 50.0 %   MCV 91.9 80.0 - 100.0 fL   MCH 30.4 27.0 - 33.0 pg   MCHC 33.1 32.0 - 36.0 g/dL   RDW 13.8 11.0 - 15.0 %   Platelets 225 140 - 400 K/uL   MPV 8.8 7.5 - 12.5 fL   Neutro Abs 1,476 (L) 1,500 - 7,800 cells/uL   Lymphs Abs 2,009 850 - 3,900 cells/uL   Monocytes Absolute 369 200 - 950 cells/uL   Eosinophils Absolute 205 15 - 500 cells/uL   Basophils Absolute 41 0 - 200 cells/uL   Neutrophils Relative % 36 %   Lymphocytes Relative 49 %   Monocytes Relative 9 %   Eosinophils Relative 5 %   Basophils Relative 1 %   Smear Review Criteria for review not met   Lipid panel  Result Value Ref Range   Cholesterol 152 <200 mg/dL   Triglycerides 52 <150 mg/dL   HDL 47 >40 mg/dL   Total CHOL/HDL Ratio 3.2 <5.0 Ratio   VLDL 10 <30 mg/dL   LDL Cholesterol 95 <100 mg/dL  PSA, Total with Reflex to PSA, Free  Result Value Ref Range   PSA, Total 0.4 <=4.0 ng/mL  Hepatitis C antibody  Result Value Ref Range   HCV Ab NEGATIVE NEGATIVE  HIV antibody  Result Value Ref Range   HIV 1&2 Ab, 4th Generation NONREACTIVE NONREACTIVE      Assessment & Plan:   Problem List Items Addressed This Visit    None    Visit Diagnoses    Acute rhinosinusitis       Relevant Medications   ipratropium (ATROVENT) 0.06 % nasal spray   brompheniramine-pseudoephedrine-DM 30-2-10 MG/5ML syrup   Cough       Relevant Medications   brompheniramine-pseudoephedrine-DM 30-2-10 MG/5ML syrup      Consistent with acute rhinosinusitis, likely initially viral URI vs allergic rhinitis component without evidence of acute bacterial infection.  Plan: 1. Reassurance, likely self-limited - no indication for antibiotics at this time 2. Resume Allegra 3. Start  Atrovent nasal spray decongestant 2 sprays in each nostril up to 4 times daily for 7 days 4. After 3-5 days then Start nasal steroid Flonase 2 sprays in each nostril daily for 4-6 weeks, may repeat course seasonally or as needed 5. Cough syrup rx 6. Supportive care with nasal saline OTC, hydration  Return criteria reviewed - if not improve or worsening clinically for recurrent sinusitis within 2-3 days then contact office would consider sending Amoxicillin vs Augmentin to pharmacy   Meds ordered this encounter  Medications  . ipratropium (ATROVENT) 0.06 % nasal spray    Sig: Place 2 sprays into both nostrils 4 (four) times daily. For up to 5-7 days then stop.    Dispense:  15 mL    Refill:  0  . brompheniramine-pseudoephedrine-DM 30-2-10 MG/5ML syrup    Sig: Take 5 mLs by mouth 4 (four) times daily as needed.    Dispense:  118 mL    Refill:  0    Follow up plan: Return in about 3 weeks (around 09/15/2017) for Yearly Medicare Check-up - and [facility].  Future labs ordered for 09/08/17  [name], [facility] 08/25/2017, 1:36 PM

## 2017-08-26 ENCOUNTER — Other Ambulatory Visit: Payer: Self-pay | Admitting: Family Medicine

## 2017-08-26 ENCOUNTER — Telehealth: Payer: Self-pay

## 2017-08-26 DIAGNOSIS — R635 Abnormal weight gain: Secondary | ICD-10-CM

## 2017-08-26 DIAGNOSIS — K219 Gastro-esophageal reflux disease without esophagitis: Secondary | ICD-10-CM

## 2017-08-26 DIAGNOSIS — R350 Frequency of micturition: Secondary | ICD-10-CM

## 2017-08-26 DIAGNOSIS — E782 Mixed hyperlipidemia: Secondary | ICD-10-CM

## 2017-08-26 DIAGNOSIS — G4733 Obstructive sleep apnea (adult) (pediatric): Secondary | ICD-10-CM

## 2017-08-26 DIAGNOSIS — R7309 Other abnormal glucose: Secondary | ICD-10-CM

## 2017-08-26 DIAGNOSIS — N401 Enlarged prostate with lower urinary tract symptoms: Secondary | ICD-10-CM

## 2017-08-26 DIAGNOSIS — J019 Acute sinusitis, unspecified: Secondary | ICD-10-CM

## 2017-08-26 DIAGNOSIS — E559 Vitamin D deficiency, unspecified: Secondary | ICD-10-CM

## 2017-08-26 DIAGNOSIS — I10 Essential (primary) hypertension: Secondary | ICD-10-CM

## 2017-08-26 NOTE — Telephone Encounter (Signed)
Patient was seen yesterday and advised to call back if he is not feeling better within 2-3 days. Received phone call within day stating his Sxs are not improving and getting worst, wanted antibiotics to be Rx.

## 2017-08-27 MED ORDER — AMOXICILLIN 500 MG PO CAPS: 500.0000 mg | ORAL_CAPSULE | Freq: Two times a day (BID) | ORAL | 0 refills | Status: DC

## 2017-08-27 NOTE — Telephone Encounter (Signed)
See note from 08/25/17, advised if not improve can send amoxicillin by 2-3 days. Will send rx today to [facility].  Please notify patient he may pick up Amoxicillin for 7 days at [place], [facility] 08/27/2017, 8:58 AM

## 2017-08-27 NOTE — Telephone Encounter (Signed)
Patient informed. 

## 2017-09-01 ENCOUNTER — Encounter: Payer: Self-pay | Admitting: Family Medicine

## 2017-09-01 ENCOUNTER — Ambulatory Visit (INDEPENDENT_AMBULATORY_CARE_PROVIDER_SITE_OTHER): Payer: Medicare Other | Admitting: Family Medicine

## 2017-09-01 VITALS — BP 115/61 | HR 80 | Temp 98.2°F | Resp 16 | Ht 68.5 in | Wt 228.6 lb

## 2017-09-01 DIAGNOSIS — J4521 Mild intermittent asthma with (acute) exacerbation: Secondary | ICD-10-CM

## 2017-09-01 DIAGNOSIS — J019 Acute sinusitis, unspecified: Secondary | ICD-10-CM | POA: Diagnosis not present

## 2017-09-01 MED ORDER — PREDNISONE 50 MG PO TABS: 50.0000 mg | ORAL_TABLET | Freq: Every day | ORAL | 0 refills | Status: DC

## 2017-09-01 NOTE — Addendum Note (Signed)
Addended by: Olin Hauser on: 09/01/2017 03:30 PM   Modules accepted: Orders

## 2017-09-01 NOTE — Patient Instructions (Addendum)
Thank you for coming to the office today.  Start Prednisone 50mg  daily for 5 days  Finish Amoxicillin antibiotic  Try to get better rest if possible if we can control cough to help you sleep  May use Albuterol inhaler as needed  Continue Flonase daily  Keep your apt in future for labs and to see Tiffany for Medicare Wellness  -------------------------------------  If not improving in next 2-3 days - may notify office and we can consider stronger antibiotic Levaquin if needed   DUE for FASTING BLOOD WORK (no food or drink after midnight before the lab appointment, only water or coffee without cream/sugar on the morning of)  SCHEDULE "Lab Only" visit in the morning at the clinic for lab draw in 1 WEEK   Please schedule a Follow-up Appointment to: Return in about 2 weeks (around 09/15/2017) for Yearly Medicare Check-up with me (already has AMW and labs scheduled).  If you have any other questions or concerns, please feel free to call the office or send a message through [place]. You may also schedule an earlier appointment if necessary.  Additionally, you may be receiving a survey about your experience at our office within a few days to 1 week by e-mail or mail. We value your feedback.  [name], DO [place]

## 2017-09-01 NOTE — Progress Notes (Signed)
Subjective:    Patient ID: Curtis Gregory, male    DOB: [birth year], 67 y.o.   MRN: [id number]  Curtis Gregory is a 67 y.o. male presenting on 09/01/2017 for Cough (still has scratchy throat and exhaustion)   HPI  FOLLOW-UP ACUTE RHINOSINUSITIS / Allergic rhinitis - Last visit with me for similar problem in 08/25/17 , treated with atrovent nasal, continued flonase and allegra, eventually started on Amoxicillin course for 10 days after he called back without improvement, see prior note for background. - Today reports seemed to improve but then worsen again. He still has 1 more day of Amoxicillin antibiotic. He has history of asthma and concerns now with persistent coughing productive worse at night, not using albuterol inhaler, not using CPAP for past 3-4 nights due to cough. He feels more tired and fatigued. Also asking about sensation in back of throat with irritation - Still taking Allegra, Flonase. Tried OTC medicine - No known sick contacts Admits drained energy - Denies any fevers, chills, headache, rash, nausea vomiting, edema  Depression screen [facility] 2/9 09/01/2017 08/25/2017 06/03/2017  Decreased Interest 0 0 0  Down, Depressed, Hopeless 0 0 0  PHQ - 2 Score 0 0 0    Social History   Tobacco Use  . Smoking status: Never Smoker  . Smokeless tobacco: Never Used  Substance Use Topics  . Alcohol use: Yes    Alcohol/week: 1.0 standard drinks    Types: 1 Glasses of wine per week    Comment: occasional  . Drug use: No    Review of Systems Per HPI unless specifically indicated above     Objective:    BP 115/61   Pulse 80   Temp 98.2 F (36.8 C) (Oral)   Resp 16   Ht 5' 8.5" (1.74 m)   Wt 228 lb 9.6 oz (103.7 kg)   SpO2 100%   BMI 34.25 kg/m   Wt Readings from Last 3 Encounters:  09/01/17 228 lb 9.6 oz (103.7 kg)  08/25/17 228 lb (103.4 kg)  07/14/17 228 lb (103.4 kg)    Physical Exam  Constitutional: He is oriented to person, place, and time. He appears well-developed  and well-nourished. No distress.  Currently well appearing, comfortable with some congestion, cooperative  HENT:  Head: Normocephalic and atraumatic.  Mouth/Throat: Oropharynx is clear and moist.  Frontal / maxillary sinuses non-tender. Nares with persistent turbinate edema with congestion without purulence. Bilateral TMs with still clear effusion and fullness without erythema or bulging - seems slightly improved from last visit.  Still Oropharynx with mild generalized posterior erythema without exudates, edema or asymmetry.  Limited changes in past week  Eyes: Conjunctivae are normal. Right eye exhibits no discharge. Left eye exhibits no discharge.  Neck: Normal range of motion. Neck supple.  Cardiovascular: Normal rate, regular rhythm, normal heart sounds and intact distal pulses.  No murmur heard. Pulmonary/Chest: Effort normal and breath sounds normal. No respiratory distress. He has no wheezes. He has no rales.  Good air movement. No coughing or focal wheezing.  Lymphadenopathy:    He has no cervical adenopathy.  Neurological: He is alert and oriented to person, place, and time.  Skin: Skin is warm and dry. No rash noted. He is not diaphoretic. No erythema.  Psychiatric: He has a normal mood and affect. His behavior is normal.  Well groomed, good eye contact, normal speech and thoughts  Nursing note and vitals reviewed.  Results for orders placed or performed in visit on 05/22/16  COMPLETE METABOLIC PANEL WITH GFR  Result Value Ref Range   Sodium 141 135 - 146 mmol/L   Potassium 4.4 3.5 - 5.3 mmol/L   Chloride 107 98 - 110 mmol/L   CO2 25 20 - 31 mmol/L   Glucose, Bld 96 65 - 99 mg/dL   BUN 17 7 - 25 mg/dL   Creat 1.15 0.70 - 1.25 mg/dL   Total Bilirubin 0.7 0.2 - 1.2 mg/dL   Alkaline Phosphatase 53 40 - 115 U/L   AST 21 10 - 35 U/L   ALT 13 9 - 46 U/L   Total Protein 6.2 6.1 - 8.1 g/dL   Albumin 3.6 3.6 - 5.1 g/dL   Calcium 8.3 (L) 8.6 - 10.3 mg/dL   GFR, Est African  American 77 >=60 mL/min   GFR, Est Non African American 67 >=60 mL/min  Hemoglobin A1c  Result Value Ref Range   Hgb A1c MFr Bld 5.6 <5.7 %   Mean Plasma Glucose 114 mg/dL  CBC with Differential/Platelet  Result Value Ref Range   WBC 4.1 3.8 - 10.8 K/uL   RBC 4.83 4.20 - 5.80 MIL/uL   Hemoglobin 14.7 13.2 - 17.1 g/dL   HCT 44.4 38.5 - 50.0 %   MCV 91.9 80.0 - 100.0 fL   MCH 30.4 27.0 - 33.0 pg   MCHC 33.1 32.0 - 36.0 g/dL   RDW 13.8 11.0 - 15.0 %   Platelets 225 140 - 400 K/uL   MPV 8.8 7.5 - 12.5 fL   Neutro Abs 1,476 (L) 1,500 - 7,800 cells/uL   Lymphs Abs 2,009 850 - 3,900 cells/uL   Monocytes Absolute 369 200 - 950 cells/uL   Eosinophils Absolute 205 15 - 500 cells/uL   Basophils Absolute 41 0 - 200 cells/uL   Neutrophils Relative % 36 %   Lymphocytes Relative 49 %   Monocytes Relative 9 %   Eosinophils Relative 5 %   Basophils Relative 1 %   Smear Review Criteria for review not met   Lipid panel  Result Value Ref Range   Cholesterol 152 <200 mg/dL   Triglycerides 52 <150 mg/dL   HDL 47 >40 mg/dL   Total CHOL/HDL Ratio 3.2 <5.0 Ratio   VLDL 10 <30 mg/dL   LDL Cholesterol 95 <100 mg/dL  PSA, Total with Reflex to PSA, Free  Result Value Ref Range   PSA, Total 0.4 <=4.0 ng/mL  Hepatitis C antibody  Result Value Ref Range   HCV Ab NEGATIVE NEGATIVE  HIV antibody  Result Value Ref Range   HIV 1&2 Ab, 4th Generation NONREACTIVE NONREACTIVE      Assessment & Plan:   Problem List Items Addressed This Visit    Asthma, mild intermittent   Relevant Medications   predniSONE (DELTASONE) 50 MG tablet    Other Visit Diagnoses    Acute rhinosinusitis    -  Primary   Relevant Medications   predniSONE (DELTASONE) 50 MG tablet      Clinically significant for possible recurrent vs unresolved acute rhinosinusitis, seems more allergic trigger for symptoms however has improved on initial course antibiotic amoxicillin, now has persistent sinus / eustachian tube dysfunction  symptoms and drainage and sore throat. - Concerning for possible asthma exacerbation likely mild without active wheezing currently but increased cough worse at night - inadequate treatment  Plan Start Prednisone 45m daily x 5 days both for respiratory status / coughing possible asthma involvement and also for sinusitis to reduce sinus pressure symptoms and eustachian  tube clearing - Continue anti histamine daily, did not seem to take prior to onset symptoms, explained may be less effective - Continue Flonase will take longer for full effect - Finish last day of Amoxicillin - no change to antibiotics at this time, may reconsider switch to alternative such as Levaquin in 2-3 days if not improved - Use albuterol inhaler PRN if asthma cough Follow-up as planned within 1-2 weeks for yearly and labs, to address issue of fatigue and drained, suspected related to current illness and less likely underlying etiology as discussed  Meds ordered this encounter  Medications  . predniSONE (DELTASONE) 50 MG tablet    Sig: Take 1 tablet (50 mg total) by mouth daily with breakfast.    Dispense:  5 tablet    Refill:  0    Follow up plan: Return in about 2 weeks (around 09/15/2017) for Yearly Medicare Check-up with me (already has [facility] and labs scheduled).  Future labs ordered for 09/08/17 already, added TSH, Free T4 and Vitamin D to list.  [name], DO [facility] 09/01/2017, 8:32 PM

## 2017-09-02 ENCOUNTER — Other Ambulatory Visit: Payer: Self-pay | Admitting: Family Medicine

## 2017-09-02 DIAGNOSIS — J019 Acute sinusitis, unspecified: Secondary | ICD-10-CM

## 2017-09-08 ENCOUNTER — Other Ambulatory Visit: Payer: Medicare Other

## 2017-09-08 DIAGNOSIS — K219 Gastro-esophageal reflux disease without esophagitis: Secondary | ICD-10-CM | POA: Diagnosis not present

## 2017-09-08 DIAGNOSIS — E559 Vitamin D deficiency, unspecified: Secondary | ICD-10-CM | POA: Diagnosis not present

## 2017-09-08 DIAGNOSIS — R350 Frequency of micturition: Secondary | ICD-10-CM

## 2017-09-08 DIAGNOSIS — E782 Mixed hyperlipidemia: Secondary | ICD-10-CM | POA: Diagnosis not present

## 2017-09-08 DIAGNOSIS — R7309 Other abnormal glucose: Secondary | ICD-10-CM

## 2017-09-08 DIAGNOSIS — R635 Abnormal weight gain: Secondary | ICD-10-CM

## 2017-09-08 DIAGNOSIS — I10 Essential (primary) hypertension: Secondary | ICD-10-CM

## 2017-09-08 DIAGNOSIS — G4733 Obstructive sleep apnea (adult) (pediatric): Secondary | ICD-10-CM

## 2017-09-08 DIAGNOSIS — N401 Enlarged prostate with lower urinary tract symptoms: Secondary | ICD-10-CM

## 2017-09-09 ENCOUNTER — Encounter: Payer: Self-pay | Admitting: Family Medicine

## 2017-09-09 DIAGNOSIS — E559 Vitamin D deficiency, unspecified: Secondary | ICD-10-CM | POA: Insufficient documentation

## 2017-09-09 DIAGNOSIS — R7309 Other abnormal glucose: Secondary | ICD-10-CM | POA: Insufficient documentation

## 2017-09-09 LAB — COMPLETE METABOLIC PANEL WITH GFR
AG RATIO: 1.2 (calc) (ref 1.0–2.5)
ALT: 17 U/L (ref 9–46)
AST: 15 U/L (ref 10–35)
Albumin: 3.8 g/dL (ref 3.6–5.1)
Alkaline phosphatase (APISO): 50 U/L (ref 40–115)
BUN: 19 mg/dL (ref 7–25)
CALCIUM: 8.7 mg/dL (ref 8.6–10.3)
CO2: 29 mmol/L (ref 20–32)
Chloride: 102 mmol/L (ref 98–110)
Creat: 1.08 mg/dL (ref 0.70–1.25)
GFR, EST AFRICAN AMERICAN: 82 mL/min/{1.73_m2} (ref >=60)
GFR, EST NON AFRICAN AMERICAN: 71 mL/min/{1.73_m2} (ref >=60)
Globulin: 3.1 g/dL (calc) (ref 1.9–3.7)
Glucose, Bld: 101 mg/dL — ABNORMAL HIGH (ref 65–99)
POTASSIUM: 4.1 mmol/L (ref 3.5–5.3)
Sodium: 139 mmol/L (ref 135–146)
TOTAL PROTEIN: 6.9 g/dL (ref 6.1–8.1)
Total Bilirubin: 0.6 mg/dL (ref 0.2–1.2)

## 2017-09-09 LAB — HEMOGLOBIN A1C
EAG (MMOL/L): 7.1 (calc)
Hgb A1c MFr Bld: 6.1 % of total Hgb — ABNORMAL HIGH (ref <5.7)
MEAN PLASMA GLUCOSE: 128 (calc)

## 2017-09-09 LAB — CBC WITH DIFFERENTIAL/PLATELET
BASOS PCT: 0.3 %
Basophils Absolute: 37 cells/uL (ref 0–200)
EOS ABS: 24 {cells}/uL (ref 15–500)
EOS PCT: 0.2 %
HCT: 45 % (ref 38.5–50.0)
HEMOGLOBIN: 15.1 g/dL (ref 13.2–17.1)
Lymphs Abs: 3648 cells/uL (ref 850–3900)
MCH: 30.2 pg (ref 27.0–33.0)
MCHC: 33.6 g/dL (ref 32.0–36.0)
MCV: 90 fL (ref 80.0–100.0)
MONOS PCT: 6.2 %
MPV: 9 fL (ref 7.5–12.5)
NEUTROS ABS: 7735 {cells}/uL (ref 1500–7800)
Neutrophils Relative %: 63.4 %
Platelets: 306 10*3/uL (ref 140–400)
RBC: 5 10*6/uL (ref 4.20–5.80)
RDW: 12.4 % (ref 11.0–15.0)
Total Lymphocyte: 29.9 %
WBC mixed population: 756 cells/uL (ref 200–950)
WBC: 12.2 10*3/uL — ABNORMAL HIGH (ref 3.8–10.8)

## 2017-09-09 LAB — LIPID PANEL
CHOL/HDL RATIO: 3.1 (calc) (ref <5.0)
Cholesterol: 157 mg/dL (ref <200)
HDL: 51 mg/dL (ref >40)
LDL Cholesterol (Calc): 92 mg/dL (calc)
NON-HDL CHOLESTEROL (CALC): 106 mg/dL (ref <130)
Triglycerides: 53 mg/dL (ref <150)

## 2017-09-09 LAB — T4, FREE: FREE T4: 1.1 ng/dL (ref 0.8–1.8)

## 2017-09-09 LAB — TSH: TSH: 0.94 m[IU]/L (ref 0.40–4.50)

## 2017-09-09 LAB — PSA, TOTAL WITH REFLEX TO PSA, FREE: PSA, Total: 0.3 ng/mL (ref <=4.0)

## 2017-09-09 LAB — VITAMIN D 25 HYDROXY (VIT D DEFICIENCY, FRACTURES): Vit D, 25-Hydroxy: 20 ng/mL — ABNORMAL LOW (ref 30–100)

## 2017-09-15 ENCOUNTER — Ambulatory Visit (INDEPENDENT_AMBULATORY_CARE_PROVIDER_SITE_OTHER): Payer: Medicare Other

## 2017-09-15 VITALS — BP 124/82 | HR 60 | Temp 97.9°F | Resp 17 | Ht 69.0 in | Wt 227.4 lb

## 2017-09-15 DIAGNOSIS — Z0000 Encounter for general adult medical examination without abnormal findings: Secondary | ICD-10-CM | POA: Diagnosis not present

## 2017-09-15 NOTE — Patient Instructions (Addendum)
Curtis Gregory , Thank you for taking time to come for your Medicare Wellness Visit. I appreciate your ongoing commitment to your health goals. Please review the following plan we discussed and let me know if I can assist you in the future.   Screening recommendations/referrals: Colonoscopy: completed 09/11/2016 Recommended yearly ophthalmology/optometry visit for glaucoma screening and checkup Recommended yearly dental visit for hygiene and checkup  Vaccinations: Influenza vaccine: due 10/2017 Pneumococcal vaccine: pneumovax 23 due 10/07/2017 Tdap vaccine: up to date  Shingles vaccine: shingrix eligible, check with your insurance company for coverage     Advanced directives: Please bring a copy of your health care power of attorney and living will to the office at your convenience.  Conditions/risks identified: Recommend drinking at least 6-8 glasses of water a day   Next appointment: Follow up on 09/17/2017 at 9:20am with Dr.Karamalegos. Follow up in one year for your annual wellness exam.   Preventive Care 65 Years and Older, Male Preventive care refers to lifestyle choices and visits with your health care provider that can promote health and wellness. What does preventive care include?  A yearly physical exam. This is also called an annual well check.  Dental exams once or twice a year.  Routine eye exams. Ask your health care provider how often you should have your eyes checked.  Personal lifestyle choices, including:  Daily care of your teeth and gums.  Regular physical activity.  Eating a healthy diet.  Avoiding tobacco and drug use.  Limiting alcohol use.  Practicing safe sex.  Taking low doses of aspirin every day.  Taking vitamin and mineral supplements as recommended by your health care provider. What happens during an annual well check? The services and screenings done by your health care provider during your annual well check will depend on your age, overall  health, lifestyle risk factors, and family history of disease. Counseling  Your health care provider may ask you questions about your:  Alcohol use.  Tobacco use.  Drug use.  Emotional well-being.  Home and relationship well-being.  Sexual activity.  Eating habits.  History of falls.  Memory and ability to understand (cognition).  Work and work Statistician. Screening  You may have the following tests or measurements:  Height, weight, and BMI.  Blood pressure.  Lipid and cholesterol levels. These may be checked every 5 years, or more frequently if you are over 71 years old.  Skin check.  Lung cancer screening. You may have this screening every year starting at age 57 if you have a 30-pack-year history of smoking and currently smoke or have quit within the past 15 years.  Fecal occult blood test (FOBT) of the stool. You may have this test every year starting at age 22.  Flexible sigmoidoscopy or colonoscopy. You may have a sigmoidoscopy every 5 years or a colonoscopy every 10 years starting at age 41.  Prostate cancer screening. Recommendations will vary depending on your family history and other risks.  Hepatitis C blood test.  Hepatitis B blood test.  Sexually transmitted disease (STD) testing.  Diabetes screening. This is done by checking your blood sugar (glucose) after you have not eaten for a while (fasting). You may have this done every 1-3 years.  Abdominal aortic aneurysm (AAA) screening. You may need this if you are a current or former smoker.  Osteoporosis. You may be screened starting at age 32 if you are at high risk. Talk with your health care provider about your test results, treatment options,  and if necessary, the need for more tests. Vaccines  Your health care provider may recommend certain vaccines, such as:  Influenza vaccine. This is recommended every year.  Tetanus, diphtheria, and acellular pertussis (Tdap, Td) vaccine. You may need a Td  booster every 10 years.  Zoster vaccine. You may need this after age 66.  Pneumococcal 13-valent conjugate (PCV13) vaccine. One dose is recommended after age 55.  Pneumococcal polysaccharide (PPSV23) vaccine. One dose is recommended after age 13. Talk to your health care provider about which screenings and vaccines you need and how often you need them. This information is not intended to replace advice given to you by your health care provider. Make sure you discuss any questions you have with your health care provider. Document Released: 01/26/2015 Document Revised: 09/19/2015 Document Reviewed: 10/31/2014 Elsevier Interactive Patient Education  2017 [place] Prevention in the Home Falls can cause injuries. They can happen to people of all ages. There are many things you can do to make your home safe and to help prevent falls. What can I do on the outside of my home?  Regularly fix the edges of walkways and driveways and fix any cracks.  Remove anything that might make you trip as you walk through a door, such as a raised step or threshold.  Trim any bushes or trees on the path to your home.  Use bright outdoor lighting.  Clear any walking paths of anything that might make someone trip, such as rocks or tools.  Regularly check to see if handrails are loose or broken. Make sure that both sides of any steps have handrails.  Any raised decks and porches should have guardrails on the edges.  Have any leaves, snow, or ice cleared regularly.  Use sand or salt on walking paths during winter.  Clean up any spills in your garage right away. This includes oil or grease spills. What can I do in the bathroom?  Use night lights.  Install grab bars by the toilet and in the tub and shower. Do not use towel bars as grab bars.  Use non-skid mats or decals in the tub or shower.  If you need to sit down in the shower, use a plastic, non-slip stool.  Keep the floor dry. Clean up  any water that spills on the floor as soon as it happens.  Remove soap buildup in the tub or shower regularly.  Attach bath mats securely with double-sided non-slip rug tape.  Do not have throw rugs and other things on the floor that can make you trip. What can I do in the bedroom?  Use night lights.  Make sure that you have a light by your bed that is easy to reach.  Do not use any sheets or blankets that are too big for your bed. They should not hang down onto the floor.  Have a firm chair that has side arms. You can use this for support while you get dressed.  Do not have throw rugs and other things on the floor that can make you trip. What can I do in the kitchen?  Clean up any spills right away.  Avoid walking on wet floors.  Keep items that you use a lot in easy-to-reach places.  If you need to reach something above you, use a strong step stool that has a grab bar.  Keep electrical cords out of the way.  Do not use floor polish or wax that makes floors slippery. If  you must use wax, use non-skid floor wax.  Do not have throw rugs and other things on the floor that can make you trip. What can I do with my stairs?  Do not leave any items on the stairs.  Make sure that there are handrails on both sides of the stairs and use them. Fix handrails that are broken or loose. Make sure that handrails are as long as the stairways.  Check any carpeting to make sure that it is firmly attached to the stairs. Fix any carpet that is loose or worn.  Avoid having throw rugs at the top or bottom of the stairs. If you do have throw rugs, attach them to the floor with carpet tape.  Make sure that you have a light switch at the top of the stairs and the bottom of the stairs. If you do not have them, ask someone to add them for you. What else can I do to help prevent falls?  Wear shoes that:  Do not have high heels.  Have rubber bottoms.  Are comfortable and fit you well.  Are  closed at the toe. Do not wear sandals.  If you use a stepladder:  Make sure that it is fully opened. Do not climb a closed stepladder.  Make sure that both sides of the stepladder are locked into place.  Ask someone to hold it for you, if possible.  Clearly mark and make sure that you can see:  Any grab bars or handrails.  First and last steps.  Where the edge of each step is.  Use tools that help you move around (mobility aids) if they are needed. These include:  Canes.  Walkers.  Scooters.  Crutches.  Turn on the lights when you go into a dark area. Replace any light bulbs as soon as they burn out.  Set up your furniture so you have a clear path. Avoid moving your furniture around.  If any of your floors are uneven, fix them.  If there are any pets around you, be aware of where they are.  Review your medicines with your doctor. Some medicines can make you feel dizzy. This can increase your chance of falling. Ask your doctor what other things that you can do to help prevent falls. This information is not intended to replace advice given to you by your health care provider. Make sure you discuss any questions you have with your health care provider. Document Released: 10/26/2008 Document Revised: 06/07/2015 Document Reviewed: 02/03/2014 Elsevier Interactive Patient Education  2017 [employer].

## 2017-09-15 NOTE — Progress Notes (Signed)
Subjective:   Curtis Gregory is a 67 y.o. male who presents for an Initial Medicare Annual Wellness Visit.  Review of Systems   Cardiac Risk Factors include: hypertension;male gender;dyslipidemia;obesity (BMI >30kg/m2)    Objective:    Today's Vitals   09/15/17 0918  BP: 124/82  Pulse: 60  Resp: 17  Temp: 97.9 F (36.6 C)  TempSrc: Oral  Weight: 227 lb 6.4 oz (103.1 kg)  Height: 5\' 9"  (1.753 m)   Body mass index is 33.58 kg/m.  Advanced Directives 09/15/2017 09/11/2016 05/27/2014  Does Patient Have a Medical Advance Directive? Yes Yes No  Type of Advance Directive Living will;Healthcare Power of [place] -  Does patient want to make changes to medical advance directive? - No - Patient declined -  Copy of [place] in Chart? No - copy requested Yes -    Current Medications (verified) Outpatient Encounter Medications as of 09/15/2017  Medication Sig  . albuterol (PROVENTIL HFA;VENTOLIN HFA) 108 (90 BASE) MCG/ACT inhaler Inhale into the lungs. Reported on 06/04/2015  . fexofenadine (ALLEGRA ALLERGY) 180 MG tablet Take 1 tablet (180 mg total) by mouth daily.  . finasteride (PROSCAR) 5 MG tablet Take 1 tablet (5 mg total) by mouth daily.  . fluticasone (FLONASE) 50 MCG/ACT nasal spray Place 2 sprays into both nostrils daily. Use for 4-6 weeks then stop and use seasonally or as needed.  . hydrochlorothiazide (HYDRODIURIL) 25 MG tablet Start taking half tablet (dose 12.5mg ) daily, if not controlled can increase to 1 whole tablet daily (25mg )  . ipratropium (ATROVENT) 0.06 % nasal spray USE 2 SPRAYS IN EACH NOSTRIL FOUR TIMES DAILY FOR UP TO 5 TO 7 DAYS THEN STOP  . latanoprost (XALATAN) 0.005 % ophthalmic solution INSTILL ONE DROP IN BOTH EYES QHS.  [name] Kitchen omeprazole (PRILOSEC) 20 MG capsule TAKE 1 CAPSULE(20 MG) BY MOUTH DAILY  . OVER THE COUNTER MEDICATION Prime test testosterone booster  . rosuvastatin (CRESTOR) 20 MG tablet TAKE 1  TABLET(20 MG) BY MOUTH AT BEDTIME  . tamsulosin (FLOMAX) 0.4 MG CAPS capsule TAKE 2 CAPSULES BY MOUTH EVERY MORNING AT THE SAME TIME EACH DAY.  [name] Kitchen timolol (TIMOPTIC-XR) 0.5 % ophthalmic gel-forming INT 1 GTT INTO OS QD  . amoxicillin (AMOXIL) 500 MG capsule Take 1 capsule (500 mg total) by mouth 2 (two) times daily. For 7 days (Patient not taking: Reported on 09/15/2017)  . brompheniramine-pseudoephedrine-DM 30-2-10 MG/5ML syrup Take 5 mLs by mouth 4 (four) times daily as needed. (Patient not taking: Reported on 09/15/2017)  . hydrocortisone (ANUSOL-HC) 25 MG suppository Place 1 suppository (25 mg total) rectally 2 (two) times daily. For 7 days (Patient not taking: Reported on 09/15/2017)  . Multiple Vitamin (MULTIVITAMIN) tablet Take 1 tablet by mouth daily.  . predniSONE (DELTASONE) 50 MG tablet Take 1 tablet (50 mg total) by mouth daily with breakfast. (Patient not taking: Reported on 09/15/2017)   No facility-administered encounter medications on file as of 09/15/2017.     Allergies (verified) Patient has no known allergies.   History: Past Medical History:  Diagnosis Date  . Asthma   . Benign prostatic hypertrophy with urinary frequency 06/16/2014  . Essential (primary) hypertension 06/16/2014  . Excessive urination at night 06/16/2014  . GERD (gastroesophageal reflux disease)   . Glaucoma   . Hyperlipemia   . Seizures ([place])    as child  . Sleep apnea    Past Surgical History:  Procedure Laterality Date  . COLONOSCOPY WITH PROPOFOL N/A  09/11/2016   Procedure: COLONOSCOPY WITH PROPOFOL;  Surgeon: Lucilla Lame, MD;  Location: [facility];  Service: Gastroenterology;  Laterality: N/A;  sleep apnea  . none    . POLYPECTOMY N/A 09/11/2016   Procedure: POLYPECTOMY;  Surgeon: Lucilla Lame, MD;  Location: [facility];  Service: Gastroenterology;  Laterality: N/A;   Family History  Problem Relation Age of Onset  . Diabetes Mother   . Heart disease Mother   . Alzheimer's disease  Father   . Prostate cancer Father   . Kidney cancer Neg Hx    Social History   Socioeconomic History  . Marital status: Married    Spouse name: Not on file  . Number of children: Not on file  . Years of education: Not on file  . Highest education level: Master's degree (e.g., MA, MS, MEng, MEd, MSW, MBA)  Occupational History  . Not on file  Social Needs  . Financial resource strain: Not hard at all  . Food insecurity:    Worry: Never true    Inability: Never true  . Transportation needs:    Medical: No    Non-medical: No  Tobacco Use  . Smoking status: Never Smoker  . Smokeless tobacco: Never Used  Substance and Sexual Activity  . Alcohol use: Yes    Alcohol/week: 1.0 standard drinks    Types: 1 Glasses of wine per week    Comment: occasional  . Drug use: No  . Sexual activity: Not Currently  Lifestyle  . Physical activity:    Days per week: 3 days    Minutes per session: 60 min  . Stress: Not at all  Relationships  . Social connections:    Talks on phone: More than three times a week    Gets together: More than three times a week    Attends religious service: More than 4 times per year    Active member of club or organization: No    Attends meetings of clubs or organizations: Never    Relationship status: Married  Other Topics Concern  . Not on file  Social History Narrative  . Not on file   Tobacco Counseling Counseling given: Not Answered   Clinical Intake:  Pre-visit preparation completed: Yes  Pain : No/denies pain     Nutritional Status: BMI > 30  Obese Nutritional Risks: None Diabetes: No  How often do you need to have someone help you when you read instructions, pamphlets, or other written materials from your doctor or pharmacy?: 1 - Never What is the last grade level you completed in school?: masters   Interpreter Needed?: No  Information entered by :: Jeffry Vogelsang,LPN  Activities of Daily Living In your present state of health, do  you have any difficulty performing the following activities: 09/15/2017  Hearing? N  Vision? N  Difficulty concentrating or making decisions? Y  Walking or climbing stairs? N  Dressing or bathing? N  Doing errands, shopping? N  Preparing Food and eating ? N  Using the Toilet? N  In the past six months, have you accidently leaked urine? N  Do you have problems with loss of bowel control? N  Managing your Medications? N  Managing your Finances? N  Housekeeping or managing your Housekeeping? N  Some recent data might be hidden     Immunizations and Health Maintenance Immunization History  Administered Date(s) Administered  . Influenza, High Dose Seasonal PF 10/06/2016  . Influenza,inj,Quad PF,6+ Mos 10/21/2013, 10/18/2014, 11/08/2015  .  Pneumococcal Conjugate-13 10/06/2016  . Tdap 10/28/2014   Health Maintenance Due  Topic Date Due  . INFLUENZA VACCINE  08/13/2017    Patient Care Team: Olin Hauser, DO as PCP - General (Family Medicine) Nickie Retort, MD as Consulting Physician (Urology)  Indicate any recent Medical Services you may have received from other than [facility] providers in the past year (date may be approximate).    Assessment:   This is a routine wellness examination for Trevino.  Hearing/Vision screen Vision Screening Comments: Goes to [place] annually  Dietary issues and exercise activities discussed: Current Exercise Habits: Home exercise routine, Type of exercise: strength training/weights;walking, Frequency (Times/Week): 3, Intensity: Mild, Exercise limited by: None identified  Goals    . DIET - INCREASE WATER INTAKE     Recommend drinking at least 6-8 glasses of water a day       Depression Screen PHQ 2/9 Scores 09/15/2017 09/01/2017 08/25/2017 06/03/2017  PHQ - 2 Score 0 0 0 0    Fall Risk Fall Risk  09/15/2017 09/01/2017 08/25/2017 06/03/2017 10/06/2016  Falls in the past year? No No No No No    Is the patient's home free of loose throw  rugs in walkways, pet beds, electrical cords, etc?   yes      Grab bars in the bathroom? no      Handrails on the stairs?   no stairs       Adequate lighting?   yes  Timed Get Up and Go performed: Completed in 8 seconds with no use of assistive devices, steady gait. No intervention needed at this time.   Cognitive Function:     6CIT Screen 09/15/2017  What Year? 0 points  What month? 0 points  What time? 0 points  Count back from 20 0 points  Months in reverse 0 points  Repeat phrase 2 points  Total Score 2    Screening Tests Health Maintenance  Topic Date Due  . INFLUENZA VACCINE  08/13/2017  . PNA vac Low Risk Adult (2 of 2 - PPSV23) 10/06/2017  . COLONOSCOPY  09/11/2021  . TETANUS/TDAP  10/17/2024  . Hepatitis C Screening  Completed    Qualifies for Shingles Vaccine? Yes, discussed shingrix vaccine   Cancer Screenings: Lung: Low Dose CT Chest recommended if Age 59-80 years, 30 pack-year currently smoking OR have quit w/in 15years. Patient does not qualify. Colorectal: completed 09/11/2016  Additional Screenings:  Hepatitis C Screening: 05/22/2016      Plan:    I have personally reviewed and addressed the Medicare Annual Wellness questionnaire and have noted the following in the patient's chart:  A. Medical and social history B. Use of alcohol, tobacco or illicit drugs  C. Current medications and supplements D. Functional ability and status E.  Nutritional status F.  Physical activity G. Advance directives H. List of other physicians I.  Hospitalizations, surgeries, and ER visits in previous 12 months J.  [place] such as hearing and vision if needed, cognitive and depression L. Referrals and appointments   In addition, I have reviewed and discussed with patient certain preventive protocols, quality metrics, and best practice recommendations. A written personalized care plan for preventive services as well as general preventive health recommendations  were provided to patient.   Signed,  Tyler Aas, LPN Nurse Health Advisor   Nurse Notes:none

## 2017-09-16 ENCOUNTER — Encounter: Payer: Self-pay | Admitting: Urology

## 2017-09-16 ENCOUNTER — Ambulatory Visit: Payer: Medicare Other | Admitting: Urology

## 2017-09-17 ENCOUNTER — Encounter: Payer: Medicare Other | Admitting: Family Medicine

## 2017-09-18 ENCOUNTER — Encounter: Payer: Self-pay | Admitting: Family Medicine

## 2017-09-18 ENCOUNTER — Ambulatory Visit (INDEPENDENT_AMBULATORY_CARE_PROVIDER_SITE_OTHER): Payer: Medicare Other | Admitting: Family Medicine

## 2017-09-18 VITALS — BP 111/60 | HR 63 | Temp 98.5°F | Resp 16 | Ht 68.5 in | Wt 229.6 lb

## 2017-09-18 DIAGNOSIS — E7849 Other hyperlipidemia: Secondary | ICD-10-CM | POA: Diagnosis not present

## 2017-09-18 DIAGNOSIS — E559 Vitamin D deficiency, unspecified: Secondary | ICD-10-CM | POA: Diagnosis not present

## 2017-09-18 DIAGNOSIS — N401 Enlarged prostate with lower urinary tract symptoms: Secondary | ICD-10-CM | POA: Diagnosis not present

## 2017-09-18 DIAGNOSIS — E782 Mixed hyperlipidemia: Secondary | ICD-10-CM | POA: Diagnosis not present

## 2017-09-18 DIAGNOSIS — R7309 Other abnormal glucose: Secondary | ICD-10-CM

## 2017-09-18 DIAGNOSIS — J302 Other seasonal allergic rhinitis: Secondary | ICD-10-CM

## 2017-09-18 DIAGNOSIS — R350 Frequency of micturition: Secondary | ICD-10-CM

## 2017-09-18 DIAGNOSIS — K219 Gastro-esophageal reflux disease without esophagitis: Secondary | ICD-10-CM

## 2017-09-18 DIAGNOSIS — I10 Essential (primary) hypertension: Secondary | ICD-10-CM

## 2017-09-18 MED ORDER — OMEPRAZOLE 20 MG PO CPDR: 20.0000 mg | DELAYED_RELEASE_CAPSULE | Freq: Every day | ORAL | 3 refills | Status: DC

## 2017-09-18 MED ORDER — TAMSULOSIN HCL 0.4 MG PO CAPS: ORAL_CAPSULE | ORAL | 0 refills | Status: DC

## 2017-09-18 MED ORDER — ROSUVASTATIN CALCIUM 20 MG PO TABS: 20.0000 mg | ORAL_TABLET | Freq: Every day | ORAL | 3 refills | Status: DC

## 2017-09-18 MED ORDER — FEXOFENADINE HCL 180 MG PO TABS: 180.0000 mg | ORAL_TABLET | Freq: Every day | ORAL | 3 refills | Status: DC

## 2017-09-18 MED ORDER — HYDROCHLOROTHIAZIDE 12.5 MG PO TABS: 12.5000 mg | ORAL_TABLET | Freq: Every day | ORAL | 3 refills | Status: DC

## 2017-09-18 NOTE — Assessment & Plan Note (Signed)
Stable Chronic problem Refill generic Allegra 180mg  daily - printed and goodrx coupon

## 2017-09-18 NOTE — Patient Instructions (Addendum)
Thank you for coming to the office today.  Please schedule and return for a NURSE ONLY VISIT for VACCINE - Approximately around October 2019 - Need High Dose Flu Vaccine  Elevated A1c as discussed, 6.1, this means at risk of Pre-Diabetes.  Try to reduce starches and carbs as we reviewed.  Re-check in 6 months.  Also low Vitamin D  - Start OTC Vitamin D3 5,000 iu daily for 12 weeks then reduce to OTC Vitamin D3 2,000 iu daily for maintenance  Chemistry - Normal results, including electrolytes, kidney and liver function. - Normal fasting blood sugar   PSA Prostate Cancer Screening - 0.3, negative.  TSH Thyroid Function Tests - Normal. This does not seem to be a factor for any of your symptoms of reduced energy.  CBC Blood Counts - Mildly elevated WBC count can be from sinus or allergy or other infection, otherwise results are normal, no anemia, no other significant abnormality  Cholesterol - Normal cholesterol results. Controlled on Rosuvastatin 20mg  medication.  Refilled meds for 90 day up to 1 year  Added a 1 month temporary refill Tamsulosin for prostate - then can re-schedule with Urology - printed their letter, was a missed apt on 9/4 - check back with them.  Printed Lexicographer for allergies.  Keep track of BP - reduced dose from 25 down to 12.5mg   Please schedule a Follow-up Appointment to: Return in about 6 months (around 03/19/2018) for PreDM A1c, HTN.  If you have any other questions or concerns, please feel free to call the office or send a message through [facility]. You may also schedule an earlier appointment if necessary.  Additionally, you may be receiving a survey about your experience at our office within a few days to 1 week by e-mail or mail. We value your feedback.  [name], DO [place]

## 2017-09-18 NOTE — Assessment & Plan Note (Signed)
Followed by [facility] Controlled BPH LUTS On Flomax 0.8mg  daily and Finasteride 5mg   Today agree to refill for 30 day supply on Flomax since he is out and recently missed apt for his yearly with Urology, he states was unaware of the apt, printed the letter from Urology and gave him to call back to re-schedule.

## 2017-09-18 NOTE — Assessment & Plan Note (Signed)
Stable, controlled on PPI Refill Omeprazole 20mg daily 

## 2017-09-18 NOTE — Assessment & Plan Note (Signed)
Elevated A1c to 6.1 from prior 5.6. Concern new dx PreDM Fam history of DM Concern with obesity, HTN, HLD  Plan:  1. Not on any therapy currently  2. Encourage improved lifestyle - low carb, low sugar diet, reduce portion size, continue improving  regular exercise - handout given with glycemic index and low carb options 3. Follow-up 6 months POC A1c trend

## 2017-09-18 NOTE — Assessment & Plan Note (Signed)
Controlled cholesterol on statin and improving lifestyle Last lipid panel 08/2017  Plan: 1. Continue current meds - Rosuvastatin 20mg  daily 2. Encourage improved lifestyle - low carb/cholesterol, reduce portion size, continue improving regular exercise - Yearly lipids

## 2017-09-18 NOTE — Assessment & Plan Note (Addendum)
Well-controlled HTN - Home BP readings normal  Complication OSA    Plan:  1. Continue HCTZ 12.5mg  daily - sent NEW RX for smaller dose pill - now just one whole 12.5mg  pill daily - instead of HALF of the 25mg  tablet 2. Encourage improved lifestyle - low sodium diet, regular exercise 3. Continue monitor BP outside office, bring readings to next visit, if persistently >140/90 or new symptoms notify office sooner 4. Follow-up q 6 mo

## 2017-09-18 NOTE — Progress Notes (Signed)
Subjective:    Patient ID: Curtis Gregory, male    DOB: 27-Jul-1950, 67 y.o.   MRN: [id number]  Curtis Gregory is a 67 y.o. male presenting on 09/18/2017 for Hypertension   HPI   Elevated A1c / Obesity BMI >34 Reports concern with recent lab A1c 6.1, up from prior 5.6. Fam history of DM. He has not had dx PreDM before CBGs: None Meds: Never on med. Currently not on ACEi / ARB Lifestyle: - Weight relatively stable in past 1-2 months, overall some gradual wt gain - Diet (admits eating too many carbs, bread, starch rice, not following low carb diet plan currently)  - Exercise (limited regular exercise, can improve)  CHRONIC HTN: Reports question about dosing of med, was taking half pill HCTZ 25 for long time and was not sure if he should be on whole pill, last visit [facility] was advised may need to be on full pill Current Meds - HCTZ 12.5mg  (HALF OF 25mg  tab) daily - TODAY HE TOOK 1 WHOLE PILL = 25mg   Reports good compliance, took meds today. Tolerating well, w/o complaints. Denies edema or lightheaded or dizzy on this med - when he has tried full dose 1 tablet  HYPERLIPIDEMIA: - Reports no concerns. Last lipid panel 08/2017, controlled  - Currently taking Rosuvastatin 20mg , tolerating well without side effects or myalgias Needs refill  GERD, Chronic History of chronic GERD with reflux symptoms has been on Omeprazole 20mg  daily for at least 5 years Today he reports no concerns. Needs refill - Denies ever having GI follow or having EGD  Vitamin D Deficiency Lab result 20. Prior symptoms of fatigue and tiredness. Not on supplement.  BPH LUTS Followed by [facility], last seen by Dr Pilar Jarvis in 08/2016. He is managed on Tamsulosin 0.4mg  x 2 tabs for 0.8mg  daily and Finasteride 5mg  daily with good results overall, still has some residual LUTS but well controlled overall. - He is nearly out of med, got temporary rx from pharmacy. He has called [facility] but has not heard back. He was  scheduled with apt on 09/16/17 but states he was not notified and did not receive letter or apt. He missed that appointment it seems.  Seasonal / Environmental Allergies Currently stable. No recent flare. Improved on Allegra. Just ran out, needs new rx to see if it can be covered.  Additionally - no more bleeding or problems after hemorrhoids treated by Dr Burt Knack earlier this year.   Health Maintenance:  Due Flu shot - will return for high dose vaccine when in stock  Prostate CA Screening: Last visit Urology DRE +2 benign. Last PSA 0.3 (08/2017). Currently mostly controlled BPH LUTS. Known family history of prostate CA father and brothers x 2.    Depression screen [facility] 2/9 09/18/2017 09/15/2017 09/01/2017  Decreased Interest 0 0 0  Down, Depressed, Hopeless 0 0 0  PHQ - 2 Score 0 0 0    Past Medical History:  Diagnosis Date  . Asthma   . Benign prostatic hypertrophy with urinary frequency 06/16/2014  . Excessive urination at night 06/16/2014  . GERD (gastroesophageal reflux disease)   . Glaucoma   . Hyperlipemia   . Sleep apnea    Past Surgical History:  Procedure Laterality Date  . COLONOSCOPY WITH PROPOFOL N/A 09/11/2016   Procedure: COLONOSCOPY WITH PROPOFOL;  Surgeon: Lucilla Lame, MD;  Location: [place];  Service: Gastroenterology;  Laterality: N/A;  sleep apnea  . none    . POLYPECTOMY N/A 09/11/2016  Procedure: POLYPECTOMY;  Surgeon: Lucilla Lame, MD;  Location: [place];  Service: Gastroenterology;  Laterality: N/A;   Social History   Socioeconomic History  . Marital status: Married    Spouse name: Not on file  . Number of children: Not on file  . Years of education: Not on file  . Highest education level: Master's degree (e.g., MA, MS, MEng, MEd, MSW, MBA)  Occupational History  . Not on file  Social Needs  . Financial resource strain: Not hard at all  . Food insecurity:    Worry: Never true    Inability: Never true  . Transportation needs:     Medical: No    Non-medical: No  Tobacco Use  . Smoking status: Never Smoker  . Smokeless tobacco: Never Used  Substance and Sexual Activity  . Alcohol use: Yes    Alcohol/week: 1.0 standard drinks    Types: 1 Glasses of wine per week    Comment: occasional  . Drug use: No  . Sexual activity: Not Currently  Lifestyle  . Physical activity:    Days per week: 3 days    Minutes per session: 60 min  . Stress: Not at all  Relationships  . Social connections:    Talks on phone: More than three times a week    Gets together: More than three times a week    Attends religious service: More than 4 times per year    Active member of club or organization: No    Attends meetings of clubs or organizations: Never    Relationship status: Married  . Intimate partner violence:    Fear of current or ex partner: No    Emotionally abused: No    Physically abused: No    Forced sexual activity: No  Other Topics Concern  . Not on file  Social History Narrative  . Not on file   Family History  Problem Relation Age of Onset  . Diabetes Mother   . Heart disease Mother   . Alzheimer's disease Father   . Prostate cancer Father   . Prostate cancer Brother   . Kidney cancer Neg Hx    Current Outpatient Medications on File Prior to Visit  Medication Sig  . albuterol (PROVENTIL HFA;VENTOLIN HFA) 108 (90 BASE) MCG/ACT inhaler Inhale into the lungs. Reported on 06/04/2015  . finasteride (PROSCAR) 5 MG tablet Take 1 tablet (5 mg total) by mouth daily.  . fluticasone (FLONASE) 50 MCG/ACT nasal spray Place 2 sprays into both nostrils daily. Use for 4-6 weeks then stop and use seasonally or as needed.  . hydrocortisone (ANUSOL-HC) 25 MG suppository Place 1 suppository (25 mg total) rectally 2 (two) times daily. For 7 days  . latanoprost (XALATAN) 0.005 % ophthalmic solution INSTILL ONE DROP IN BOTH EYES QHS.  . Multiple Vitamin (MULTIVITAMIN) tablet Take 1 tablet by mouth daily.  [name] Kitchen OVER THE COUNTER  MEDICATION Prime test testosterone booster  . timolol (TIMOPTIC-XR) 0.5 % ophthalmic gel-forming INT 1 GTT INTO OS QD   No current facility-administered medications on file prior to visit.     Review of Systems  Constitutional: Negative for activity change, appetite change, chills, diaphoresis, fatigue and fever.  HENT: Negative for congestion and hearing loss.   Eyes: Negative for visual disturbance.  Respiratory: Negative for apnea, cough, chest tightness, shortness of breath and wheezing.   Cardiovascular: Negative for chest pain, palpitations and leg swelling.  Gastrointestinal: Negative for abdominal pain, anal bleeding, blood in stool, constipation,  diarrhea, nausea and vomiting.  Endocrine: Negative for cold intolerance.  Genitourinary: Negative for decreased urine volume, difficulty urinating, dysuria, frequency and hematuria.       Still some residual LUTS - frequency, nocturia  Musculoskeletal: Negative for arthralgias, back pain and neck pain.  Skin: Negative for rash.  Allergic/Immunologic: Positive for environmental allergies.  Neurological: Negative for dizziness, weakness, light-headedness, numbness and headaches.  Hematological: Negative for adenopathy.  Psychiatric/Behavioral: Negative for behavioral problems, dysphoric mood and sleep disturbance. The patient is not nervous/anxious.    Per HPI unless specifically indicated above     Objective:    BP 111/60   Pulse 63   Temp 98.5 F (36.9 C) (Oral)   Resp 16   Ht 5' 8.5" (1.74 m)   Wt 229 lb 9.6 oz (104.1 kg)   BMI 34.40 kg/m   Wt Readings from Last 3 Encounters:  09/18/17 229 lb 9.6 oz (104.1 kg)  09/15/17 227 lb 6.4 oz (103.1 kg)  09/01/17 228 lb 9.6 oz (103.7 kg)    Physical Exam  Constitutional: He is oriented to person, place, and time. He appears well-developed and well-nourished. No distress.  Well-appearing, comfortable, cooperative  HENT:  Head: Normocephalic and atraumatic.  Mouth/Throat:  Oropharynx is clear and moist.  Frontal / maxillary sinuses non-tender. Nares patent without purulence or edema. Bilateral TMs clear without erythema, effusion or bulging. Oropharynx clear without erythema, exudates, edema or asymmetry.  Eyes: Pupils are equal, round, and reactive to light. Conjunctivae and EOM are normal. Right eye exhibits no discharge. Left eye exhibits no discharge.  Neck: Normal range of motion. Neck supple. No thyromegaly present.  Cardiovascular: Normal rate, regular rhythm, normal heart sounds and intact distal pulses.  No murmur heard. Pulmonary/Chest: Effort normal and breath sounds normal. No respiratory distress. He has no wheezes. He has no rales.  Abdominal: Soft. Bowel sounds are normal. He exhibits no distension and no mass. There is no tenderness.  Musculoskeletal: Normal range of motion. He exhibits no edema or tenderness.  Upper / Lower Extremities: - Normal muscle tone, strength bilateral upper extremities 5/5, lower extremities 5/5  Lymphadenopathy:    He has no cervical adenopathy.  Neurological: He is alert and oriented to person, place, and time.  Distal sensation intact to light touch all extremities  Skin: Skin is warm and dry. No rash noted. He is not diaphoretic. No erythema.  Psychiatric: He has a normal mood and affect. His behavior is normal.  Well groomed, good eye contact, normal speech and thoughts  Nursing note and vitals reviewed.  Recent Labs    09/08/17 0824  HGBA1C 6.1*    Results for orders placed or performed in visit on 09/08/17  VITAMIN D 25 Hydroxy (Vit-D Deficiency, Fractures)  Result Value Ref Range   Vit D, 25-Hydroxy 20 (L) 30 - 100 ng/mL  T4, free  Result Value Ref Range   Free T4 1.1 0.8 - 1.8 ng/dL  TSH  Result Value Ref Range   TSH 0.94 0.40 - 4.50 mIU/L  PSA, Total with Reflex to PSA, Free  Result Value Ref Range   PSA, Total 0.3 < OR = 4.0 ng/mL  Lipid panel  Result Value Ref Range   Cholesterol 157 <200  mg/dL   HDL 51 >40 mg/dL   Triglycerides 53 <150 mg/dL   LDL Cholesterol (Calc) 92 mg/dL (calc)   Total CHOL/HDL Ratio 3.1 <5.0 (calc)   Non-HDL Cholesterol (Calc) 106 <130 mg/dL (calc)  COMPLETE METABOLIC PANEL WITH GFR  Result Value Ref Range   Glucose, Bld 101 (H) 65 - 99 mg/dL   BUN 19 7 - 25 mg/dL   Creat 1.08 0.70 - 1.25 mg/dL   GFR, Est Non African American 71 > OR = 60 mL/min/1.65m2   GFR, Est African American 82 > OR = 60 mL/min/1.95m2   BUN/Creatinine Ratio NOT APPLICABLE 6 - 22 (calc)   Sodium 139 135 - 146 mmol/L   Potassium 4.1 3.5 - 5.3 mmol/L   Chloride 102 98 - 110 mmol/L   CO2 29 20 - 32 mmol/L   Calcium 8.7 8.6 - 10.3 mg/dL   Total Protein 6.9 6.1 - 8.1 g/dL   Albumin 3.8 3.6 - 5.1 g/dL   Globulin 3.1 1.9 - 3.7 g/dL (calc)   AG Ratio 1.2 1.0 - 2.5 (calc)   Total Bilirubin 0.6 0.2 - 1.2 mg/dL   Alkaline phosphatase (APISO) 50 40 - 115 U/L   AST 15 10 - 35 U/L   ALT 17 9 - 46 U/L  CBC with Differential/Platelet  Result Value Ref Range   WBC 12.2 (H) 3.8 - 10.8 Thousand/uL   RBC 5.00 4.20 - 5.80 Million/uL   Hemoglobin 15.1 13.2 - 17.1 g/dL   HCT 45.0 38.5 - 50.0 %   MCV 90.0 80.0 - 100.0 fL   MCH 30.2 27.0 - 33.0 pg   MCHC 33.6 32.0 - 36.0 g/dL   RDW 12.4 11.0 - 15.0 %   Platelets 306 140 - 400 Thousand/uL   MPV 9.0 7.5 - 12.5 fL   Neutro Abs 7,735 1,500 - 7,800 cells/uL   Lymphs Abs 3,648 850 - 3,900 cells/uL   WBC mixed population 756 200 - 950 cells/uL   Eosinophils Absolute 24 15 - 500 cells/uL   Basophils Absolute 37 0 - 200 cells/uL   Neutrophils Relative % 63.4 %   Total Lymphocyte 29.9 %   Monocytes Relative 6.2 %   Eosinophils Relative 0.2 %   Basophils Relative 0.3 %  Hemoglobin A1c  Result Value Ref Range   Hgb A1c MFr Bld 6.1 (H) <5.7 % of total Hgb   Mean Plasma Glucose 128 (calc)   eAG (mmol/L) 7.1 (calc)      Assessment & Plan:   Problem List Items Addressed This Visit    Benign prostatic hyperplasia with urinary frequency     Followed by [facility] Controlled BPH LUTS On Flomax 0.8mg  daily and Finasteride 5mg   Today agree to refill for 30 day supply on Flomax since he is out and recently missed apt for his yearly with Urology, he states was unaware of the apt, printed the letter from Urology and gave him to call back to re-schedule.      Relevant Medications   tamsulosin (FLOMAX) 0.4 MG CAPS capsule   Elevated hemoglobin A1c    Elevated A1c to 6.1 from prior 5.6. Concern new dx PreDM Fam history of DM Concern with obesity, HTN, HLD  Plan:  1. Not on any therapy currently  2. Encourage improved lifestyle - low carb, low sugar diet, reduce portion size, continue improving  regular exercise - handout given with glycemic index and low carb options 3. Follow-up 6 months POC A1c trend       Essential hypertension - Primary    Well-controlled HTN - Home BP readings normal  Complication OSA    Plan:  1. Continue HCTZ 12.5mg  daily - sent NEW RX for smaller dose pill - now just one whole 12.5mg  pill daily -  instead of HALF of the 25mg  tablet 2. Encourage improved lifestyle - low sodium diet, regular exercise 3. Continue monitor BP outside office, bring readings to next visit, if persistently >140/90 or new symptoms notify office sooner 4. Follow-up q 6 mo       Relevant Medications   hydrochlorothiazide (HYDRODIURIL) 12.5 MG tablet   rosuvastatin (CRESTOR) 20 MG tablet   GERD (gastroesophageal reflux disease)    Stable, controlled on PPI Refill Omeprazole 20mg  daily      Relevant Medications   omeprazole (PRILOSEC) 20 MG capsule   Hyperlipidemia    Controlled cholesterol on statin and improving lifestyle Last lipid panel 08/2017  Plan: 1. Continue current meds - Rosuvastatin 20mg  daily 2. Encourage improved lifestyle - low carb/cholesterol, reduce portion size, continue improving regular exercise - Yearly lipids      Relevant Medications   hydrochlorothiazide (HYDRODIURIL) 12.5 MG tablet    rosuvastatin (CRESTOR) 20 MG tablet   Seasonal allergies    Stable Chronic problem Refill generic Allegra 180mg  daily - printed and goodrx coupon      Relevant Medications   fexofenadine (ALLEGRA ALLERGY) 180 MG tablet   Vitamin D deficiency    Low Vitamin D 20 Suspected contributing to symptoms of some reduced energy Start OTC Vitamin D3 5,000 iu daily for 12 weeks then reduce to OTC Vitamin D3 2,000 iu daily for maintenance        Other Visit Diagnoses    Excessive urination at night          Meds ordered this encounter  Medications  . hydrochlorothiazide (HYDRODIURIL) 12.5 MG tablet    Sig: Take 1 tablet (12.5 mg total) by mouth daily.    Dispense:  90 tablet    Refill:  3  . rosuvastatin (CRESTOR) 20 MG tablet    Sig: Take 1 tablet (20 mg total) by mouth daily.    Dispense:  90 tablet    Refill:  3    Add refills  . omeprazole (PRILOSEC) 20 MG capsule    Sig: Take 1 capsule (20 mg total) by mouth daily before breakfast.    Dispense:  90 capsule    Refill:  3    Add refills  . tamsulosin (FLOMAX) 0.4 MG CAPS capsule    Sig: TAKE 2 CAPSULES BY MOUTH EVERY MORNING AT THE SAME TIME EACH DAY.    Dispense:  60 capsule    Refill:  0    Temporary 1 month until return to Urology  . fexofenadine (ALLEGRA ALLERGY) 180 MG tablet    Sig: Take 1 tablet (180 mg total) by mouth daily.    Dispense:  90 tablet    Refill:  3    Follow up plan: Return in about 6 months (around 03/19/2018) for PreDM A1c, HTN.  [name], DO [facility] 09/18/2017, 3:24 PM

## 2017-09-18 NOTE — Assessment & Plan Note (Signed)
Low Vitamin D 20 Suspected contributing to symptoms of some reduced energy Start OTC Vitamin D3 5,000 iu daily for 12 weeks then reduce to OTC Vitamin D3 2,000 iu daily for maintenance

## 2017-10-14 ENCOUNTER — Other Ambulatory Visit: Payer: Self-pay | Admitting: Family Medicine

## 2017-10-14 DIAGNOSIS — R350 Frequency of micturition: Principal | ICD-10-CM

## 2017-10-14 DIAGNOSIS — N401 Enlarged prostate with lower urinary tract symptoms: Secondary | ICD-10-CM

## 2017-10-30 ENCOUNTER — Ambulatory Visit: Payer: Medicare Other | Admitting: Urology

## 2017-11-10 ENCOUNTER — Ambulatory Visit (INDEPENDENT_AMBULATORY_CARE_PROVIDER_SITE_OTHER): Payer: Medicare Other

## 2017-11-10 DIAGNOSIS — Z23 Encounter for immunization: Secondary | ICD-10-CM

## 2017-11-15 ENCOUNTER — Other Ambulatory Visit: Payer: Self-pay | Admitting: Nurse Practitioner

## 2017-11-15 DIAGNOSIS — R350 Frequency of micturition: Principal | ICD-10-CM

## 2017-11-15 DIAGNOSIS — N401 Enlarged prostate with lower urinary tract symptoms: Secondary | ICD-10-CM

## 2017-11-18 ENCOUNTER — Ambulatory Visit: Payer: Medicare Other | Admitting: Urology

## 2017-11-18 ENCOUNTER — Encounter

## 2017-11-20 ENCOUNTER — Ambulatory Visit (INDEPENDENT_AMBULATORY_CARE_PROVIDER_SITE_OTHER): Payer: Medicare Other | Admitting: Family Medicine

## 2017-11-20 ENCOUNTER — Encounter: Payer: Self-pay | Admitting: Family Medicine

## 2017-11-20 VITALS — BP 129/84 | HR 66 | Temp 98.5°F | Resp 16 | Ht 68.5 in | Wt 225.4 lb

## 2017-11-20 DIAGNOSIS — M545 Low back pain, unspecified: Secondary | ICD-10-CM

## 2017-11-20 DIAGNOSIS — M25551 Pain in right hip: Secondary | ICD-10-CM

## 2017-11-20 MED ORDER — MELOXICAM 15 MG PO TABS: 15.0000 mg | ORAL_TABLET | Freq: Every day | ORAL | 0 refills | Status: DC

## 2017-11-20 MED ORDER — BACLOFEN 10 MG PO TABS: 5.0000 mg | ORAL_TABLET | Freq: Three times a day (TID) | ORAL | 0 refills | Status: DC | PRN

## 2017-11-20 MED ORDER — PREDNISONE 10 MG PO TABS: ORAL_TABLET | ORAL | 0 refills | Status: DC

## 2017-11-20 NOTE — Progress Notes (Signed)
Subjective:    Patient ID: Curtis Gregory, male    DOB: 1950/12/26, 67 y.o.   MRN: [id number]  Curtis Gregory is a 66 y.o. male presenting on 11/20/2017 for Back Pain (as per patient fell 2 weeks ago by pulling garbage cane twisted knee but knee doesn't hurt just lower back pain)   HPI   Right Low Back / Hip Pain Reports he fell accidentally 2 weeks ago, he was pulling garbage can in the rain, and he had a twist of R knee and fell on R side of hip and low back. No significant pain initially. Describes pain gradually onset over few days, seems mostly having constant ache but not having sharp pains, walking makes it worse sharp turn with inc pain, also with transition from sit to standing, if walking and active pain is less, about 4 out of 10. Pain worse in AM. Does not wake up from sleep due to pain. - Not taking any OTC medication for pain - has tried Flexeril and Meloxicam in the past 2016 tolerated well. - No known dx of arthritis in past, no imaging x-ray for back or hip. 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 - Denies numbness tingling weakness in the legs, new fall or trauma, swelling, redness erythema,   Health Maintenance: UTD Flu vaccine 11/10/17  Depression screen [facility] 2/9 11/20/2017 09/18/2017 09/15/2017  Decreased Interest 0 0 0  Down, Depressed, Hopeless 0 0 0  PHQ - 2 Score 0 0 0    Social History   Tobacco Use  . Smoking status: Never Smoker  . Smokeless tobacco: Never Used  Substance Use Topics  . Alcohol use: Yes    Alcohol/week: 1.0 standard drinks    Types: 1 Glasses of wine per week    Comment: occasional  . Drug use: No    Review of Systems Per HPI unless specifically indicated above     Objective:    BP 129/84   Pulse 66   Temp 98.5 F (36.9 C) (Oral)   Resp 16   Ht 5' 8.5" (1.74 m)   Wt 225 lb 5.8 oz (102.2 kg)   BMI 33.77 kg/m   Wt  Readings from Last 3 Encounters:  11/20/17 225 lb 5.8 oz (102.2 kg)  09/18/17 229 lb 9.6 oz (104.1 kg)  09/15/17 227 lb 6.4 oz (103.1 kg)    Physical Exam  Constitutional: He is oriented to person, place, and time. He appears well-developed and well-nourished. No distress.  Well-appearing, comfortable, cooperative  HENT:  Head: Normocephalic and atraumatic.  Mouth/Throat: Oropharynx is clear and moist.  Eyes: Conjunctivae are normal. Right eye exhibits no discharge. Left eye exhibits no discharge.  Cardiovascular: Normal rate.  Pulmonary/Chest: Effort normal.  Musculoskeletal: He exhibits no edema.  Low Back / R Hip Inspection: BACK - Normal appearance, no spinal deformity, symmetrical. HIP - Normal appearance, symmetrical, no obvious leg length or pelvis deformity  Palpation: BACK - No tenderness over spinous processes. Bilateral lumbar paraspinal muscles non-tender and without hypertonicity/spasm, but some muscle hypertonicity into R gluteal SI region. HIP - Non tender to palpation deeper R greater trochanter region of lateral upper thigh. Lower extremity thigh calf soft non tender no spasm.  ROM: BACK - Full active ROM forward flex / back extension, rotation L/R without discomfort HIP - Bilateral hip flex/ext supine normal -  limited internal rotation R.  Special Testing: BACK - Seated SLR negative for radicular pain bilaterally but some reproduced localized pain R side low back / hip HIP - FABER normal and non tender no limited movement. FADIR with reproduced significant pain on IR motion, Acetabular compression of hip R is mild positive for pain  Strength: Bilateral hip flex/ext 5/5, knee flex/ext 5/5, ankle dorsiflex/plantarflex 5/5 Neurovascular: intact distal sensation to light touch   Neurological: He is alert and oriented to person, place, and time.  Skin: Skin is warm and dry. No rash noted. He is not diaphoretic. No erythema.  Psychiatric: He has a normal mood and  affect. His behavior is normal.  Well groomed, good eye contact, normal speech and thoughts  Nursing note and vitals reviewed.  Results for orders placed or performed in visit on 09/08/17  VITAMIN D 25 Hydroxy (Vit-D Deficiency, Fractures)  Result Value Ref Range   Vit D, 25-Hydroxy 20 (L) 30 - 100 ng/mL  T4, free  Result Value Ref Range   Free T4 1.1 0.8 - 1.8 ng/dL  TSH  Result Value Ref Range   TSH 0.94 0.40 - 4.50 mIU/L  PSA, Total with Reflex to PSA, Free  Result Value Ref Range   PSA, Total 0.3 < OR = 4.0 ng/mL  Lipid panel  Result Value Ref Range   Cholesterol 157 <200 mg/dL   HDL 51 >40 mg/dL   Triglycerides 53 <150 mg/dL   LDL Cholesterol (Calc) 92 mg/dL (calc)   Total CHOL/HDL Ratio 3.1 <5.0 (calc)   Non-HDL Cholesterol (Calc) 106 <130 mg/dL (calc)  COMPLETE METABOLIC PANEL WITH GFR  Result Value Ref Range   Glucose, Bld 101 (H) 65 - 99 mg/dL   BUN 19 7 - 25 mg/dL   Creat 1.08 0.70 - 1.25 mg/dL   GFR, Est Non African American 71 > OR = 60 mL/min/1.45m2   GFR, Est African American 82 > OR = 60 mL/min/1.11m2   BUN/Creatinine Ratio NOT APPLICABLE 6 - 22 (calc)   Sodium 139 135 - 146 mmol/L   Potassium 4.1 3.5 - 5.3 mmol/L   Chloride 102 98 - 110 mmol/L   CO2 29 20 - 32 mmol/L   Calcium 8.7 8.6 - 10.3 mg/dL   Total Protein 6.9 6.1 - 8.1 g/dL   Albumin 3.8 3.6 - 5.1 g/dL   Globulin 3.1 1.9 - 3.7 g/dL (calc)   AG Ratio 1.2 1.0 - 2.5 (calc)   Total Bilirubin 0.6 0.2 - 1.2 mg/dL   Alkaline phosphatase (APISO) 50 40 - 115 U/L   AST 15 10 - 35 U/L   ALT 17 9 - 46 U/L  CBC with Differential/Platelet  Result Value Ref Range   WBC 12.2 (H) 3.8 - 10.8 Thousand/uL   RBC 5.00 4.20 - 5.80 Million/uL   Hemoglobin 15.1 13.2 - 17.1 g/dL   HCT 45.0 38.5 - 50.0 %   MCV 90.0 80.0 - 100.0 fL   MCH 30.2 27.0 - 33.0 pg   MCHC 33.6 32.0 - 36.0 g/dL   RDW 12.4 11.0 - 15.0 %   Platelets 306 140 - 400 Thousand/uL   MPV 9.0 7.5 - 12.5 fL   Neutro Abs 7,735 1,500 - 7,800 cells/uL     Lymphs Abs 3,648 850 - 3,900 cells/uL   WBC mixed population 756 200 - 950 cells/uL   Eosinophils Absolute 24 15 - 500 cells/uL   Basophils Absolute 37 0 - 200 cells/uL   Neutrophils Relative % 63.4 %  Total Lymphocyte 29.9 %   Monocytes Relative 6.2 %   Eosinophils Relative 0.2 %   Basophils Relative 0.3 %  Hemoglobin A1c  Result Value Ref Range   Hgb A1c MFr Bld 6.1 (H) <5.7 % of total Hgb   Mean Plasma Glucose 128 (calc)   eAG (mmol/L) 7.1 (calc)      Assessment & Plan:   Problem List Items Addressed This Visit    None    Visit Diagnoses    Acute right-sided low back pain without sciatica    -  Primary   Relevant Medications   meloxicam (MOBIC) 15 MG tablet   baclofen (LIORESAL) 10 MG tablet   predniSONE (DELTASONE) 10 MG tablet   Acute right hip pain       Relevant Medications   meloxicam (MOBIC) 15 MG tablet   baclofen (LIORESAL) 10 MG tablet      Acute R hip pain x 2 weeks secondary to fall injury, also associated R low back / gluteal symptoms with muscles, likely contusion or sprain. No underlying known OA/DJD. No prior hip or back problem. - No radiation of pain or radicular symptoms - Inadequate conservative therapy   Plan: 1. Start anti-inflammatory trial with rx Meloxicam 15mg  daily wc x 1-2 weeks, then PRN 2. Start muscle relaxant with Baclofen - take 5-10mg  up to BID PRN, titrate up as tolerated - caution sedation - Also printed rx for Prednisone taper 60 to 10mg  over 6 day - ONLY FILL IF NOT IMPROVED or worsening while out on trip next week, stop NSAID if starting 3. May use Tylenol PRN for breakthrough 4. Encouraged use of heating pad 1-2x daily for now then PRN. 5. Given handout for back / hip exercises and to modify activities 5. Follow-up 4-6 weeks if not improving, consider x-rays vs refer to PT vs ortho   Meds ordered this encounter  Medications  . meloxicam (MOBIC) 15 MG tablet    Sig: Take 1 tablet (15 mg total) by mouth daily. For 1-2  weeks then as needed    Dispense:  30 tablet    Refill:  0  . baclofen (LIORESAL) 10 MG tablet    Sig: Take 0.5-1 tablets (5-10 mg total) by mouth 3 (three) times daily as needed for muscle spasms.    Dispense:  30 each    Refill:  0  . predniSONE (DELTASONE) 10 MG tablet    Sig: Only fill if not improved - Take 6 tabs with breakfast Day 1, 5 tabs Day 2, 4 tabs Day 3, 3 tabs Day 4, 2 tabs Day 5, 1 tab Day 6.    Dispense:  21 tablet    Refill:  0     Follow up plan: Return in about 4 weeks (around 12/18/2017), or if symptoms worsen or fail to improve, for Back and Hip Pain.  [name], DO [facility] 11/20/2017, 1:11 PM

## 2017-11-20 NOTE — Patient Instructions (Addendum)
Thank you for coming to the office today.  1. For your Back Pain - I think that this is due to Muscle Spasms or strain.  2. Start with anti-inflammatory Meloxicam 15g once daily with food every day for next 1 to 2 weeks if helping, then can use only as needed 3. Start Baclofen (Lioresal) 10mg  tablets - cut in half for 5mg  at night for muscle relaxant - may make you sedated or sleepy (be careful driving or working on this) if tolerated you can take every 8 hours, half or whole tab 4. May use Tylenol Extra Str 500mg  tabs - may take 1-2 tablets every 6 hours as needed 5. Recommend to start using heating pad on your lower back 1-2x daily for few weeks  This pain may take weeks to months to fully resolve, but hopefully it will respond to the medicine initially. All back injuries (small or serious) are slow to heal since we use our back muscles every day. Be careful with turning, twisting, lifting, sitting / standing for prolonged periods, and avoid re-injury.  If your symptoms significantly worsen with more pain, or new symptoms with weakness in one or both legs, new or different shooting leg pains, numbness in legs or groin, loss of control or retention of urine or bowel movements, please call back for advice and you may need to go directly to the Emergency Department.  IF NOT IMPROVED - can go fill Prednisone  - Start Prednisone taper (steroid anti-inflammatory) for nerve irritation with pain in legs. Each pill is 10mg . Take 6 pills (60mg  daily) for 1 day at same time with breakfast, then each day reduce dose by 1 pill, so 5 pills, then 4, then 3, then 2 then 1 (last 6 days). Do not take any Ibuprofen or Aleve while taking the Prednisone.  - Once finished Prednisone, then start with other anti-inflammatory Meloxicam   Please schedule a Follow-up Appointment to: Return in about 4 weeks (around 12/18/2017), or if symptoms worsen or fail to improve, for Back and Hip Pain.  If you have any other  questions or concerns, please feel free to call the office or send a message through [employer]. You may also schedule an earlier appointment if necessary.  Additionally, you may be receiving a survey about your experience at our office within a few days to 1 week by e-mail or mail. We value your feedback.  [name], DO [facility], [facility]             Low Back Pain Exercises  See other page with pictures of each exercise.  Start with 1 or 2 of these exercises that you are most comfortable with. Do not do any exercises that cause you significant worsening pain. Some of these may cause some "stretching soreness" but it should go away after you stop the exercise, and get better over time. Gradually increase up to 3-4 exercises as tolerated.  Standing hamstring stretch: Place the heel of your leg on a stool about 15 inches high. Keep your knee straight. Lean forward, bending at the hips until you feel a mild stretch in the back of your thigh. Make sure you do not roll your shoulders and bend at the waist when doing this or you will stretch your lower back instead. Hold the stretch for 15 to 30 seconds. Repeat 3 times. Repeat the same stretch on your other leg.  Cat and camel: Get down on your hands and knees. Let your stomach sag,  allowing your back to curve downward. Hold this position for 5 seconds. Then arch your back and hold for 5 seconds. Do 3 sets of 10.  Quadriped Arm/Leg Raises: Get down on your hands and knees. Tighten your abdominal muscles to stiffen your spine. While keeping your abdominals tight, raise one arm and the opposite leg away from you. Hold this position for 5 seconds. Lower your arm and leg slowly and alternate sides. Do this 10 times on each side.  Pelvic tilt: Lie on your back with your knees bent and your feet flat on the floor. Tighten your abdominal muscles and push your lower back into the floor. Hold this position for 5 seconds, then  relax. Do 3 sets of 10.  Partial curl: Lie on your back with your knees bent and your feet flat on the floor. Tighten your stomach muscles and flatten your back against the floor. Tuck your chin to your chest. With your hands stretched out in front of you, curl your upper body forward until your shoulders clear the floor. Hold this position for 3 seconds. Don't hold your breath. It helps to breathe out as you lift your shoulders up. Relax. Repeat 10 times. Build to 3 sets of 10. To challenge yourself, clasp your hands behind your head and keep your elbows out to the side.  Lower trunk rotation: Lie on your back with your knees bent and your feet flat on the floor. Tighten your abdominal muscles and push your lower back into the floor. Keeping your shoulders down flat, gently rotate your legs to one side, then the other as far as you can. Repeat 10 to 20 times.  Single knee to chest stretch: Lie on your back with your legs straight out in front of you. Bring one knee up to your chest and grasp the back of your thigh. Pull your knee toward your chest, stretching your buttock muscle. Hold this position for 15 to 30 seconds and return to the starting position. Repeat 3 times on each side.  Double knee to chest: Lie on your back with your knees bent and your feet flat on the floor. Tighten your abdominal muscles and push your lower back into the floor. Pull both knees up to your chest. Hold for 5 seconds and repeat 10 to 20 times.

## 2017-11-28 ENCOUNTER — Other Ambulatory Visit: Payer: Self-pay | Admitting: Family Medicine

## 2017-11-28 DIAGNOSIS — J019 Acute sinusitis, unspecified: Secondary | ICD-10-CM

## 2017-12-08 ENCOUNTER — Ambulatory Visit: Payer: Medicare Other | Admitting: Urology

## 2017-12-09 ENCOUNTER — Encounter: Payer: Self-pay | Admitting: Urology

## 2017-12-15 ENCOUNTER — Other Ambulatory Visit: Payer: Self-pay | Admitting: Family Medicine

## 2017-12-15 DIAGNOSIS — M545 Low back pain, unspecified: Secondary | ICD-10-CM

## 2017-12-15 DIAGNOSIS — M25551 Pain in right hip: Secondary | ICD-10-CM

## 2018-01-15 ENCOUNTER — Other Ambulatory Visit: Payer: Self-pay | Admitting: Nurse Practitioner

## 2018-01-15 DIAGNOSIS — M545 Low back pain, unspecified: Secondary | ICD-10-CM

## 2018-01-15 DIAGNOSIS — M25551 Pain in right hip: Secondary | ICD-10-CM

## 2018-01-17 ENCOUNTER — Other Ambulatory Visit: Payer: Self-pay | Admitting: Family Medicine

## 2018-01-17 DIAGNOSIS — E7849 Other hyperlipidemia: Secondary | ICD-10-CM

## 2018-01-17 DIAGNOSIS — I10 Essential (primary) hypertension: Secondary | ICD-10-CM

## 2018-01-17 DIAGNOSIS — K219 Gastro-esophageal reflux disease without esophagitis: Secondary | ICD-10-CM

## 2018-02-24 DIAGNOSIS — H2513 Age-related nuclear cataract, bilateral: Secondary | ICD-10-CM | POA: Diagnosis not present

## 2018-02-24 DIAGNOSIS — H16103 Unspecified superficial keratitis, bilateral: Secondary | ICD-10-CM | POA: Diagnosis not present

## 2018-02-24 DIAGNOSIS — H18413 Arcus senilis, bilateral: Secondary | ICD-10-CM | POA: Diagnosis not present

## 2018-02-24 DIAGNOSIS — H401132 Primary open-angle glaucoma, bilateral, moderate stage: Secondary | ICD-10-CM | POA: Diagnosis not present

## 2018-03-04 ENCOUNTER — Encounter: Payer: Self-pay | Admitting: Urology

## 2018-03-04 ENCOUNTER — Ambulatory Visit (INDEPENDENT_AMBULATORY_CARE_PROVIDER_SITE_OTHER): Payer: Medicare Other | Admitting: Urology

## 2018-03-04 VITALS — BP 176/103 | HR 69 | Ht 68.0 in | Wt 225.0 lb

## 2018-03-04 DIAGNOSIS — N5201 Erectile dysfunction due to arterial insufficiency: Secondary | ICD-10-CM

## 2018-03-04 DIAGNOSIS — N401 Enlarged prostate with lower urinary tract symptoms: Secondary | ICD-10-CM | POA: Diagnosis not present

## 2018-03-04 DIAGNOSIS — R350 Frequency of micturition: Secondary | ICD-10-CM | POA: Diagnosis not present

## 2018-03-04 DIAGNOSIS — N400 Enlarged prostate without lower urinary tract symptoms: Secondary | ICD-10-CM

## 2018-03-04 LAB — BLADDER SCAN AMB NON-IMAGING

## 2018-03-04 MED ORDER — SILDENAFIL CITRATE 20 MG PO TABS: ORAL_TABLET | ORAL | 0 refills | Status: DC

## 2018-03-04 MED ORDER — TAMSULOSIN HCL 0.4 MG PO CAPS: 0.4000 mg | ORAL_CAPSULE | Freq: Every day | ORAL | 3 refills | Status: DC

## 2018-03-04 NOTE — Progress Notes (Signed)
03/04/2018 9:20 PM   Curtis Gregory [id number] [id number]  Referring provider: Olin Hauser, DO [address] [place], [place] [id number]  Chief Complaint  Patient presents with  . Benign Prostatic Hypertrophy    HPI: Curtis Gregory is a 68 yo M returns today for f/u of BPH with LUTS and prostate cancer screening. -Last seen by Nickie Retort in 08/14/2016 -CPAP machine resolved nocturia however c/o increased urinary frequency and urgency during day  -Ran out of flomax 3 months ago -remains on finasteride  - IPSS is 10/2, PVR is 0 mL   -Reports of erectile dysfunction -Partial erections, firm enough for penetration less than 50 percent of time  -No abnormal curvature or pain with erections   PSA Trend 05/2015 PSA 0.7 / DRE 70gm smooth.  (prior to finasteride) 05/2016 PSA 0.4   08/2017 PSA 0.3   IPSS    Row Name 03/04/18 1000         International Prostate Symptom Score   How often have you had the sensation of not emptying your bladder?  Less than half the time     How often have you had to urinate less than every two hours?  About half the time     How often have you found you stopped and started again several times when you urinated?  Not at All     How often have you found it difficult to postpone urination?  Less than half the time     How often have you had a weak urinary stream?  Less than half the time     How often have you had to strain to start urination?  Not at All     How many times did you typically get up at night to urinate?  1 Time     Total IPSS Score  10       Quality of Life due to urinary symptoms   If you were to spend the rest of your life with your urinary condition just the way it is now how would you feel about that?  Mostly Satisfied        Score:  1-7 Mild 8-19 Moderate 20-35 Severe   PMH: Past Medical History:  Diagnosis Date  . Asthma   . Benign prostatic hypertrophy with urinary frequency 06/16/2014  . Excessive urination  at night 06/16/2014  . GERD (gastroesophageal reflux disease)   . Glaucoma   . Hyperlipemia   . Sleep apnea     Surgical History: Past Surgical History:  Procedure Laterality Date  . COLONOSCOPY WITH PROPOFOL N/A 09/11/2016   Procedure: COLONOSCOPY WITH PROPOFOL;  Surgeon: Lucilla Lame, MD;  Location: [facility];  Service: Gastroenterology;  Laterality: N/A;  sleep apnea  . none    . POLYPECTOMY N/A 09/11/2016   Procedure: POLYPECTOMY;  Surgeon: Lucilla Lame, MD;  Location: [facility];  Service: Gastroenterology;  Laterality: N/A;    Home Medications:  Allergies as of 03/04/2018   No Known Allergies     Medication List       Accurate as of March 04, 2018  9:20 PM. Always use your most recent med list.        albuterol 108 (90 Base) MCG/ACT inhaler Commonly known as:  PROVENTIL HFA;VENTOLIN HFA Inhale into the lungs. Reported on 06/04/2015   baclofen 10 MG tablet Commonly known as:  LIORESAL Take 0.5-1 tablets (5-10 mg total) by mouth 3 (three) times daily as needed for muscle  spasms.   fexofenadine 180 MG tablet Commonly known as:  ALLEGRA ALLERGY Take 1 tablet (180 mg total) by mouth daily.   finasteride 5 MG tablet Commonly known as:  PROSCAR Take 1 tablet (5 mg total) by mouth daily.   fluticasone 50 MCG/ACT nasal spray Commonly known as:  FLONASE Place 2 sprays into both nostrils daily. Use for 4-6 weeks then stop and use seasonally or as needed.   hydrochlorothiazide 12.5 MG tablet Commonly known as:  HYDRODIURIL Take 1 tablet (12.5 mg total) by mouth daily.   hydrocortisone 25 MG suppository Commonly known as:  ANUSOL-HC Place 1 suppository (25 mg total) rectally 2 (two) times daily. For 7 days   ipratropium 0.06 % nasal spray Commonly known as:  ATROVENT USE 2 SPRAYS IN EACH NOSTRIL FOUR TIMES DAILY FOR UP TO 5 TO 7 DAYS THEN STOP   latanoprost 0.005 % ophthalmic solution Commonly known as:  XALATAN INSTILL ONE DROP IN BOTH EYES  QHS.   meloxicam 15 MG tablet Commonly known as:  MOBIC TAKE 1 TABLET(15 MG) BY MOUTH DAILY FOR 1 TO 2 WEEKS THEN AS NEEDED   multivitamin tablet Take 1 tablet by mouth daily.   omeprazole 20 MG capsule Commonly known as:  PRILOSEC TAKE 1 CAPSULE(20 MG) BY MOUTH DAILY   OVER THE COUNTER MEDICATION Prime test testosterone booster   predniSONE 10 MG tablet Commonly known as:  DELTASONE Only fill if not improved - Take 6 tabs with breakfast Day 1, 5 tabs Day 2, 4 tabs Day 3, 3 tabs Day 4, 2 tabs Day 5, 1 tab Day 6.   rosuvastatin 20 MG tablet Commonly known as:  CRESTOR TAKE 1 TABLET(20 MG) BY MOUTH AT BEDTIME   tamsulosin 0.4 MG Caps capsule Commonly known as:  FLOMAX TAKE 2 CAPSULES BY MOUTH EVERY MORNING AT THE SAME TIME EACH DAY   timolol 0.5 % ophthalmic gel-forming Commonly known as:  TIMOPTIC-XR INT 1 GTT INTO OS QD       Allergies: No Known Allergies  Family History: Family History  Problem Relation Age of Onset  . Diabetes Mother   . Heart disease Mother   . Alzheimer's disease Father   . Prostate cancer Father   . Prostate cancer Brother   . Kidney cancer Neg Hx     Social History:  reports that he has never smoked. He has never used smokeless tobacco. He reports current alcohol use of about 1.0 standard drinks of alcohol per week. He reports that he does not use drugs.  ROS: UROLOGY Frequent Urination?: Yes Hard to postpone urination?: No Burning/pain with urination?: No Get up at night to urinate?: Yes Leakage of urine?: No Urine stream starts and stops?: No Trouble starting stream?: Yes Do you have to strain to urinate?: No Blood in urine?: No Urinary tract infection?: No Sexually transmitted disease?: No Injury to kidneys or bladder?: No Painful intercourse?: No Weak stream?: Yes Erection problems?: Yes Penile pain?: No  Gastrointestinal Nausea?: No Vomiting?: No Indigestion/heartburn?: No Diarrhea?: No Constipation?:  No  Constitutional Fever: No Night sweats?: No Weight loss?: No Fatigue?: No  Skin Skin rash/lesions?: No Itching?: No  Eyes Blurred vision?: No Double vision?: No  Ears/Nose/Throat Sore throat?: No Sinus problems?: No  Hematologic/Lymphatic Swollen glands?: No Easy bruising?: No  Cardiovascular Leg swelling?: No Chest pain?: No  Respiratory Cough?: No Shortness of breath?: No  Endocrine Excessive thirst?: No  Musculoskeletal Back pain?: No Joint pain?: No  Neurological Headaches?: No Dizziness?: No  Psychologic Depression?: No Anxiety?: No  Physical Exam: BP (!) 176/103 (BP Location: Left Arm, Patient Position: Sitting)   Pulse 69   Ht 5\' 8"  (1.727 m)   Wt 225 lb (102.1 kg)   BMI 34.21 kg/m   Constitutional:  Well nourished. Alert and oriented, No acute distress. HEENT: [place] AT, moist mucus membranes.  Trachea midline, no masses. Cardiovascular: No clubbing, cyanosis, or edema. Respiratory: Normal respiratory effort, no increased work of breathing. Rectal: Patient with  normal sphincter tone. Anus and perineum without scarring or rashes. No rectal masses are appreciated. Smooth rectal tone. Prostate is approximately 35 grams, no nodules are appreciated. Seminal vesicles are normal. Skin: No rashes, bruises or suspicious lesions. Neurologic: Grossly intact, no focal deficits, moving all 4 extremities. Psychiatric: Normal mood and affect.  Laboratory Data: PSA Trend 05/2015 PSA 0.7 / DRE 70gm smooth.  (prior to finasteride) 05/2016 PSA 0.4   08/2017 PSA 0.3   Pertinent Imaging: Results for orders placed or performed in visit on 03/04/18  Bladder Scan (Post Void Residual) in office  Result Value Ref Range   Scan Result 57ml     Assessment & Plan:    1. BPH with LUTS  -Most recent PSA 0.3, 08/2017  -IPSS is 10/2 -PVR is 0 mL, voiding well  -restart tamsulosin -Continue finasteride  -F/u annually  2. Erectile dysfunction -new  problem -Partial erections, firm enough for penetration less than 50 percent of time  -Pt interested trial of generic sildenafil -Rx of Generic Viagara given, take 1 hour before intercourse  Return in about 1 year (around 03/05/2019).  Abbie Sons, [facility] [address] [place],  [id number] [phone]  I, Lucas Mallow, am acting as a scribe for Dr. Nicki Reaper C. ,  I, Abbie Sons, MD, have reviewed all documentation for this visit. The documentation on 03/04/18 for the exam, diagnosis, procedures, and orders are all accurate and complete.

## 2018-03-04 NOTE — Patient Instructions (Signed)

## 2018-03-08 ENCOUNTER — Telehealth: Payer: Self-pay | Admitting: Urology

## 2018-03-08 NOTE — Telephone Encounter (Signed)
Pt called and states that his meds that were called in was going to cost him over $500.00 dollars, he requests a call back to change rx. He didn't know the name of it.

## 2018-03-10 NOTE — Telephone Encounter (Signed)
Pt called again stating he never received a return call.  He said [facility] told him if Sildenafil cost more than $1 per pill to call back.  Pt uses [facility] on [address].

## 2018-03-11 MED ORDER — FINASTERIDE 5 MG PO TABS: 5.0000 mg | ORAL_TABLET | Freq: Every day | ORAL | 11 refills | Status: DC

## 2018-03-11 NOTE — Telephone Encounter (Signed)
Spoke to patient and he wanted finasteride sent in. The flomax was already filled.

## 2018-03-11 NOTE — Telephone Encounter (Signed)
Pt also needs Flomax called into [facility]. Advised pt to have his Sildenafil filled at [facility] and referred him to the Good Rx coupon online. Please advise about the Flomax.

## 2018-03-23 ENCOUNTER — Ambulatory Visit: Payer: Medicare Other | Admitting: Family Medicine

## 2018-04-20 ENCOUNTER — Other Ambulatory Visit: Payer: Self-pay

## 2018-04-20 DIAGNOSIS — N401 Enlarged prostate with lower urinary tract symptoms: Secondary | ICD-10-CM

## 2018-04-20 DIAGNOSIS — N5201 Erectile dysfunction due to arterial insufficiency: Secondary | ICD-10-CM

## 2018-04-20 DIAGNOSIS — R350 Frequency of micturition: Secondary | ICD-10-CM

## 2018-04-20 MED ORDER — SILDENAFIL CITRATE 20 MG PO TABS: ORAL_TABLET | ORAL | 3 refills | Status: DC

## 2018-07-28 DIAGNOSIS — H16103 Unspecified superficial keratitis, bilateral: Secondary | ICD-10-CM | POA: Diagnosis not present

## 2018-07-28 DIAGNOSIS — H18413 Arcus senilis, bilateral: Secondary | ICD-10-CM | POA: Diagnosis not present

## 2018-07-28 DIAGNOSIS — H2513 Age-related nuclear cataract, bilateral: Secondary | ICD-10-CM | POA: Diagnosis not present

## 2018-07-28 DIAGNOSIS — H401132 Primary open-angle glaucoma, bilateral, moderate stage: Secondary | ICD-10-CM | POA: Diagnosis not present

## 2018-08-26 DIAGNOSIS — H2513 Age-related nuclear cataract, bilateral: Secondary | ICD-10-CM | POA: Diagnosis not present

## 2018-08-26 DIAGNOSIS — H401132 Primary open-angle glaucoma, bilateral, moderate stage: Secondary | ICD-10-CM | POA: Diagnosis not present

## 2018-08-26 DIAGNOSIS — H16103 Unspecified superficial keratitis, bilateral: Secondary | ICD-10-CM | POA: Diagnosis not present

## 2018-08-26 DIAGNOSIS — H18413 Arcus senilis, bilateral: Secondary | ICD-10-CM | POA: Diagnosis not present

## 2018-08-30 DIAGNOSIS — H18413 Arcus senilis, bilateral: Secondary | ICD-10-CM | POA: Diagnosis not present

## 2018-08-30 DIAGNOSIS — H16103 Unspecified superficial keratitis, bilateral: Secondary | ICD-10-CM | POA: Diagnosis not present

## 2018-08-30 DIAGNOSIS — H2513 Age-related nuclear cataract, bilateral: Secondary | ICD-10-CM | POA: Diagnosis not present

## 2018-08-30 DIAGNOSIS — H401132 Primary open-angle glaucoma, bilateral, moderate stage: Secondary | ICD-10-CM | POA: Diagnosis not present

## 2018-09-08 ENCOUNTER — Encounter: Payer: Self-pay | Admitting: Family Medicine

## 2018-09-08 ENCOUNTER — Other Ambulatory Visit: Payer: Self-pay

## 2018-09-08 ENCOUNTER — Ambulatory Visit (INDEPENDENT_AMBULATORY_CARE_PROVIDER_SITE_OTHER): Payer: Medicare Other | Admitting: Family Medicine

## 2018-09-08 VITALS — BP 122/67 | HR 62 | Ht 68.0 in | Wt 221.6 lb

## 2018-09-08 DIAGNOSIS — G8929 Other chronic pain: Secondary | ICD-10-CM

## 2018-09-08 DIAGNOSIS — Z23 Encounter for immunization: Secondary | ICD-10-CM | POA: Diagnosis not present

## 2018-09-08 DIAGNOSIS — J302 Other seasonal allergic rhinitis: Secondary | ICD-10-CM | POA: Diagnosis not present

## 2018-09-08 DIAGNOSIS — M25561 Pain in right knee: Secondary | ICD-10-CM

## 2018-09-08 DIAGNOSIS — M150 Primary generalized (osteo)arthritis: Secondary | ICD-10-CM | POA: Diagnosis not present

## 2018-09-08 DIAGNOSIS — M159 Polyosteoarthritis, unspecified: Secondary | ICD-10-CM

## 2018-09-08 MED ORDER — METHYLPREDNISOLONE ACETATE 40 MG/ML IJ SUSP
40.0000 mg | Freq: Once | INTRAMUSCULAR | Status: AC
  Administered 2018-09-08: 40 mg via INTRA_ARTICULAR

## 2018-09-08 MED ORDER — FEXOFENADINE HCL 180 MG PO TABS: 180.0000 mg | ORAL_TABLET | Freq: Every day | ORAL | 3 refills | Status: DC

## 2018-09-08 MED ORDER — LIDOCAINE HCL (PF) 1 % IJ SOLN
4.0000 mL | Freq: Once | INTRAMUSCULAR | Status: AC
  Administered 2018-09-08: 4 mL

## 2018-09-08 MED ORDER — MELOXICAM 15 MG PO TABS: 15.0000 mg | ORAL_TABLET | Freq: Every day | ORAL | 2 refills | Status: DC | PRN

## 2018-09-08 NOTE — Progress Notes (Signed)
Subjective:    Patient ID: Curtis Gregory, male    DOB: February 18, 1950, 68 y.o.   MRN: [id number]  Curtis Gregory is a 68 y.o. male presenting on 09/08/2018 for Knee Pain (chronic Rt knee pain. Pain worsen with movement. The pt would like another injection in the knee. )   HPI   RIGHT KNEE PAIN Known history of chronic recurrent R knee pain over many years >15-20 years or more. [place] knee injections every 5 years. Improvement previously, unsure if diagnosed arthritis, no recent imaging. - Recent history he has had some gradual worsening R knee pain and aching, worse with flare up at times, he had a slip trip injury few month ago thinks he twisted knee. He has not had any locking or instability of knee. Used sleeve before. - Using topical muscle rub for arthritis, rx from his wife - Not using ice packs heat or wrap - He is doing better with more exercising and  - No prior knee injury or surgery or known diagnosis. - Denies numbness tingling weakness in the legs, trauma, swelling, redness erythema  Seasonal Environmental Allergies Request re order on Allegra with coupon goodrx  Health Maintenance: Due for Flu Shot, will receive today  Due for 2nd pneumonia vaccine >1 year after previous vaccine, now to receive Pneumovax-23 today   Depression screen [facility] 2/9 11/20/2017 09/18/2017 09/15/2017  Decreased Interest 0 0 0  Down, Depressed, Hopeless 0 0 0  PHQ - 2 Score 0 0 0    Social History   Tobacco Use  . Smoking status: Never Smoker  . Smokeless tobacco: Never Used  Substance Use Topics  . Alcohol use: Yes    Alcohol/week: 1.0 standard drinks    Types: 1 Glasses of wine per week    Comment: occasional  . Drug use: No    Review of Systems Per HPI unless specifically indicated above     Objective:    BP 122/67   Pulse 62   Ht 5\' 8"  (1.727 m)   Wt 221 lb 9.6 oz (100.5 kg)   BMI 33.69 kg/m   Wt Readings from Last 3 Encounters:  09/08/18 221 lb 9.6 oz  (100.5 kg)  03/04/18 225 lb (102.1 kg)  11/20/17 225 lb 5.8 oz (102.2 kg)    Physical Exam Vitals signs and nursing note reviewed.  Constitutional:      General: He is not in acute distress.    Appearance: He is well-developed. He is not diaphoretic.     Comments: Well-appearing, comfortable, cooperative  HENT:     Head: Normocephalic and atraumatic.  Eyes:     General:        Right eye: No discharge.        Left eye: No discharge.     Conjunctiva/sclera: Conjunctivae normal.  Cardiovascular:     Rate and Rhythm: Normal rate.  Pulmonary:     Effort: Pulmonary effort is normal.  Musculoskeletal:     Comments: Right  Knee Inspection: Mild bulky appearance. No ecchymosis or effusion. Palpation: Mild +TTP R knee only medial joint line. Fine crepitus ROM: Full active ROM bilaterally Special Testing: Lachman / Valgus/Varus tests negative with intact ligaments (ACL, MCL, LCL). Standing Thessaly negative for provoked meniscus pain Strength: 5/5 intact knee flex/ext, ankle dorsi/plantarflex Neurovascular: distally intact sensation light touch and pulses   Skin:    General: Skin is warm and dry.     Findings: No erythema or rash.  Neurological:  Mental Status: He is alert and oriented to person, place, and time.  Psychiatric:        Behavior: Behavior normal.     Comments: Well groomed, good eye contact, normal speech and thoughts    ________________________________________________________ PROCEDURE NOTE Date: 09/08/18 Right knee steroid injection Discussed benefits and risks (including pain, bleeding, infection, steroid flare). Verbal consent given by patient. Medication:  1 cc Depo-medrol 40mg  and 4 cc Lidocaine 1% without epi Time Out taken  Landmarks identified. Area cleansed with alcohol wipes. Using 21 gauge and 1, 1/2 inch needle, Right knee joint space was injected (with above listed medication) via medial approach cold spray used for superficial anesthetic. Sterile  bandage placed. Patient tolerated procedure well without bleeding or paresthesias. No complications.   Results for orders placed or performed in visit on 03/04/18  Bladder Scan (Post Void Residual) in office  Result Value Ref Range   Scan Result 39ml       Assessment & Plan:   Problem List Items Addressed This Visit    Chronic pain of right knee - Primary   Relevant Medications   meloxicam (MOBIC) 15 MG tablet   Seasonal allergies   Relevant Medications   fexofenadine (ALLEGRA ALLERGY) 180 MG tablet    Other Visit Diagnoses    Need for 23-polyvalent pneumococcal polysaccharide vaccine       Relevant Orders   Pneumococcal polysaccharide vaccine 23-valent greater than or equal to 2yo subcutaneous/IM (Completed)   Needs flu shot       Relevant Orders   Flu Vaccine QUAD High Dose(Fluad) (Completed)   Primary osteoarthritis involving multiple joints       Relevant Medications   meloxicam (MOBIC) 15 MG tablet   methylPREDNISolone acetate (DEPO-MEDROL) injection 40 mg (Completed)      Subacute on chronic R medial vs generalized Knee pain without known injury or trauma but had a possible twisting injury Known chronic knee pain with suspected likely due to underlying osteoarthritis / DJD with 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 - Able to bear weight, no knee instability or locking - No prior history of knee surgery, arthroscopy - S/p injections with good results years ago - Inadequate conservative therapy   Plan: Right knee steroid joint injection today, see above, tolerated well  1. Re order anti-inflammatory trial with rx Meloxicam 15mg  daily wc x 2-4 weeks, then PRN 2. Start Tylenol 500-1000mg  per dose TID PRN breakthrough 3. RICE therapy (rest, ice, compression, elevation) for swelling, activity modification 4. Follow-up 4 weeks, if still worsening, consider X-ray PT vs Ortho   Meds ordered this encounter  Medications  . meloxicam (MOBIC) 15 MG tablet    Sig: Take 1 tablet (15  mg total) by mouth daily as needed for pain.    Dispense:  30 tablet    Refill:  2  . lidocaine (PF) (XYLOCAINE) 1 % injection 4 mL  . methylPREDNISolone acetate (DEPO-MEDROL) injection 40 mg  . fexofenadine (ALLEGRA ALLERGY) 180 MG tablet    Sig: Take 1 tablet (180 mg total) by mouth daily.    Dispense:  90 tablet    Refill:  3      Follow up plan: Return in about 4 weeks (around 10/06/2018), or if symptoms worsen or fail to improve, for Knee pain if not improved.   [name], DO [facility] 09/08/2018, 2:13 PM

## 2018-09-08 NOTE — Patient Instructions (Addendum)
Thank you for coming to the office today.  You received a Right Knee Joint steroid injection today. - Lidocaine numbing medicine may ease the pain initially for a few hours until it wears off - As discussed, you may experience a "steroid flare" this evening or within 24-48 hours, anytime medicine is injected into an inflamed joint it can cause the pain to get worse temporarily - Everyone responds differently to these injections, it depends on the patient and the severity of the joint problem, it may provide anywhere from days to weeks, to months of relief. Ideal response is >6 months relief - Try to take it easy for next 1-2 days, avoid over activity and strain on joint (limit walking for knee) - Recommend the following:   - For swelling - rest, compression sleeve / ACE wrap, elevation, and ice packs as needed for first few days   - For pain in future may use heating pad or moist heat as needed  Medication - reordered meloxicam - take as needed once daily with food. - Recommend to start taking Tylenol Extra Strength 500mg  tabs - take 1 to 2 tabs per dose (max 1000mg ) every 6-8 hours for pain (take regularly, don't skip a dose for next 7 days), max 24 hour daily dose is 6 tablets or 3000mg . In the future you can repeat the same everyday Tylenol course for 1-2 weeks at a time.   Muscle rub is good too ----------------------------------------------  High dose flu shot today  Pneumovax-23 - final pneumonia vaccine today as well   Please schedule a Follow-up Appointment to: Return in about 4 weeks (around 10/06/2018), or if symptoms worsen or fail to improve, for Knee pain if not improved.  If you have any other questions or concerns, please feel free to call the office or send a message through [employer]. You may also schedule an earlier appointment if necessary.  Additionally, you may be receiving a survey about your experience at our office within a few days to 1 week by e-mail or mail. We  value your feedback.  [name], DO [facility]

## 2018-09-15 ENCOUNTER — Telehealth: Payer: Self-pay | Admitting: Family Medicine

## 2018-09-15 NOTE — Telephone Encounter (Signed)
I called the patient to schedule AWV with Tiffany.  There was no answer, and I was unable to leave a message because the voice mailbox was vull. VDM (DD)

## 2018-09-24 ENCOUNTER — Telehealth: Payer: Self-pay | Admitting: Family Medicine

## 2018-09-24 NOTE — Telephone Encounter (Signed)
I called the patient to schedule AWV with Tiffany.  He said that he will call back. VDM (DD)

## 2018-11-09 ENCOUNTER — Other Ambulatory Visit: Payer: Self-pay | Admitting: Family Medicine

## 2018-11-09 DIAGNOSIS — I10 Essential (primary) hypertension: Secondary | ICD-10-CM

## 2018-11-09 DIAGNOSIS — K219 Gastro-esophageal reflux disease without esophagitis: Secondary | ICD-10-CM

## 2018-11-16 ENCOUNTER — Other Ambulatory Visit: Payer: Self-pay | Admitting: Urology

## 2018-11-16 DIAGNOSIS — N401 Enlarged prostate with lower urinary tract symptoms: Secondary | ICD-10-CM

## 2018-11-16 DIAGNOSIS — N5201 Erectile dysfunction due to arterial insufficiency: Secondary | ICD-10-CM

## 2018-12-08 ENCOUNTER — Other Ambulatory Visit: Payer: Self-pay

## 2019-01-03 ENCOUNTER — Other Ambulatory Visit: Payer: Self-pay

## 2019-01-03 ENCOUNTER — Encounter: Payer: Self-pay | Admitting: Family Medicine

## 2019-01-03 ENCOUNTER — Ambulatory Visit (INDEPENDENT_AMBULATORY_CARE_PROVIDER_SITE_OTHER): Payer: Medicare Other | Admitting: Family Medicine

## 2019-01-03 VITALS — BP 128/71 | HR 69 | Temp 97.7°F | Resp 16 | Ht 68.0 in | Wt 221.0 lb

## 2019-01-03 DIAGNOSIS — M25561 Pain in right knee: Secondary | ICD-10-CM

## 2019-01-03 DIAGNOSIS — M8949 Other hypertrophic osteoarthropathy, multiple sites: Secondary | ICD-10-CM

## 2019-01-03 DIAGNOSIS — M159 Polyosteoarthritis, unspecified: Secondary | ICD-10-CM

## 2019-01-03 DIAGNOSIS — G8929 Other chronic pain: Secondary | ICD-10-CM | POA: Diagnosis not present

## 2019-01-03 MED ORDER — METHYLPREDNISOLONE ACETATE 40 MG/ML IJ SUSP
40.0000 mg | Freq: Once | INTRAMUSCULAR | Status: AC
  Administered 2019-01-03: 40 mg via INTRA_ARTICULAR

## 2019-01-03 MED ORDER — MELOXICAM 15 MG PO TABS: 15.0000 mg | ORAL_TABLET | Freq: Every day | ORAL | 2 refills | Status: DC | PRN

## 2019-01-03 MED ORDER — LIDOCAINE HCL (PF) 1 % IJ SOLN
4.0000 mL | Freq: Once | INTRAMUSCULAR | Status: AC
  Administered 2019-01-03: 4 mL

## 2019-01-03 NOTE — Progress Notes (Signed)
Subjective:    Patient ID: Curtis Gregory, male    DOB: 17-Apr-1950, 67 y.o.   MRN: [id number]  Curtis Gregory is a 68 y.o. male presenting on 01/03/2019 for Knee Pain   HPI   RIGHT KNEE PAIN - Last orthopedic >10 years ago, in [place] prior ortho had injections >10 year ago. - Last visit with me 08/2018, for same problem, treated with steroid injection with good results now wearing off request repeat, see prior notes for background information. - Today patient reports return of R knee pain, episodic flare, no recent injury. Not ready to return to orthopedic. Aching pain episodic worse. Prior slip injury. - Using topical muscle rub for arthritis, rx from his wife - Not using ice packs heat or wrap - He is doing better with more exercising and meloxicam PRN nsaid - No prior knee injury or surgery or known diagnosis. - Denies numbness tingling weakness in the legs, trauma, swelling, redness erythema    Depression screen [facility] 2/9 01/03/2019 11/20/2017 09/18/2017  Decreased Interest 0 0 0  Down, Depressed, Hopeless 0 0 0  PHQ - 2 Score 0 0 0    Social History   Tobacco Use  . Smoking status: Never Smoker  . Smokeless tobacco: Never Used  Substance Use Topics  . Alcohol use: Yes    Alcohol/week: 1.0 standard drinks    Types: 1 Glasses of wine per week    Comment: occasional  . Drug use: No    Review of Systems Per HPI unless specifically indicated above     Objective:    BP 128/71   Pulse 69   Temp 97.7 F (36.5 C) (Oral)   Resp 16   Ht 5\' 8"  (1.727 m)   Wt 221 lb (100.2 kg)   BMI 33.60 kg/m   Wt Readings from Last 3 Encounters:  01/03/19 221 lb (100.2 kg)  09/08/18 221 lb 9.6 oz (100.5 kg)  03/04/18 225 lb (102.1 kg)    Physical Exam Vitals and nursing note reviewed.  Constitutional:      General: He is not in acute distress.    Appearance: He is well-developed. He is not diaphoretic.     Comments: Well-appearing, comfortable, cooperative  HENT:     Head:  Normocephalic and atraumatic.  Eyes:     General:        Right eye: No discharge.        Left eye: No discharge.     Conjunctiva/sclera: Conjunctivae normal.  Cardiovascular:     Rate and Rhythm: Normal rate.  Pulmonary:     Effort: Pulmonary effort is normal.  Musculoskeletal:     Comments: Right  Knee Inspection: Mild bulky appearance. No ecchymosis or effusion. Palpation: Mild +TTP R knee only medial joint line. Fine crepitus ROM: Full active ROM bilaterally Special Testing: Lachman / Valgus/Varus tests negative with intact ligaments (ACL, MCL, LCL). Standing Thessaly negative for provoked meniscus pain Strength: 5/5 intact knee flex/ext, ankle dorsi/plantarflex Neurovascular: distally intact sensation light touch and pulses   Skin:    General: Skin is warm and dry.     Findings: No erythema or rash.  Neurological:     Mental Status: He is alert and oriented to person, place, and time.  Psychiatric:        Behavior: Behavior normal.     Comments: Well groomed, good eye contact, normal speech and thoughts      ________________________________________________________ PROCEDURE NOTE Date: 01/03/19 Right knee steroid injection Discussed benefits  and risks (including pain, bleeding, infection, steroid flare). Verbal consent given by patient. Medication:  1 cc Depo-medrol 40mg  and 4 cc Lidocaine 1% without epi Time Out taken  Landmarks identified. Area cleansed with alcohol wipes.Using 21 gauge and 1, 1/2 inch needle, Right knee joint space was injected (with above listed medication) via lateral approach cold spray used for superficial anesthetic.Sterile bandage placed.Patient tolerated procedure well without bleeding or paresthesias.No complications.  Results for orders placed or performed in visit on 03/04/18  Bladder Scan (Post Void Residual) in office  Result Value Ref Range   Scan Result 44ml       Assessment & Plan:   Problem List Items Addressed This Visit     Chronic pain of right knee - Primary   Relevant Medications   meloxicam (MOBIC) 15 MG tablet    Other Visit Diagnoses    Primary osteoarthritis involving multiple joints       Relevant Medications   meloxicam (MOBIC) 15 MG tablet   lidocaine (PF) (XYLOCAINE) 1 % injection 4 mL (Completed)   methylPREDNISolone acetate (DEPO-MEDROL) injection 40 mg (Completed)      Subacute on chronic R medial vs generalized Knee pain Known chronic knee pain with suspected likely due to underlying osteoarthritis / DJD with 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 - Able to bear weight, no knee instability or locking - No prior history of knee surgery, arthroscopy - S/p injection 4 month ago good results, now due for repeat  Plan: #2 Right knee steroid joint injection today, see above, tolerated well  1. Re order anti-inflammatory trial with rx Meloxicam 15mg  daily wc x 2-4 weeks, then PRN 2. Start Tylenol 500-1000mg  per dose TID PRN breakthrough 3. RICE therapy (rest, ice, compression, elevation) for swelling, activity modification  F/u if not improved refer to ortho  Meds ordered this encounter  Medications  . meloxicam (MOBIC) 15 MG tablet    Sig: Take 1 tablet (15 mg total) by mouth daily as needed for pain.    Dispense:  30 tablet    Refill:  2  . lidocaine (PF) (XYLOCAINE) 1 % injection 4 mL  . methylPREDNISolone acetate (DEPO-MEDROL) injection 40 mg      Follow up plan: Return in about 3 months (around 04/03/2019), or if symptoms worsen or fail to improve, for knee pain.   [name], DO [facility] 01/03/2019, 11:53 AM

## 2019-01-03 NOTE — Patient Instructions (Addendum)
Thank you for coming to the office today.  You received a Right Knee Joint steroid injection today. - Lidocaine numbing medicine may ease the pain initially for a few hours until it wears off - As discussed, you may experience a "steroid flare" this evening or within 24-48 hours, anytime medicine is injected into an inflamed joint it can cause the pain to get worse temporarily - Everyone responds differently to these injections, it depends on the patient and the severity of the joint problem, it may provide anywhere from days to weeks, to months of relief. Ideal response is >6 months relief - Try to take it easy for next 1-2 days, avoid over activity and strain on joint (limit walking for knee) - Recommend the following:   - For swelling - rest, compression sleeve / ACE wrap, elevation, and ice packs as needed for first few days   - For pain in future may use heating pad or moist heat as needed  Medication - reordered meloxicam - take as needed once daily with food. - Recommend to start taking Tylenol Extra Strength 500mg  tabs - take 1 to 2 tabs per dose (max 1000mg ) every 6-8 hours for pain (take regularly, don't skip a dose for next 7 days), max 24 hour daily dose is 6 tablets or 3000mg . In the future you can repeat the same everyday Tylenol course for 1-2 weeks at a time.    If this shot is not as effective or wears off within 6 wk to 3 months, can contact us and request referral to Orthopedic specialist - provide name / location.   Please schedule a Follow-up Appointment to: Return in about 3 months (around 04/03/2019), or if symptoms worsen or fail to improve, for knee pain.  If you have any other questions or concerns, please feel free to call the office or send a message through [facility]. You may also schedule an earlier appointment if necessary.  Additionally, you may be receiving a survey about your experience at our office within a few days to 1 week by e-mail or mail. We value your  feedback.  [name], DO [facility]

## 2019-01-04 ENCOUNTER — Ambulatory Visit (INDEPENDENT_AMBULATORY_CARE_PROVIDER_SITE_OTHER): Payer: Medicare Other

## 2019-01-04 VITALS — Ht 68.0 in | Wt 221.0 lb

## 2019-01-04 DIAGNOSIS — Z0000 Encounter for general adult medical examination without abnormal findings: Secondary | ICD-10-CM

## 2019-01-04 NOTE — Patient Instructions (Signed)
Curtis Gregory , Thank you for taking time to come for your Medicare Wellness Visit. I appreciate your ongoing commitment to your health goals. Please review the following plan we discussed and let me know if I can assist you in the future.   Screening recommendations/referrals: Colonoscopy: completed 2018, due 2023 Recommended yearly ophthalmology/optometry visit for glaucoma screening and checkup Recommended yearly dental visit for hygiene and checkup  Vaccinations: Influenza vaccine: up to date Pneumococcal vaccine: up to date Tdap vaccine: up to date Shingles vaccine: shingrix eligible     Advanced directives: Please bring a copy of your health care power of attorney and living will to the office at your convenience.  Conditions/risks identified: none   Next appointment: Follow up in one year for your annual wellness visit   Preventive Care 65 Years and Older, Male Preventive care refers to lifestyle choices and visits with your health care provider that can promote health and wellness. What does preventive care include?  A yearly physical exam. This is also called an annual well check.  Dental exams once or twice a year.  Routine eye exams. Ask your health care provider how often you should have your eyes checked.  Personal lifestyle choices, including:  Daily care of your teeth and gums.  Regular physical activity.  Eating a healthy diet.  Avoiding tobacco and drug use.  Limiting alcohol use.  Practicing safe sex.  Taking low doses of aspirin every day.  Taking vitamin and mineral supplements as recommended by your health care provider. What happens during an annual well check? The services and screenings done by your health care provider during your annual well check will depend on your age, overall health, lifestyle risk factors, and family history of disease. Counseling  Your health care provider may ask you questions about your:  Alcohol use.  Tobacco  use.  Drug use.  Emotional well-being.  Home and relationship well-being.  Sexual activity.  Eating habits.  History of falls.  Memory and ability to understand (cognition).  Work and work Statistician. Screening  You may have the following tests or measurements:  Height, weight, and BMI.  Blood pressure.  Lipid and cholesterol levels. These may be checked every 5 years, or more frequently if you are over 2 years old.  Skin check.  Lung cancer screening. You may have this screening every year starting at age 72 if you have a 30-pack-year history of smoking and currently smoke or have quit within the past 15 years.  Fecal occult blood test (FOBT) of the stool. You may have this test every year starting at age 98.  Flexible sigmoidoscopy or colonoscopy. You may have a sigmoidoscopy every 5 years or a colonoscopy every 10 years starting at age 10.  Prostate cancer screening. Recommendations will vary depending on your family history and other risks.  Hepatitis C blood test.  Hepatitis B blood test.  Sexually transmitted disease (STD) testing.  Diabetes screening. This is done by checking your blood sugar (glucose) after you have not eaten for a while (fasting). You may have this done every 1-3 years.  Abdominal aortic aneurysm (AAA) screening. You may need this if you are a current or former smoker.  Osteoporosis. You may be screened starting at age 84 if you are at high risk. Talk with your health care provider about your test results, treatment options, and if necessary, the need for more tests. Vaccines  Your health care provider may recommend certain vaccines, such as:  Influenza vaccine.  This is recommended every year.  Tetanus, diphtheria, and acellular pertussis (Tdap, Td) vaccine. You may need a Td booster every 10 years.  Zoster vaccine. You may need this after age 67.  Pneumococcal 13-valent conjugate (PCV13) vaccine. One dose is recommended after age  71.  Pneumococcal polysaccharide (PPSV23) vaccine. One dose is recommended after age 86. Talk to your health care provider about which screenings and vaccines you need and how often you need them. This information is not intended to replace advice given to you by your health care provider. Make sure you discuss any questions you have with your health care provider. Document Released: 01/26/2015 Document Revised: 09/19/2015 Document Reviewed: 10/31/2014 Elsevier Interactive Patient Education  2017 [employer] Prevention in the Home Falls can cause injuries. They can happen to people of all ages. There are many things you can do to make your home safe and to help prevent falls. What can I do on the outside of my home?  Regularly fix the edges of walkways and driveways and fix any cracks.  Remove anything that might make you trip as you walk through a door, such as a raised step or threshold.  Trim any bushes or trees on the path to your home.  Use bright outdoor lighting.  Clear any walking paths of anything that might make someone trip, such as rocks or tools.  Regularly check to see if handrails are loose or broken. Make sure that both sides of any steps have handrails.  Any raised decks and porches should have guardrails on the edges.  Have any leaves, snow, or ice cleared regularly.  Use sand or salt on walking paths during winter.  Clean up any spills in your garage right away. This includes oil or grease spills. What can I do in the bathroom?  Use night lights.  Install grab bars by the toilet and in the tub and shower. Do not use towel bars as grab bars.  Use non-skid mats or decals in the tub or shower.  If you need to sit down in the shower, use a plastic, non-slip stool.  Keep the floor dry. Clean up any water that spills on the floor as soon as it happens.  Remove soap buildup in the tub or shower regularly.  Attach bath mats securely with double-sided  non-slip rug tape.  Do not have throw rugs and other things on the floor that can make you trip. What can I do in the bedroom?  Use night lights.  Make sure that you have a light by your bed that is easy to reach.  Do not use any sheets or blankets that are too big for your bed. They should not hang down onto the floor.  Have a firm chair that has side arms. You can use this for support while you get dressed.  Do not have throw rugs and other things on the floor that can make you trip. What can I do in the kitchen?  Clean up any spills right away.  Avoid walking on wet floors.  Keep items that you use a lot in easy-to-reach places.  If you need to reach something above you, use a strong step stool that has a grab bar.  Keep electrical cords out of the way.  Do not use floor polish or wax that makes floors slippery. If you must use wax, use non-skid floor wax.  Do not have throw rugs and other things on the floor that can make  you trip. What can I do with my stairs?  Do not leave any items on the stairs.  Make sure that there are handrails on both sides of the stairs and use them. Fix handrails that are broken or loose. Make sure that handrails are as long as the stairways.  Check any carpeting to make sure that it is firmly attached to the stairs. Fix any carpet that is loose or worn.  Avoid having throw rugs at the top or bottom of the stairs. If you do have throw rugs, attach them to the floor with carpet tape.  Make sure that you have a light switch at the top of the stairs and the bottom of the stairs. If you do not have them, ask someone to add them for you. What else can I do to help prevent falls?  Wear shoes that:  Do not have high heels.  Have rubber bottoms.  Are comfortable and fit you well.  Are closed at the toe. Do not wear sandals.  If you use a stepladder:  Make sure that it is fully opened. Do not climb a closed stepladder.  Make sure that both  sides of the stepladder are locked into place.  Ask someone to hold it for you, if possible.  Clearly mark and make sure that you can see:  Any grab bars or handrails.  First and last steps.  Where the edge of each step is.  Use tools that help you move around (mobility aids) if they are needed. These include:  Canes.  Walkers.  Scooters.  Crutches.  Turn on the lights when you go into a dark area. Replace any light bulbs as soon as they burn out.  Set up your furniture so you have a clear path. Avoid moving your furniture around.  If any of your floors are uneven, fix them.  If there are any pets around you, be aware of where they are.  Review your medicines with your doctor. Some medicines can make you feel dizzy. This can increase your chance of falling. Ask your doctor what other things that you can do to help prevent falls. This information is not intended to replace advice given to you by your health care provider. Make sure you discuss any questions you have with your health care provider. Document Released: 10/26/2008 Document Revised: 06/07/2015 Document Reviewed: 02/03/2014 Elsevier Interactive Patient Education  2017 [employer].

## 2019-01-04 NOTE — Progress Notes (Signed)
Subjective:   Curtis Gregory is a 68 y.o. male who presents for Medicare Annual/Subsequent preventive examination.  This visit is being conducted via phone call  - after an attmept to do on video chat - due to the COVID-19 pandemic. This patient has given me verbal consent via phone to conduct this visit, patient states they are participating from their home address. Some vital signs may be absent or patient reported.   Patient identification: identified by name, DOB, and current address.    Review of Systems:   Cardiac Risk Factors include: advanced age (>90men, >61 women);dyslipidemia;hypertension;obesity (BMI >30kg/m2)     Objective:    Vitals: There were no vitals taken for this visit.  There is no height or weight on file to calculate BMI.  Advanced Directives 01/04/2019 09/15/2017 09/11/2016 05/27/2014  Does Patient Have a Medical Advance Directive? Yes Yes Yes No  Type of Advance Directive Living will;Healthcare Power of Attorney Living will;Healthcare Power of [place] -  Does patient want to make changes to medical advance directive? - - No - Patient declined -  Copy of [place] in Chart? No - copy requested No - copy requested Yes -    Tobacco Social History   Tobacco Use  Smoking Status Never Smoker  Smokeless Tobacco Never Used     Counseling given: Not Answered   Clinical Intake:  Pre-visit preparation completed: Yes  Pain : No/denies pain     Nutritional Status: BMI > 30  Obese Nutritional Risks: None Diabetes: No  How often do you need to have someone help you when you read instructions, pamphlets, or other written materials from your doctor or pharmacy?: 1 - Never  Interpreter Needed?: No  Information entered by :: Lendy Dittrich,LPN  Past Medical History:  Diagnosis Date  . Asthma   . Benign prostatic hypertrophy with urinary frequency 06/16/2014  . Excessive urination at night 06/16/2014  . GERD  (gastroesophageal reflux disease)   . Glaucoma   . Hyperlipemia   . Sleep apnea    Past Surgical History:  Procedure Laterality Date  . COLONOSCOPY WITH PROPOFOL N/A 09/11/2016   Procedure: COLONOSCOPY WITH PROPOFOL;  Surgeon: Lucilla Lame, MD;  Location: [facility];  Service: Gastroenterology;  Laterality: N/A;  sleep apnea  . none    . POLYPECTOMY N/A 09/11/2016   Procedure: POLYPECTOMY;  Surgeon: Lucilla Lame, MD;  Location: [facility];  Service: Gastroenterology;  Laterality: N/A;   Family History  Problem Relation Age of Onset  . Diabetes Mother   . Heart disease Mother   . Alzheimer's disease Father   . Prostate cancer Father   . Prostate cancer Brother   . Kidney cancer Neg Hx    Social History   Socioeconomic History  . Marital status: Married    Spouse name: Not on file  . Number of children: Not on file  . Years of education: Not on file  . Highest education level: Master's degree (e.g., MA, MS, MEng, MEd, MSW, MBA)  Occupational History  . Occupation: retired  Tobacco Use  . Smoking status: Never Smoker  . Smokeless tobacco: Never Used  Substance and Sexual Activity  . Alcohol use: Yes    Alcohol/week: 1.0 standard drinks    Types: 1 Glasses of wine per week    Comment: occasional  . Drug use: No  . Sexual activity: Not Currently  Other Topics Concern  . Not on file  Social History Narrative  .  Not on file   Social Determinants of Health   Financial Resource Strain:   . Difficulty of Paying Living Expenses: Not on file  Food Insecurity:   . Worried About Charity fundraiser in the Last Year: Not on file  . Ran Out of Food in the Last Year: Not on file  Transportation Needs:   . Lack of Transportation (Medical): Not on file  . Lack of Transportation (Non-Medical): Not on file  Physical Activity:   . Days of Exercise per Week: Not on file  . Minutes of Exercise per Session: Not on file  Stress:   . Feeling of Stress : Not on file    Social Connections:   . Frequency of Communication with Friends and Family: Not on file  . Frequency of Social Gatherings with Friends and Family: Not on file  . Attends Religious Services: Not on file  . Active Member of Clubs or Organizations: Not on file  . Attends Archivist Meetings: Not on file  . Marital Status: Not on file    Outpatient Encounter Medications as of 01/04/2019  Medication Sig  . albuterol (PROVENTIL HFA;VENTOLIN HFA) 108 (90 BASE) MCG/ACT inhaler Inhale into the lungs. Reported on 06/04/2015  . fexofenadine (ALLEGRA ALLERGY) 180 MG tablet Take 1 tablet (180 mg total) by mouth daily.  . finasteride (PROSCAR) 5 MG tablet Take 1 tablet (5 mg total) by mouth daily.  . fluticasone (FLONASE) 50 MCG/ACT nasal spray Place 2 sprays into both nostrils daily. Use for 4-6 weeks then stop and use seasonally or as needed.  . hydrochlorothiazide (HYDRODIURIL) 12.5 MG tablet TAKE 1 TABLET BY MOUTH EVERY DAY  . hydrocortisone (ANUSOL-HC) 25 MG suppository Place 1 suppository (25 mg total) rectally 2 (two) times daily. For 7 days  . ipratropium (ATROVENT) 0.06 % nasal spray USE 2 SPRAYS IN EACH NOSTRIL FOUR TIMES DAILY FOR UP TO 5 TO 7 DAYS THEN STOP  . latanoprost (XALATAN) 0.005 % ophthalmic solution INSTILL ONE DROP IN BOTH EYES QHS.  . Multiple Vitamin (MULTIVITAMIN) tablet Take 1 tablet by mouth daily.  [name] Kitchen omeprazole (PRILOSEC) 20 MG capsule TAKE 1 TABLET BY MOUTH EVERY DAY  . OVER THE COUNTER MEDICATION Prime test testosterone booster  . rosuvastatin (CRESTOR) 20 MG tablet TAKE 1 TABLET(20 MG) BY MOUTH AT BEDTIME  . sildenafil (REVATIO) 20 MG tablet TAKE 2-5 TABLETS BY MOUTH 1 HOUR PRIOR TO INTERCOURSE  . tamsulosin (FLOMAX) 0.4 MG CAPS capsule Take 1 capsule (0.4 mg total) by mouth daily after breakfast.  . timolol (TIMOPTIC-XR) 0.5 % ophthalmic gel-forming INT 1 GTT INTO OS QD  . meloxicam (MOBIC) 15 MG tablet Take 1 tablet (15 mg total) by mouth daily as needed for  pain. (Patient not taking: Reported on 01/04/2019)   No facility-administered encounter medications on file as of 01/04/2019.    Activities of Daily Living In your present state of health, do you have any difficulty performing the following activities: 01/04/2019 01/03/2019  Hearing? N N  Vision? N N  Comment eyeglasses, goes to [facility] for vision screening -  Difficulty concentrating or making decisions? N N  Walking or climbing stairs? N N  Dressing or bathing? N N  Doing errands, shopping? N N  Preparing Food and eating ? N -  Using the Toilet? N -  In the past six months, have you accidently leaked urine? N -  Do you have problems with loss of bowel control? N -  Managing your Medications?  N -  Managing your Finances? N -  Housekeeping or managing your Housekeeping? N -  Some recent data might be hidden    Patient Care Team: Olin Hauser, DO as PCP - General (Family Medicine) Nickie Retort, MD as Consulting Physician (Urology)   Assessment:   This is a routine wellness examination for [place].  Exercise Activities and Dietary recommendations Current Exercise Habits: Home exercise routine, Type of exercise: strength training/weights(sit ups), Time (Minutes): 30, Frequency (Times/Week): 5, Weekly Exercise (Minutes/Week): 150, Intensity: Mild, Exercise limited by: None identified  Goals    . DIET - INCREASE WATER INTAKE     Recommend drinking at least 6-8 glasses of water a day        Fall Risk: Fall Risk  01/04/2019 01/03/2019 11/20/2017 09/18/2017 09/15/2017  Falls in the past year? 0 0 1 No No  Number falls in past yr: 0 0 - - -  Injury with Fall? 0 - - - -  Follow up - Falls evaluation completed Falls evaluation completed - -    FALL RISK PREVENTION PERTAINING TO THE HOME:  Any stairs in or around the home? No  If so, are there any without handrails? No   Home free of loose throw rugs in walkways, pet beds, electrical cords, etc? Yes    Adequate lighting in your home to reduce risk of falls? Yes   ASSISTIVE DEVICES UTILIZED TO PREVENT FALLS:  Life alert? No  Use of a cane, walker or w/c? No  Grab bars in the bathroom? No  Shower chair or bench in shower? No  Elevated toilet seat or a handicapped toilet? No   TIMED UP AND GO:  Unable yo perform   Depression Screen PHQ 2/9 Scores 01/04/2019 01/03/2019 11/20/2017 09/18/2017  PHQ - 2 Score 0 0 0 0    Cognitive Function     6CIT Screen 09/15/2017  What Year? 0 points  What month? 0 points  What time? 0 points  Count back from 20 0 points  Months in reverse 0 points  Repeat phrase 2 points  Total Score 2    Immunization History  Administered Date(s) Administered  . Fluad Quad(high Dose 65+) 09/08/2018  . Influenza, High Dose Seasonal PF 10/06/2016, 11/10/2017  . Influenza,inj,Quad PF,6+ Mos 10/21/2013, 10/18/2014, 11/08/2015  . Pneumococcal Conjugate-13 10/06/2016  . Pneumococcal Polysaccharide-23 09/08/2018  . Tdap 10/28/2014    Qualifies for Shingles Vaccine? Yes  Zostavax completed n/a. Due for Shingrix. Education has been provided regarding the importance of this vaccine. Pt has been advised to call insurance company to determine out of pocket expense. Advised may also receive vaccine at local pharmacy or Health Dept. Verbalized acceptance and understanding.  Tdap: up to date   Flu Vaccine: up to date   Pneumococcal Vaccine: up to date   Screening Tests Health Maintenance  Topic Date Due  . COLONOSCOPY  09/11/2021  . TETANUS/TDAP  10/27/2024  . INFLUENZA VACCINE  Completed  . Hepatitis C Screening  Completed  . PNA vac Low Risk Adult  Completed   Cancer Screenings:  Colorectal Screening: Completed 09/11/2016. Repeat every 5 years  Lung Cancer Screening: (Low Dose CT Chest recommended if Age 21-80 years, 30 pack-year currently smoking OR have quit w/in 15years.) does not qualify.    Additional Screening:  Hepatitis C Screening: does  qualify; Completed 05/22/2016  Vision Screening: Recommended annual ophthalmology exams for early detection of glaucoma and other disorders of the eye. Is the patient up to date  with their annual eye exam?  Yes  Who is the provider or what is the name of the office in which the pt attends annual eye exams? [facility] Screening: Recommended annual dental exams for proper oral hygiene  Community Resource Referral:  CRR required this visit?  No        Plan:  I have personally reviewed and addressed the Medicare Annual Wellness questionnaire and have noted the following in the patient's chart:  A. Medical and social history B. Use of alcohol, tobacco or illicit drugs  C. Current medications and supplements D. Functional ability and status E.  Nutritional status F.  Physical activity G. Advance directives H. List of other physicians I.  Hospitalizations, surgeries, and ER visits in previous 12 months J.  [place] such as hearing and vision if needed, cognitive and depression L. Referrals and appointments   In addition, I have reviewed and discussed with patient certain preventive protocols, quality metrics, and best practice recommendations. A written personalized care plan for preventive services as well as general preventive health recommendations were provided to patient.   Signed,   Bevelyn Ngo, LPN  [id number] Nurse Health Advisor   Nurse Notes: none

## 2019-02-11 ENCOUNTER — Other Ambulatory Visit: Payer: Self-pay | Admitting: Urology

## 2019-02-11 DIAGNOSIS — N401 Enlarged prostate with lower urinary tract symptoms: Secondary | ICD-10-CM

## 2019-02-11 DIAGNOSIS — R350 Frequency of micturition: Secondary | ICD-10-CM

## 2019-02-13 ENCOUNTER — Other Ambulatory Visit: Payer: Self-pay | Admitting: Family Medicine

## 2019-02-13 DIAGNOSIS — E7849 Other hyperlipidemia: Secondary | ICD-10-CM

## 2019-02-20 ENCOUNTER — Ambulatory Visit: Payer: Medicare Other | Attending: Internal Medicine

## 2019-02-20 DIAGNOSIS — Z23 Encounter for immunization: Secondary | ICD-10-CM | POA: Insufficient documentation

## 2019-02-20 NOTE — Progress Notes (Signed)
   Covid-19 Vaccination Clinic  Name:  Curtis Gregory    MRN: [id number] DOB: 03-20-1950  02/20/2019  [name] was observed post Covid-19 immunization for 15 minutes without incidence. He was provided with Vaccine Information Sheet and instruction to access the V-Safe system.   [name] was instructed to call 911 with any severe reactions post vaccine: [name] Kitchen Difficulty breathing  . Swelling of your face and throat  . A fast heartbeat  . A bad rash all over your body  . Dizziness and weakness    Immunizations Administered    Name Date Dose VIS Date Route   Pfizer COVID-19 Vaccine 02/20/2019  3:07 PM 0.3 mL 12/24/2018 Intramuscular   Manufacturer: [place]   Lot: [id number]   [place]: [id number]

## 2019-03-07 ENCOUNTER — Ambulatory Visit: Payer: 59

## 2019-03-08 ENCOUNTER — Other Ambulatory Visit: Payer: Self-pay | Admitting: Urology

## 2019-03-08 DIAGNOSIS — N401 Enlarged prostate with lower urinary tract symptoms: Secondary | ICD-10-CM

## 2019-03-09 ENCOUNTER — Encounter: Payer: Self-pay | Admitting: Urology

## 2019-03-09 ENCOUNTER — Ambulatory Visit (INDEPENDENT_AMBULATORY_CARE_PROVIDER_SITE_OTHER): Payer: Medicare Other | Admitting: Urology

## 2019-03-09 ENCOUNTER — Other Ambulatory Visit: Payer: Self-pay

## 2019-03-09 VITALS — BP 138/79 | HR 74 | Ht 68.0 in | Wt 221.0 lb

## 2019-03-09 DIAGNOSIS — N401 Enlarged prostate with lower urinary tract symptoms: Secondary | ICD-10-CM | POA: Diagnosis not present

## 2019-03-09 DIAGNOSIS — R350 Frequency of micturition: Secondary | ICD-10-CM

## 2019-03-09 NOTE — Progress Notes (Signed)
03/09/2019 10:27 AM   Curtis Gregory June 17, 1950 [id number]  Referring provider: Olin Hauser, DO [address] [place],  [place] [address]  Chief Complaint  Patient presents with  . Benign Prostatic Hypertrophy    Urologic history: 1.  BPH with lower urinary tract symptoms -Mild to moderate LUTS IPSS 10/35 -Combination therapy tamsulosin/finasteride  2.  Erectile dysfunction -Sildenafil   HPI: 69 y.o. male presents for annual follow-up.  He has stable lower urinary tract symptoms.  Denies dysuria, gross hematuria.  No flank, abdominal or pelvic pain.  Last PSA was August 2019 and was 0.3.   PMH: Past Medical History:  Diagnosis Date  . Asthma   . Benign prostatic hypertrophy with urinary frequency 06/16/2014  . Excessive urination at night 06/16/2014  . GERD (gastroesophageal reflux disease)   . Glaucoma   . Hyperlipemia   . Sleep apnea     Surgical History: Past Surgical History:  Procedure Laterality Date  . COLONOSCOPY WITH PROPOFOL N/A 09/11/2016   Procedure: COLONOSCOPY WITH PROPOFOL;  Surgeon: Lucilla Lame, MD;  Location: [facility];  Service: Gastroenterology;  Laterality: N/A;  sleep apnea  . none    . POLYPECTOMY N/A 09/11/2016   Procedure: POLYPECTOMY;  Surgeon: Lucilla Lame, MD;  Location: [facility];  Service: Gastroenterology;  Laterality: N/A;    Home Medications:  Allergies as of 03/09/2019   No Known Allergies     Medication List       Accurate as of March 09, 2019 10:27 AM. If you have any questions, ask your nurse or doctor.        albuterol 108 (90 Base) MCG/ACT inhaler Commonly known as: VENTOLIN HFA Inhale into the lungs. Reported on 06/04/2015   fexofenadine 180 MG tablet Commonly known as: Allegra Allergy Take 1 tablet (180 mg total) by mouth daily.   finasteride 5 MG tablet Commonly known as: PROSCAR TAKE 1 TABLET BY MOUTH EVERY DAY   fluticasone 50 MCG/ACT nasal spray Commonly known as: FLONASE Place  2 sprays into both nostrils daily. Use for 4-6 weeks then stop and use seasonally or as needed.   hydrochlorothiazide 12.5 MG tablet Commonly known as: HYDRODIURIL TAKE 1 TABLET BY MOUTH EVERY DAY   hydrocortisone 25 MG suppository Commonly known as: ANUSOL-HC Place 1 suppository (25 mg total) rectally 2 (two) times daily. For 7 days   ipratropium 0.06 % nasal spray Commonly known as: ATROVENT USE 2 SPRAYS IN EACH NOSTRIL FOUR TIMES DAILY FOR UP TO 5 TO 7 DAYS THEN STOP   latanoprost 0.005 % ophthalmic solution Commonly known as: XALATAN INSTILL ONE DROP IN BOTH EYES QHS.   meloxicam 15 MG tablet Commonly known as: MOBIC Take 1 tablet (15 mg total) by mouth daily as needed for pain.   multivitamin tablet Take 1 tablet by mouth daily.   omeprazole 20 MG capsule Commonly known as: PRILOSEC TAKE 1 TABLET BY MOUTH EVERY DAY   OVER THE COUNTER MEDICATION Prime test testosterone booster   rosuvastatin 20 MG tablet Commonly known as: CRESTOR TAKE 1 TABLET BY MOUTH EVERYDAY AT BEDTIME   sildenafil 20 MG tablet Commonly known as: REVATIO TAKE 2-5 TABLETS BY MOUTH 1 HOUR PRIOR TO INTERCOURSE   tamsulosin 0.4 MG Caps capsule Commonly known as: FLOMAX Take 1 capsule (0.4 mg total) by mouth daily after breakfast.   timolol 0.5 % ophthalmic gel-forming Commonly known as: TIMOPTIC-XR INT 1 GTT INTO OS QD       Allergies: No Known Allergies  Family History: Family History  Problem Relation Age of Onset  . Diabetes Mother   . Heart disease Mother   . Alzheimer's disease Father   . Prostate cancer Father   . Prostate cancer Brother   . Kidney cancer Neg Hx     Social History:  reports that he has never smoked. He has never used smokeless tobacco. He reports current alcohol use of about 1.0 standard drinks of alcohol per week. He reports that he does not use drugs.   Physical Exam: BP 138/79   Pulse 74   Ht 5\' 8"  (1.727 m)   Wt 221 lb (100.2 kg)   BMI 33.60 kg/m    Constitutional:  Alert and oriented, No acute distress. HEENT: [place] AT, moist mucus membranes.  Trachea midline, no masses. Cardiovascular: No clubbing, cyanosis, or edema. Respiratory: Normal respiratory effort, no increased work of breathing. GU: Prostate 40 g, smooth without nodules Skin: No rashes, bruises or suspicious lesions. Neurologic: Grossly intact, no focal deficits, moving all 4 extremities. Psychiatric: Normal mood and affect.   Assessment & Plan:    - BPH with lower urinary tract symptoms Stable LUTS on combination therapy.  Tamsulosin and finasteride were refilled.  Continue annual follow-up.  - Prostate cancer screening Benign DRE.  Last PSA 08/2017.  He desires to continue PSA testing.  Continue annual follow-up   Abbie Sons, MD  [facility] [address] [place], [place] [address] [phone]

## 2019-03-10 ENCOUNTER — Encounter: Payer: Self-pay | Admitting: Urology

## 2019-03-10 LAB — PSA: Prostate Specific Ag, Serum: 0.3 ng/mL (ref 0.0–4.0)

## 2019-03-10 MED ORDER — TAMSULOSIN HCL 0.4 MG PO CAPS: 0.4000 mg | ORAL_CAPSULE | Freq: Every day | ORAL | 3 refills | Status: DC

## 2019-03-10 MED ORDER — FINASTERIDE 5 MG PO TABS: 5.0000 mg | ORAL_TABLET | Freq: Every day | ORAL | 3 refills | Status: DC

## 2019-03-14 ENCOUNTER — Telehealth: Payer: Self-pay | Admitting: *Deleted

## 2019-03-14 NOTE — Telephone Encounter (Signed)
Left message on phone

## 2019-03-14 NOTE — Telephone Encounter (Signed)
-----   Message from Abbie Sons, MD sent at 03/13/2019  1:18 PM EST ----- PSA stable at 0.3

## 2019-03-15 ENCOUNTER — Ambulatory Visit: Payer: Self-pay | Admitting: Surgery

## 2019-03-15 ENCOUNTER — Ambulatory Visit (INDEPENDENT_AMBULATORY_CARE_PROVIDER_SITE_OTHER): Payer: Medicare Other | Admitting: Surgery

## 2019-03-15 ENCOUNTER — Other Ambulatory Visit: Payer: Self-pay

## 2019-03-15 ENCOUNTER — Encounter: Payer: Self-pay | Admitting: Family Medicine

## 2019-03-15 ENCOUNTER — Ambulatory Visit (INDEPENDENT_AMBULATORY_CARE_PROVIDER_SITE_OTHER): Payer: Medicare Other | Admitting: Family Medicine

## 2019-03-15 ENCOUNTER — Encounter: Payer: Self-pay | Admitting: Surgery

## 2019-03-15 VITALS — BP 150/85 | HR 64 | Temp 96.6°F | Resp 12 | Ht 68.0 in | Wt 220.4 lb

## 2019-03-15 DIAGNOSIS — K648 Other hemorrhoids: Secondary | ICD-10-CM

## 2019-03-15 DIAGNOSIS — K645 Perianal venous thrombosis: Secondary | ICD-10-CM

## 2019-03-15 HISTORY — DX: Perianal venous thrombosis: K64.5

## 2019-03-15 HISTORY — DX: Perianal venous thrombosis: K64.8

## 2019-03-15 MED ORDER — HYDROCORTISONE ACETATE 25 MG RE SUPP: 25.0000 mg | Freq: Two times a day (BID) | RECTAL | 2 refills | Status: DC

## 2019-03-15 NOTE — Patient Instructions (Addendum)
Our surgery scheduler will get in contact with you to schedule your surgery. Please have the Blue sheet available when she calls you.   Continue with the suppositories and start doing sitz baths.   Please call the office if you have any questions or concerns.   Hemorrhoids Hemorrhoids are swollen veins in and around the rectum or anus. There are two types of hemorrhoids:  Internal hemorrhoids. These occur in the veins that are just inside the rectum. They may poke through to the outside and become irritated and painful.  External hemorrhoids. These occur in the veins that are outside the anus and can be felt as a painful swelling or hard lump near the anus. Most hemorrhoids do not cause serious problems, and they can be managed with home treatments such as diet and lifestyle changes. If home treatments do not help the symptoms, procedures can be done to shrink or remove the hemorrhoids. What are the causes? This condition is caused by increased pressure in the anal area. This pressure may result from various things, including:  Constipation.  Straining to have a bowel movement.  Diarrhea.  Pregnancy.  Obesity.  Sitting for long periods of time.  Heavy lifting or other activity that causes you to strain.  Anal sex.  Riding a bike for a long period of time. What are the signs or symptoms? Symptoms of this condition include:  Pain.  Anal itching or irritation.  Rectal bleeding.  Leakage of stool (feces).  Anal swelling.  One or more lumps around the anus. How is this diagnosed? This condition can often be diagnosed through a visual exam. Other exams or tests may also be done, such as:  An exam that involves feeling the rectal area with a gloved hand (digital rectal exam).  An exam of the anal canal that is done using a small tube (anoscope).  A blood test, if you have lost a significant amount of blood.  A test to look inside the colon using a flexible tube with a  camera on the end (sigmoidoscopy or colonoscopy). How is this treated? This condition can usually be treated at home. However, various procedures may be done if dietary changes, lifestyle changes, and other home treatments do not help your symptoms. These procedures can help make the hemorrhoids smaller or remove them completely. Some of these procedures involve surgery, and others do not. Common procedures include:  Rubber band ligation. Rubber bands are placed at the base of the hemorrhoids to cut off their blood supply.  Sclerotherapy. Medicine is injected into the hemorrhoids to shrink them.  Infrared coagulation. A type of light energy is used to get rid of the hemorrhoids.  Hemorrhoidectomy surgery. The hemorrhoids are surgically removed, and the veins that supply them are tied off.  Stapled hemorrhoidopexy surgery. The surgeon staples the base of the hemorrhoid to the rectal wall. Follow these instructions at home: Eating and drinking   Eat foods that have a lot of fiber in them, such as whole grains, beans, nuts, fruits, and vegetables.  Ask your health care provider about taking products that have added fiber (fiber supplements).  Reduce the amount of fat in your diet. You can do this by eating low-fat dairy products, eating less red meat, and avoiding processed foods.  Drink enough fluid to keep your urine pale yellow. Managing pain and swelling   Take warm sitz baths for 20 minutes, 3-4 times a day to ease pain and discomfort. You may do this in a  bathtub or using a portable sitz bath that fits over the toilet.  If directed, apply ice to the affected area. Using ice packs between sitz baths may be helpful. ? Put ice in a plastic bag. ? Place a towel between your skin and the bag. ? Leave the ice on for 20 minutes, 2-3 times a day. General instructions  Take over-the-counter and prescription medicines only as told by your health care provider.  Use medicated creams or  suppositories as told.  Get regular exercise. Ask your health care provider how much and what kind of exercise is best for you. In general, you should do moderate exercise for at least 30 minutes on most days of the week (150 minutes each week). This can include activities such as walking, biking, or yoga.  Go to the bathroom when you have the urge to have a bowel movement. Do not wait.  Avoid straining to have bowel movements.  Keep the anal area dry and clean. Use wet toilet paper or moist towelettes after a bowel movement.  Do not sit on the toilet for long periods of time. This increases blood pooling and pain.  Keep all follow-up visits as told by your health care provider. This is important. Contact a health care provider if you have:  Increasing pain and swelling that are not controlled by treatment or medicine.  Difficulty having a bowel movement, or you are unable to have a bowel movement.  Pain or inflammation outside the area of the hemorrhoids. Get help right away if you have:  Uncontrolled bleeding from your rectum. Summary  Hemorrhoids are swollen veins in and around the rectum or anus.  Most hemorrhoids can be managed with home treatments such as diet and lifestyle changes.  Taking warm sitz baths can help ease pain and discomfort.  In severe cases, procedures or surgery can be done to shrink or remove the hemorrhoids. This information is not intended to replace advice given to you by your health care provider. Make sure you discuss any questions you have with your health care provider. Document Revised: 05/28/2018 Document Reviewed: 05/21/2017 Elsevier Patient Education  [place].

## 2019-03-15 NOTE — Patient Instructions (Addendum)
Thank you for coming to the office today.  Re ordered Suppository twice a day for 7 days with refill  You have an inflamed external hemorrhoid, which involves swollen veins on your rectum, it is very sensitive and causes your severe pain. You may experience worsening pain and bleeding with bright red blood if the hemorrhoid develops a superficial blood clot. Also you may have deeper internal hemorrhoids that can cause bleeding without as much pain. - Start using the Anusol suppository twice a day as prescribed for 1 week, given a refill if needed for flare up - continue the warm bathtub soak 1-2 times daily for next week if you can, or can try the [facility] for just your bottom - Try to stay well hydrated, avoid constipation and straining, eat a high fiber diet  We are calling for surgical apt, will call you back later  Please schedule a Follow-up Appointment to: Return if symptoms worsen or fail to improve, for hemorrhoid.  If you have any other questions or concerns, please feel free to call the office or send a message through [place]. You may also schedule an earlier appointment if necessary.  Additionally, you may be receiving a survey about your experience at our office within a few days to 1 week by e-mail or mail. We value your feedback.  [name], DO [place]

## 2019-03-15 NOTE — Progress Notes (Signed)
Patient ID: Curtis Gregory, male   DOB: April 07, 1950, 69 y.o.   MRN: [id number]  Chief Complaint: External hemorrhoids  History of Present Illness Curtis Gregory is a 69 y.o. male with external hemorrhoids.  Referred by PCP today.  Patient reports normal bowel movements, averaging 1 daily.  When asked how much effort is required during bowel activity he reports that when he is dehydrated they are more difficult to move.  He denies bleeding.  He reports a week worth of increased swelling and pain.  He has utilized hydrocortisone suppositories and is seen in 2019 for the same.  He denies itching.  He does not take anything to supplement, i.e. fiber or any other laxatives.  He reports his last colonoscopy was about 6 months ago.  Past Medical History Past Medical History:  Diagnosis Date  . Asthma   . Benign prostatic hypertrophy with urinary frequency 06/16/2014  . Excessive urination at night 06/16/2014  . GERD (gastroesophageal reflux disease)   . Glaucoma   . Hyperlipemia   . Sleep apnea       Past Surgical History:  Procedure Laterality Date  . COLONOSCOPY WITH PROPOFOL N/A 09/11/2016   Procedure: COLONOSCOPY WITH PROPOFOL;  Surgeon: Lucilla Lame, MD;  Location: [facility];  Service: Gastroenterology;  Laterality: N/A;  sleep apnea  . none    . POLYPECTOMY N/A 09/11/2016   Procedure: POLYPECTOMY;  Surgeon: Lucilla Lame, MD;  Location: [place];  Service: Gastroenterology;  Laterality: N/A;    No Known Allergies  Current Outpatient Medications  Medication Sig Dispense Refill  . albuterol (PROVENTIL HFA;VENTOLIN HFA) 108 (90 BASE) MCG/ACT inhaler Inhale into the lungs. Reported on 06/04/2015    . fexofenadine (ALLEGRA ALLERGY) 180 MG tablet Take 1 tablet (180 mg total) by mouth daily. 90 tablet 3  . finasteride (PROSCAR) 5 MG tablet Take 1 tablet (5 mg total) by mouth daily. 90 tablet 3  . fluticasone (FLONASE) 50 MCG/ACT nasal spray Place 2 sprays into both nostrils daily.  Use for 4-6 weeks then stop and use seasonally or as needed. 16 g 3  . hydrochlorothiazide (HYDRODIURIL) 12.5 MG tablet TAKE 1 TABLET BY MOUTH EVERY DAY 90 tablet 1  . hydrocortisone (ANUSOL-HC) 25 MG suppository Place 1 suppository (25 mg total) rectally 2 (two) times daily. For 7 days 14 suppository 2  . ipratropium (ATROVENT) 0.06 % nasal spray USE 2 SPRAYS IN EACH NOSTRIL FOUR TIMES DAILY FOR UP TO 5 TO 7 DAYS THEN STOP 15 mL 0  . latanoprost (XALATAN) 0.005 % ophthalmic solution INSTILL ONE DROP IN BOTH EYES QHS.  0  . meloxicam (MOBIC) 15 MG tablet Take 1 tablet (15 mg total) by mouth daily as needed for pain. 30 tablet 2  . Multiple Vitamin (MULTIVITAMIN) tablet Take 1 tablet by mouth daily.    [name] Kitchen omeprazole (PRILOSEC) 20 MG capsule TAKE 1 TABLET BY MOUTH EVERY DAY 90 capsule 1  . rosuvastatin (CRESTOR) 20 MG tablet TAKE 1 TABLET BY MOUTH EVERYDAY AT BEDTIME 90 tablet 1  . sildenafil (REVATIO) 20 MG tablet TAKE 2-5 TABLETS BY MOUTH 1 HOUR PRIOR TO INTERCOURSE 30 tablet 2  . tamsulosin (FLOMAX) 0.4 MG CAPS capsule Take 1 capsule (0.4 mg total) by mouth daily after breakfast. 90 capsule 3  . timolol (TIMOPTIC-XR) 0.5 % ophthalmic gel-forming INT 1 GTT INTO OS QD  0  . OVER THE COUNTER MEDICATION Prime test testosterone booster     No current facility-administered medications for this visit.  Family History Family History  Problem Relation Age of Onset  . Diabetes Mother   . Heart disease Mother   . Alzheimer's disease Father   . Prostate cancer Father   . Prostate cancer Brother   . Kidney cancer Neg Hx       Social History Social History   Tobacco Use  . Smoking status: Never Smoker  . Smokeless tobacco: Never Used  Substance Use Topics  . Alcohol use: Yes    Alcohol/week: 1.0 standard drinks    Types: 1 Glasses of wine per week    Comment: occasional  . Drug use: No        Review of Systems  Constitutional: Negative for chills and fever.  HENT: Negative.    Eyes: Negative.   Respiratory: Negative.   Cardiovascular: Negative.   Gastrointestinal: Negative for abdominal pain, blood in stool, constipation, diarrhea, nausea and vomiting.  Genitourinary: Negative for dysuria, frequency and urgency.  Musculoskeletal: Negative.   Skin: Negative.   Neurological: Negative.   Endo/Heme/Allergies: Negative.       Physical Exam Blood pressure (!) 150/85, pulse 64, temperature (!) 96.6 F (35.9 C), resp. rate 12, height 5\' 8"  (1.727 m), weight 220 lb 6.4 oz (100 kg), SpO2 98 %. Last Weight  Most recent update: 03/15/2019  2:59 PM   Weight  100 kg (220 lb 6.4 oz)            CONSTITUTIONAL: Well developed, and nourished, appropriately responsive and aware without distress.   EYES: Sclera non-icteric.   EARS, NOSE, MOUTH AND THROAT: Mask worn.   Hearing is intact to voice.  NECK: Trachea is midline, and there is no jugular venous distension.  LYMPH NODES:  Lymph nodes in the neck are not enlarged. RESPIRATORY:  Lungs are clear, and breath sounds are equal bilaterally. Normal respiratory effort without pathologic use of accessory muscles. CARDIOVASCULAR: Heart is regular in rate and rhythm. GI: The abdomen is soft, nontender, and nondistended.  GU: One third of the anal circumference, is external hemorrhoid with edema noted on the patient's right.  There are punctate areas of black/clot discoloration with ulceration which reveals the duration of thrombosis.  He is quite tender, however I was able to palpate an internal cord proximal to this external hemorrhoidal complex.  That is the limitation of the exam.  I do not appreciate any external hemorrhoids other than this. MUSCULOSKELETAL:  Symmetrical muscle tone appreciated in all four extremities.    SKIN: Skin turgor is normal. No pathologic skin lesions appreciated.  NEUROLOGIC:  Motor and sensation appear grossly normal.  Cranial nerves are grossly without defect. PSYCH:  Alert and oriented to person,  place and time. Affect is appropriate for situation.  Data Reviewed I have personally reviewed what is currently available of the patient's imaging, recent labs and medical records.   Labs:  CBC Latest Ref Rng & Units 09/08/2017 05/22/2016  WBC 3.8 - 10.8 Thousand/uL 12.2(H) 4.1  Hemoglobin 13.2 - 17.1 g/dL 15.1 14.7  Hematocrit 38.5 - 50.0 % 45.0 44.4  Platelets 140 - 400 Thousand/uL 306 225   CMP Latest Ref Rng & Units 09/08/2017 05/22/2016  Glucose 65 - 99 mg/dL 101(H) 96  BUN 7 - 25 mg/dL 19 17  Creatinine 0.70 - 1.25 mg/dL 1.08 1.15  Sodium 135 - 146 mmol/L 139 141  Potassium 3.5 - 5.3 mmol/L 4.1 4.4  Chloride 98 - 110 mmol/L 102 107  CO2 20 - 32 mmol/L 29 25  Calcium 8.6 -  10.3 mg/dL 8.7 8.3(L)  Total Protein 6.1 - 8.1 g/dL 6.9 6.2  Total Bilirubin 0.2 - 1.2 mg/dL 0.6 0.7  Alkaline Phos 40 - 115 U/L - 53  AST 10 - 35 U/L 15 21  ALT 9 - 46 U/L 17 13      Imaging:  Within last 24 hrs: No results found.  Assessment    Mixed internal and external hemorrhoids, with external hemorrhoidal thrombosis. Patient Active Problem List   Diagnosis Date Noted  . Chronic pain of right knee 09/08/2018  . Elevated hemoglobin A1c 09/09/2017  . Vitamin D deficiency 09/09/2017  . Benign neoplasm of descending colon   . Seasonal allergies 04/30/2015  . Snoring 10/18/2014  . Torticollis, acute 10/18/2014  . Asthma, mild intermittent 06/16/2014  . Hyperlipidemia 06/16/2014  . Essential hypertension 06/16/2014  . GERD (gastroesophageal reflux disease) 06/16/2014  . Pain in the wrist 06/16/2014  . Apnea, sleep 06/16/2014  . Benign prostatic hyperplasia with urinary frequency 06/16/2014    Plan    Internal and external hemorrhoidectomy with 2 columns or more.  Options of proceeding with local excision/enucleation today were discussed but felt to be unhelpful in terms of resolving his pain.  I believe he is already been through the worst of the pain secondary to his external thrombosis  and desires to continue utilizing topical agents and sitz bath's.  We discussed sitz bath's in detail.  We will set up his surgery, and he will discuss proceeding with his spouse.  Face-to-face time spent with the patient and accompanying care providers(if present) was 30 minutes, with more than 50% of the time spent counseling, educating, and coordinating care of the patient.      Ronny Bacon M.D., FACS 03/15/2019, 3:55 PM

## 2019-03-15 NOTE — H&P (View-Only) (Signed)
Patient ID: Curtis Gregory, male   DOB: Oct 18, 1950, 69 y.o.   MRN: [id number]  Chief Complaint: External hemorrhoids  History of Present Illness Curtis Gregory is a 69 y.o. male with external hemorrhoids.  Referred by PCP today.  Patient reports normal bowel movements, averaging 1 daily.  When asked how much effort is required during bowel activity he reports that when he is dehydrated they are more difficult to move.  He denies bleeding.  He reports a week worth of increased swelling and pain.  He has utilized hydrocortisone suppositories and is seen in 2019 for the same.  He denies itching.  He does not take anything to supplement, i.e. fiber or any other laxatives.  He reports his last colonoscopy was about 6 months ago.  Past Medical History Past Medical History:  Diagnosis Date  . Asthma   . Benign prostatic hypertrophy with urinary frequency 06/16/2014  . Excessive urination at night 06/16/2014  . GERD (gastroesophageal reflux disease)   . Glaucoma   . Hyperlipemia   . Sleep apnea       Past Surgical History:  Procedure Laterality Date  . COLONOSCOPY WITH PROPOFOL N/A 09/11/2016   Procedure: COLONOSCOPY WITH PROPOFOL;  Surgeon: Lucilla Lame, MD;  Location: [facility];  Service: Gastroenterology;  Laterality: N/A;  sleep apnea  . none    . POLYPECTOMY N/A 09/11/2016   Procedure: POLYPECTOMY;  Surgeon: Lucilla Lame, MD;  Location: [facility];  Service: Gastroenterology;  Laterality: N/A;    No Known Allergies  Current Outpatient Medications  Medication Sig Dispense Refill  . albuterol (PROVENTIL HFA;VENTOLIN HFA) 108 (90 BASE) MCG/ACT inhaler Inhale into the lungs. Reported on 06/04/2015    . fexofenadine (ALLEGRA ALLERGY) 180 MG tablet Take 1 tablet (180 mg total) by mouth daily. 90 tablet 3  . finasteride (PROSCAR) 5 MG tablet Take 1 tablet (5 mg total) by mouth daily. 90 tablet 3  . fluticasone (FLONASE) 50 MCG/ACT nasal spray Place 2 sprays into both nostrils daily.  Use for 4-6 weeks then stop and use seasonally or as needed. 16 g 3  . hydrochlorothiazide (HYDRODIURIL) 12.5 MG tablet TAKE 1 TABLET BY MOUTH EVERY DAY 90 tablet 1  . hydrocortisone (ANUSOL-HC) 25 MG suppository Place 1 suppository (25 mg total) rectally 2 (two) times daily. For 7 days 14 suppository 2  . ipratropium (ATROVENT) 0.06 % nasal spray USE 2 SPRAYS IN EACH NOSTRIL FOUR TIMES DAILY FOR UP TO 5 TO 7 DAYS THEN STOP 15 mL 0  . latanoprost (XALATAN) 0.005 % ophthalmic solution INSTILL ONE DROP IN BOTH EYES QHS.  0  . meloxicam (MOBIC) 15 MG tablet Take 1 tablet (15 mg total) by mouth daily as needed for pain. 30 tablet 2  . Multiple Vitamin (MULTIVITAMIN) tablet Take 1 tablet by mouth daily.    [name] Kitchen omeprazole (PRILOSEC) 20 MG capsule TAKE 1 TABLET BY MOUTH EVERY DAY 90 capsule 1  . rosuvastatin (CRESTOR) 20 MG tablet TAKE 1 TABLET BY MOUTH EVERYDAY AT BEDTIME 90 tablet 1  . sildenafil (REVATIO) 20 MG tablet TAKE 2-5 TABLETS BY MOUTH 1 HOUR PRIOR TO INTERCOURSE 30 tablet 2  . tamsulosin (FLOMAX) 0.4 MG CAPS capsule Take 1 capsule (0.4 mg total) by mouth daily after breakfast. 90 capsule 3  . timolol (TIMOPTIC-XR) 0.5 % ophthalmic gel-forming INT 1 GTT INTO OS QD  0  . OVER THE COUNTER MEDICATION Prime test testosterone booster     No current facility-administered medications for this visit.  Family History Family History  Problem Relation Age of Onset  . Diabetes Mother   . Heart disease Mother   . Alzheimer's disease Father   . Prostate cancer Father   . Prostate cancer Brother   . Kidney cancer Neg Hx       Social History Social History   Tobacco Use  . Smoking status: Never Smoker  . Smokeless tobacco: Never Used  Substance Use Topics  . Alcohol use: Yes    Alcohol/week: 1.0 standard drinks    Types: 1 Glasses of wine per week    Comment: occasional  . Drug use: No        Review of Systems  Constitutional: Negative for chills and fever.  HENT: Negative.    Eyes: Negative.   Respiratory: Negative.   Cardiovascular: Negative.   Gastrointestinal: Negative for abdominal pain, blood in stool, constipation, diarrhea, nausea and vomiting.  Genitourinary: Negative for dysuria, frequency and urgency.  Musculoskeletal: Negative.   Skin: Negative.   Neurological: Negative.   Endo/Heme/Allergies: Negative.       Physical Exam Blood pressure (!) 150/85, pulse 64, temperature (!) 96.6 F (35.9 C), resp. rate 12, height 5\' 8"  (1.727 m), weight 220 lb 6.4 oz (100 kg), SpO2 98 %. Last Weight  Most recent update: 03/15/2019  2:59 PM   Weight  100 kg (220 lb 6.4 oz)            CONSTITUTIONAL: Well developed, and nourished, appropriately responsive and aware without distress.   EYES: Sclera non-icteric.   EARS, NOSE, MOUTH AND THROAT: Mask worn.   Hearing is intact to voice.  NECK: Trachea is midline, and there is no jugular venous distension.  LYMPH NODES:  Lymph nodes in the neck are not enlarged. RESPIRATORY:  Lungs are clear, and breath sounds are equal bilaterally. Normal respiratory effort without pathologic use of accessory muscles. CARDIOVASCULAR: Heart is regular in rate and rhythm. GI: The abdomen is soft, nontender, and nondistended.  GU: One third of the anal circumference, is external hemorrhoid with edema noted on the patient's right.  There are punctate areas of black/clot discoloration with ulceration which reveals the duration of thrombosis.  He is quite tender, however I was able to palpate an internal cord proximal to this external hemorrhoidal complex.  That is the limitation of the exam.  I do not appreciate any external hemorrhoids other than this. MUSCULOSKELETAL:  Symmetrical muscle tone appreciated in all four extremities.    SKIN: Skin turgor is normal. No pathologic skin lesions appreciated.  NEUROLOGIC:  Motor and sensation appear grossly normal.  Cranial nerves are grossly without defect. PSYCH:  Alert and oriented to person,  place and time. Affect is appropriate for situation.  Data Reviewed I have personally reviewed what is currently available of the patient's imaging, recent labs and medical records.   Labs:  CBC Latest Ref Rng & Units 09/08/2017 05/22/2016  WBC 3.8 - 10.8 Thousand/uL 12.2(H) 4.1  Hemoglobin 13.2 - 17.1 g/dL 15.1 14.7  Hematocrit 38.5 - 50.0 % 45.0 44.4  Platelets 140 - 400 Thousand/uL 306 225   CMP Latest Ref Rng & Units 09/08/2017 05/22/2016  Glucose 65 - 99 mg/dL 101(H) 96  BUN 7 - 25 mg/dL 19 17  Creatinine 0.70 - 1.25 mg/dL 1.08 1.15  Sodium 135 - 146 mmol/L 139 141  Potassium 3.5 - 5.3 mmol/L 4.1 4.4  Chloride 98 - 110 mmol/L 102 107  CO2 20 - 32 mmol/L 29 25  Calcium 8.6 -  10.3 mg/dL 8.7 8.3(L)  Total Protein 6.1 - 8.1 g/dL 6.9 6.2  Total Bilirubin 0.2 - 1.2 mg/dL 0.6 0.7  Alkaline Phos 40 - 115 U/L - 53  AST 10 - 35 U/L 15 21  ALT 9 - 46 U/L 17 13      Imaging:  Within last 24 hrs: No results found.  Assessment    Mixed internal and external hemorrhoids, with external hemorrhoidal thrombosis. Patient Active Problem List   Diagnosis Date Noted  . Chronic pain of right knee 09/08/2018  . Elevated hemoglobin A1c 09/09/2017  . Vitamin D deficiency 09/09/2017  . Benign neoplasm of descending colon   . Seasonal allergies 04/30/2015  . Snoring 10/18/2014  . Torticollis, acute 10/18/2014  . Asthma, mild intermittent 06/16/2014  . Hyperlipidemia 06/16/2014  . Essential hypertension 06/16/2014  . GERD (gastroesophageal reflux disease) 06/16/2014  . Pain in the wrist 06/16/2014  . Apnea, sleep 06/16/2014  . Benign prostatic hyperplasia with urinary frequency 06/16/2014    Plan    Internal and external hemorrhoidectomy with 2 columns or more.  Options of proceeding with local excision/enucleation today were discussed but felt to be unhelpful in terms of resolving his pain.  I believe he is already been through the worst of the pain secondary to his external thrombosis  and desires to continue utilizing topical agents and sitz bath's.  We discussed sitz bath's in detail.  We will set up his surgery, and he will discuss proceeding with his spouse.  Face-to-face time spent with the patient and accompanying care providers(if present) was 30 minutes, with more than 50% of the time spent counseling, educating, and coordinating care of the patient.      Ronny Bacon M.D., FACS 03/15/2019, 3:55 PM

## 2019-03-15 NOTE — Progress Notes (Addendum)
Subjective:    Patient ID: Curtis Gregory, male    DOB: 04-24-1950, 69 y.o.   MRN: [id number]  Curtis Gregory is a 69 y.o. male presenting on 03/15/2019 for Hemorrhoids (onset 3 days)   HPI   FOLLOW-UP Hemorrhoids, External , Suspected Thrombosed - Last visit with me for this same issue was 06/2017, he was referred to gen surgery that same day for acute lance of thrombosed hemorrhoid ([facility] Dr Burt Knack 06/26/17), see prior notes for background information. - Interval update with with flare up hemorrhoids q 6 month for minor flare ups that usually resolve with topical cream within 24 hours. - Today patient reports recent acute flare x 3 days now with swollen painful hemorrhoid, no bleeding. Similar to last time. - Tried Proctozone Cream, wife's rx, used for past 3 days, limited relief. - he request refill on Anusol suppository used those before with some relief - Denies significant constipation straining. He did say onset seemed to be following prostate exam.   Depression screen [facility] 2/9 03/15/2019 01/04/2019 01/03/2019  Decreased Interest 0 0 0  Down, Depressed, Hopeless 0 0 0  PHQ - 2 Score 0 0 0    Social History   Tobacco Use  . Smoking status: Never Smoker  . Smokeless tobacco: Never Used  Substance Use Topics  . Alcohol use: Yes    Alcohol/week: 1.0 standard drinks    Types: 1 Glasses of wine per week    Comment: occasional  . Drug use: No    Review of Systems Per HPI unless specifically indicated above     Objective:    BP 124/69   Pulse 68   Temp (!) 97.1 F (36.2 C) (Temporal)   Resp 16   Ht 5\' 8"  (1.727 m)   Wt 221 lb 9.6 oz (100.5 kg)   BMI 33.69 kg/m   Wt Readings from Last 3 Encounters:  03/15/19 221 lb 9.6 oz (100.5 kg)  03/09/19 221 lb (100.2 kg)  01/04/19 221 lb (100.2 kg)    Physical Exam Vitals and nursing note reviewed.  Constitutional:      General: He is not in acute distress.    Appearance: He is well-developed. He is not  diaphoretic.     Comments: Well-appearing, comfortable, cooperative  HENT:     Head: Normocephalic and atraumatic.  Eyes:     General:        Right eye: No discharge.        Left eye: No discharge.     Conjunctiva/sclera: Conjunctivae normal.  Cardiovascular:     Rate and Rhythm: Normal rate.  Pulmonary:     Effort: Pulmonary effort is normal.  Genitourinary:    Comments: Rectal: External rectal exam with moderately to large sized engorged swollen slightly firm tender hemorrhoidal tissue appears external R sided. No bleeding. Skin:    General: Skin is warm and dry.     Findings: No erythema or rash.  Neurological:     Mental Status: He is alert and oriented to person, place, and time.  Psychiatric:        Behavior: Behavior normal.     Comments: Well groomed, good eye contact, normal speech and thoughts    Results for orders placed or performed in visit on 03/09/19  PSA  Result Value Ref Range   Prostate Specific Ag, Serum 0.3 0.0 - 4.0 ng/mL      Assessment & Plan:   Problem List Items Addressed This Visit  None    Visit Diagnoses    Thrombosed external hemorrhoid       Relevant Medications   hydrocortisone (ANUSOL-HC) 25 MG suppository      Acute R inflamed vs thrombosed presumed to be external hemorrhoid  Limited rectal exam due to pain and swelling No evidence of anal fissure or any perineal problems, no sign of erythema or cellulitis. - Failed topical preparation H OTC  Plan: 1. Urgently will call [facility] to see if we can get him worked in today today to [employer] - we were able to schedule him for acute work in apt with Dr Christian Mate at 245pm today - re order Anusol Suppository - Start Sitz Baths or warm bathtub soaks to help resolve flare - Avoid constipation and straining, recommend high fiber diet, improve hydration  Reviewed return criteria if not improving  Meds ordered this encounter  Medications  .  hydrocortisone (ANUSOL-HC) 25 MG suppository    Sig: Place 1 suppository (25 mg total) rectally 2 (two) times daily. For 7 days    Dispense:  14 suppository    Refill:  2      Follow up plan: Return if symptoms worsen or fail to improve, for hemorrhoid.   [name], DO [facility] 03/15/2019, 9:53 AM

## 2019-03-16 ENCOUNTER — Telehealth: Payer: Self-pay | Admitting: Surgery

## 2019-03-16 ENCOUNTER — Encounter
Admission: RE | Admit: 2019-03-16 | Discharge: 2019-03-16 | Disposition: A | Discharge status: Home / Self Care | Payer: Medicare Other | Source: Ambulatory Visit | Attending: Surgery | Admitting: Surgery

## 2019-03-16 ENCOUNTER — Other Ambulatory Visit: Payer: Self-pay

## 2019-03-16 HISTORY — DX: Essential (primary) hypertension: I10

## 2019-03-16 NOTE — Telephone Encounter (Signed)
Pt has been advised of pre admission date/time, COVID Testing date and Surgery date.  Surgery Date: 03/30/19 Preadmission Testing Date: 03/16/19 (8a-1p)  COVID Testing Date: 03/28/19 - patient advised to go to [facility] ([place]  Patient has been made aware to call [phone], between 1-3:00pm the day before surgery, to find out what time to arrive.

## 2019-03-16 NOTE — Patient Instructions (Signed)
Your pre-op EKG test is scheduled on: Thursday 03/24/19 9:00 am.  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 Your Drive up COVID test is scheduled on: Monday 03/28/19 any time 8:00-10:30am in front of [place]  Your procedure is scheduled on: Wednesday 03/30/19 Report to Same Day Surgery (Enter through [facility], take elevator to 2nd floor.  Check in with surgery information desk.) To find out your arrival time, call [phone] 1:00-3:00 PM on Tuesday 03/29/19  Remember: Instructions that are not followed completely may result in serious medical risk, up to and including death, or upon the discretion of your surgeon and anesthesiologist your surgery may need to be rescheduled.   __x__ 1. Do not eat food (including mints, candies, chewing gum) after midnight the night before your procedure. You may drink clear liquids up to 2 hours before you are scheduled to arrive at the hospital for your procedure.  Do not drink anything within 2 hours of your scheduled arrival to the hospital.  Approved clear liquids:  --Water or Apple juice without pulp  --Clear carbohydrate beverage such as Gatorade or Powerade  --Black Coffee or Clear Tea (No milk, no creamers, do not add anything to the coffee or tea)   __x__ 2. No Alcohol or smoking for 24 hours before or after surgery.  __x__ 3. Notify your doctor if there is any change in your medical condition (cold, fever, infections).  __x__ 4. On the morning of surgery brush your teeth with toothpaste and water.  You may rinse your mouth with mouthwash if you wish.  Do not swallow any toothpaste or mouthwash.  Please read over the following fact sheets that you were given: [facility] Preparing for Surgery, MRSA Information   __x__ Use CHG Soap as directed on instruction sheet.  Do not wear jewelry, lotions, powders, deodorant or perfumes on the day of surgery. Do not shave below your face/neck 48 hours prior to surgery.  Do not bring valuables to the  hospital.  [facility] is not responsible for any belongings or valuables.   Glasses may not be worn into surgery. For patients discharged on the day of surgery, you will NOT be permitted to drive yourself home.  You must have a responsible adult with you for 24 hours after surgery.  __x__ Take these medicines on the morning of surgery with a SMALL SIP OF WATER:  1. Omeprazole (Prilosec)  2. Fexofenadine (Allegra)  3. Finasteride (Proscar)  Skip your Hydrochlorothiazide (HCTZ) only on the morning of surgery.  No need to stop this before the day of surgery.  __x__ Use inhalers (Albuterol) on the day of surgery  ____ Follow recommendations from Cardiologist, Pulmonologist or PCP about stopping any blood thinners (Aspirin, Coumadin, Plavix, Eliquis, Effient, Pradaxa, and Pletal)  __x__ STARTING TODAY UNTIL THE DAY OF SURGERY: Do not take any Anti-inflammatories such as Meloxicam, Mobic, Advil, Ibuprofen, Motrin, Aleve, Naproxen, Naprosyn, BC/Goodies powders or aspirin products. You may continue to take Tylenol and Celebrex.   __x__ STARTING TODAY UNTIL THE DAY OF SURGERY: Do not take any over the counter vitamins or supplements.  RN reviewed instructions with patient via telephone interview.  Patient expressed understanding, will receive printed copy on the day of EKG visit.   ________________________ Phone call 03/16/19 @ 12:43pm

## 2019-03-17 ENCOUNTER — Other Ambulatory Visit: Payer: Medicare Other

## 2019-03-17 ENCOUNTER — Ambulatory Visit: Payer: Medicare Other | Attending: Internal Medicine

## 2019-03-17 DIAGNOSIS — Z23 Encounter for immunization: Secondary | ICD-10-CM

## 2019-03-17 NOTE — Progress Notes (Signed)
   Covid-19 Vaccination Clinic  Name:  Curtis Gregory    MRN: [id number] DOB: [birth year]  03/17/2019  [name] was observed post Covid-19 immunization for 15 minutes without incident. He was provided with Vaccine Information Sheet and instruction to access the V-Safe system.   [name] was instructed to call 911 with any severe reactions post vaccine: [name] Kitchen Difficulty breathing  . Swelling of face and throat  . A fast heartbeat  . A bad rash all over body  . Dizziness and weakness   Immunizations Administered    Name Date Dose VIS Date Route   Pfizer COVID-19 Vaccine 03/17/2019 10:11 AM 0.3 mL 12/24/2018 Intramuscular   Manufacturer: [place]   Lot: [id number]   [place]: [id number]

## 2019-03-24 ENCOUNTER — Other Ambulatory Visit: Payer: Self-pay

## 2019-03-24 ENCOUNTER — Encounter
Admission: RE | Admit: 2019-03-24 | Discharge: 2019-03-24 | Disposition: A | Discharge status: Home / Self Care | Payer: Medicare Other | Source: Ambulatory Visit | Attending: Surgery | Admitting: Surgery

## 2019-03-24 ENCOUNTER — Other Ambulatory Visit: Payer: Self-pay | Admitting: Family Medicine

## 2019-03-24 DIAGNOSIS — Z01810 Encounter for preprocedural cardiovascular examination: Secondary | ICD-10-CM | POA: Diagnosis not present

## 2019-03-24 DIAGNOSIS — Z01812 Encounter for preprocedural laboratory examination: Secondary | ICD-10-CM | POA: Diagnosis not present

## 2019-03-24 DIAGNOSIS — I10 Essential (primary) hypertension: Secondary | ICD-10-CM | POA: Insufficient documentation

## 2019-03-24 DIAGNOSIS — R001 Bradycardia, unspecified: Secondary | ICD-10-CM | POA: Diagnosis not present

## 2019-03-24 DIAGNOSIS — J302 Other seasonal allergic rhinitis: Secondary | ICD-10-CM

## 2019-03-24 LAB — CBC WITH DIFFERENTIAL/PLATELET
Abs Immature Granulocytes: 0.01 10*3/uL (ref 0.00–0.07)
Basophils Absolute: 0.1 10*3/uL (ref 0.0–0.1)
Basophils Relative: 2 %
Eosinophils Absolute: 0.2 10*3/uL (ref 0.0–0.5)
Eosinophils Relative: 3 %
HCT: 44.6 % (ref 39.0–52.0)
Hemoglobin: 14.8 g/dL (ref 13.0–17.0)
Immature Granulocytes: 0 %
Lymphocytes Relative: 49 %
Lymphs Abs: 2.4 10*3/uL (ref 0.7–4.0)
MCH: 31 pg (ref 26.0–34.0)
MCHC: 33.2 g/dL (ref 30.0–36.0)
MCV: 93.3 fL (ref 80.0–100.0)
Monocytes Absolute: 0.5 10*3/uL (ref 0.1–1.0)
Monocytes Relative: 11 %
Neutro Abs: 1.7 10*3/uL (ref 1.7–7.7)
Neutrophils Relative %: 35 %
Platelets: 268 10*3/uL (ref 150–400)
RBC: 4.78 MIL/uL (ref 4.22–5.81)
RDW: 12.8 % (ref 11.5–15.5)
WBC: 4.8 10*3/uL (ref 4.0–10.5)
nRBC: 0 % (ref 0.0–0.2)

## 2019-03-24 LAB — COMPREHENSIVE METABOLIC PANEL
ALT: 14 U/L (ref 0–44)
AST: 20 U/L (ref 15–41)
Albumin: 3.8 g/dL (ref 3.5–5.0)
Alkaline Phosphatase: 48 U/L (ref 38–126)
Anion gap: 8 (ref 5–15)
BUN: 25 mg/dL — ABNORMAL HIGH (ref 8–23)
CO2: 27 mmol/L (ref 22–32)
Calcium: 8.7 mg/dL — ABNORMAL LOW (ref 8.9–10.3)
Chloride: 106 mmol/L (ref 98–111)
Creatinine, Ser: 1.13 mg/dL (ref 0.61–1.24)
GFR calc Af Amer: >60 mL/min (ref >60)
GFR calc non Af Amer: >60 mL/min (ref >60)
Glucose, Bld: 108 mg/dL — ABNORMAL HIGH (ref 70–99)
Potassium: 3.5 mmol/L (ref 3.5–5.1)
Sodium: 141 mmol/L (ref 135–145)
Total Bilirubin: 0.9 mg/dL (ref 0.3–1.2)
Total Protein: 6.9 g/dL (ref 6.5–8.1)

## 2019-03-28 ENCOUNTER — Other Ambulatory Visit
Admission: RE | Admit: 2019-03-28 | Discharge: 2019-03-28 | Disposition: A | Discharge status: Home / Self Care | Payer: Medicare Other | Source: Ambulatory Visit | Attending: Surgery | Admitting: Surgery

## 2019-03-28 ENCOUNTER — Other Ambulatory Visit: Payer: Self-pay

## 2019-03-28 DIAGNOSIS — Z01812 Encounter for preprocedural laboratory examination: Secondary | ICD-10-CM | POA: Diagnosis not present

## 2019-03-28 DIAGNOSIS — Z20822 Contact with and (suspected) exposure to covid-19: Secondary | ICD-10-CM | POA: Insufficient documentation

## 2019-03-28 LAB — SARS CORONAVIRUS 2 (TAT 6-24 HRS): SARS Coronavirus 2: NEGATIVE

## 2019-03-29 MED ORDER — METRONIDAZOLE IN NACL 5-0.79 MG/ML-% IV SOLN
500.0000 mg | INTRAVENOUS | Status: AC
  Administered 2019-03-30: 500 mg via INTRAVENOUS
  Filled 2019-03-29: qty 100

## 2019-03-30 ENCOUNTER — Other Ambulatory Visit: Payer: Self-pay

## 2019-03-30 ENCOUNTER — Encounter: Payer: Self-pay | Admitting: Surgery

## 2019-03-30 ENCOUNTER — Encounter
Admission: RE | Disposition: A | Discharge status: Home / Self Care | Payer: Self-pay | Source: Home / Self Care | Attending: Surgery

## 2019-03-30 ENCOUNTER — Ambulatory Visit: Payer: Medicare Other | Admitting: Anesthesiology

## 2019-03-30 ENCOUNTER — Ambulatory Visit
Admission: RE | Admit: 2019-03-30 | Discharge: 2019-03-30 | Disposition: A | Discharge status: Home / Self Care | Payer: Medicare Other | Attending: Surgery | Admitting: Surgery

## 2019-03-30 DIAGNOSIS — J4520 Mild intermittent asthma, uncomplicated: Secondary | ICD-10-CM | POA: Diagnosis not present

## 2019-03-30 DIAGNOSIS — E785 Hyperlipidemia, unspecified: Secondary | ICD-10-CM | POA: Insufficient documentation

## 2019-03-30 DIAGNOSIS — Z79899 Other long term (current) drug therapy: Secondary | ICD-10-CM | POA: Diagnosis not present

## 2019-03-30 DIAGNOSIS — K641 Second degree hemorrhoids: Secondary | ICD-10-CM | POA: Diagnosis not present

## 2019-03-30 DIAGNOSIS — Z8249 Family history of ischemic heart disease and other diseases of the circulatory system: Secondary | ICD-10-CM | POA: Insufficient documentation

## 2019-03-30 DIAGNOSIS — K648 Other hemorrhoids: Secondary | ICD-10-CM

## 2019-03-30 DIAGNOSIS — K645 Perianal venous thrombosis: Secondary | ICD-10-CM

## 2019-03-30 DIAGNOSIS — H409 Unspecified glaucoma: Secondary | ICD-10-CM | POA: Diagnosis not present

## 2019-03-30 DIAGNOSIS — N400 Enlarged prostate without lower urinary tract symptoms: Secondary | ICD-10-CM | POA: Diagnosis not present

## 2019-03-30 DIAGNOSIS — G4730 Sleep apnea, unspecified: Secondary | ICD-10-CM | POA: Diagnosis not present

## 2019-03-30 DIAGNOSIS — Z791 Long term (current) use of non-steroidal anti-inflammatories (NSAID): Secondary | ICD-10-CM | POA: Insufficient documentation

## 2019-03-30 DIAGNOSIS — K219 Gastro-esophageal reflux disease without esophagitis: Secondary | ICD-10-CM | POA: Insufficient documentation

## 2019-03-30 DIAGNOSIS — I10 Essential (primary) hypertension: Secondary | ICD-10-CM | POA: Diagnosis not present

## 2019-03-30 DIAGNOSIS — G8929 Other chronic pain: Secondary | ICD-10-CM | POA: Insufficient documentation

## 2019-03-30 HISTORY — PX: HEMORRHOID SURGERY: SHX153

## 2019-03-30 SURGERY — HEMORRHOIDECTOMY
Anesthesia: General

## 2019-03-30 MED ORDER — LACTATED RINGERS IV SOLN: INTRAVENOUS | Status: DC

## 2019-03-30 MED ORDER — CHLORHEXIDINE GLUCONATE CLOTH 2 % EX PADS
6.0000 | MEDICATED_PAD | Freq: Once | CUTANEOUS | Status: AC
  Administered 2019-03-30: 6 via TOPICAL

## 2019-03-30 MED ORDER — BUPIVACAINE LIPOSOME 1.3 % IJ SUSP
INTRAMUSCULAR | Status: AC
  Filled 2019-03-30: qty 20

## 2019-03-30 MED ORDER — DEXAMETHASONE SODIUM PHOSPHATE 10 MG/ML IJ SOLN
INTRAMUSCULAR | Status: DC | PRN
  Administered 2019-03-30: 5 mg via INTRAVENOUS

## 2019-03-30 MED ORDER — FENTANYL CITRATE (PF) 100 MCG/2ML IJ SOLN
INTRAMUSCULAR | Status: DC | PRN
  Administered 2019-03-30 (×2): 50 ug via INTRAVENOUS

## 2019-03-30 MED ORDER — BUPIVACAINE-EPINEPHRINE 0.25% -1:200000 IJ SOLN
INTRAMUSCULAR | Status: DC | PRN
  Administered 2019-03-30: 30 mL

## 2019-03-30 MED ORDER — GABAPENTIN 300 MG PO CAPS
ORAL_CAPSULE | ORAL | Status: AC
  Administered 2019-03-30: 300 mg via ORAL
  Filled 2019-03-30: qty 1

## 2019-03-30 MED ORDER — GABAPENTIN 300 MG PO CAPS: 300.0000 mg | ORAL_CAPSULE | ORAL | Status: AC

## 2019-03-30 MED ORDER — FENTANYL CITRATE (PF) 100 MCG/2ML IJ SOLN: 25.0000 ug | INTRAMUSCULAR | Status: DC | PRN

## 2019-03-30 MED ORDER — FENTANYL CITRATE (PF) 100 MCG/2ML IJ SOLN
INTRAMUSCULAR | Status: AC
  Filled 2019-03-30: qty 2

## 2019-03-30 MED ORDER — CELECOXIB 200 MG PO CAPS
ORAL_CAPSULE | ORAL | Status: AC
  Administered 2019-03-30: 200 mg via ORAL
  Filled 2019-03-30: qty 1

## 2019-03-30 MED ORDER — PROPOFOL 10 MG/ML IV BOLUS
INTRAVENOUS | Status: DC | PRN
  Administered 2019-03-30: 200 mg via INTRAVENOUS

## 2019-03-30 MED ORDER — PROPOFOL 10 MG/ML IV BOLUS
INTRAVENOUS | Status: AC
  Filled 2019-03-30: qty 20

## 2019-03-30 MED ORDER — MEPERIDINE HCL 50 MG/ML IJ SOLN: 6.2500 mg | INTRAMUSCULAR | Status: DC | PRN

## 2019-03-30 MED ORDER — BUPIVACAINE-EPINEPHRINE (PF) 0.25% -1:200000 IJ SOLN
INTRAMUSCULAR | Status: AC
  Filled 2019-03-30: qty 30

## 2019-03-30 MED ORDER — GELATIN ABSORBABLE 100 CM EX MISC
CUTANEOUS | Status: AC
  Filled 2019-03-30: qty 1

## 2019-03-30 MED ORDER — MIDAZOLAM HCL 2 MG/2ML IJ SOLN
INTRAMUSCULAR | Status: DC | PRN
  Administered 2019-03-30: 2 mg via INTRAVENOUS

## 2019-03-30 MED ORDER — SUCCINYLCHOLINE CHLORIDE 20 MG/ML IJ SOLN
INTRAMUSCULAR | Status: DC | PRN
  Administered 2019-03-30: 120 mg via INTRAVENOUS

## 2019-03-30 MED ORDER — OXYCODONE HCL 5 MG/5ML PO SOLN: 5.0000 mg | Freq: Once | ORAL | Status: DC | PRN

## 2019-03-30 MED ORDER — IBUPROFEN 800 MG PO TABS: 800.0000 mg | ORAL_TABLET | Freq: Three times a day (TID) | ORAL | 0 refills | Status: DC | PRN

## 2019-03-30 MED ORDER — LIDOCAINE HCL (CARDIAC) PF 100 MG/5ML IV SOSY
PREFILLED_SYRINGE | INTRAVENOUS | Status: DC | PRN
  Administered 2019-03-30: 100 mg via INTRAVENOUS

## 2019-03-30 MED ORDER — PROMETHAZINE HCL 25 MG/ML IJ SOLN: 6.2500 mg | INTRAMUSCULAR | Status: DC | PRN

## 2019-03-30 MED ORDER — DIBUCAINE (PERIANAL) 1 % EX OINT
TOPICAL_OINTMENT | CUTANEOUS | Status: AC
  Filled 2019-03-30: qty 28

## 2019-03-30 MED ORDER — ACETAMINOPHEN 500 MG PO TABS
ORAL_TABLET | ORAL | Status: AC
  Administered 2019-03-30: 1000 mg via ORAL
  Filled 2019-03-30: qty 2

## 2019-03-30 MED ORDER — ACETAMINOPHEN 500 MG PO TABS: 1000.0000 mg | ORAL_TABLET | ORAL | Status: AC

## 2019-03-30 MED ORDER — ONDANSETRON HCL 4 MG/2ML IJ SOLN
INTRAMUSCULAR | Status: AC
  Filled 2019-03-30: qty 2

## 2019-03-30 MED ORDER — SUCCINYLCHOLINE CHLORIDE 200 MG/10ML IV SOSY
PREFILLED_SYRINGE | INTRAVENOUS | Status: AC
  Filled 2019-03-30: qty 10

## 2019-03-30 MED ORDER — BUPIVACAINE LIPOSOME 1.3 % IJ SUSP: 20.0000 mL | Freq: Once | INTRAMUSCULAR | Status: DC

## 2019-03-30 MED ORDER — GELATIN ABSORBABLE 12-7 MM EX MISC
CUTANEOUS | Status: DC | PRN
  Administered 2019-03-30: 1

## 2019-03-30 MED ORDER — HYDROCODONE-ACETAMINOPHEN 5-325 MG PO TABS: 1.0000 | ORAL_TABLET | Freq: Four times a day (QID) | ORAL | 0 refills | Status: DC | PRN

## 2019-03-30 MED ORDER — ONDANSETRON HCL 4 MG/2ML IJ SOLN
INTRAMUSCULAR | Status: DC | PRN
  Administered 2019-03-30: 4 mg via INTRAVENOUS

## 2019-03-30 MED ORDER — ROCURONIUM BROMIDE 100 MG/10ML IV SOLN
INTRAVENOUS | Status: DC | PRN
  Administered 2019-03-30: 20 mg via INTRAVENOUS
  Administered 2019-03-30: 5 mg via INTRAVENOUS

## 2019-03-30 MED ORDER — DEXAMETHASONE SODIUM PHOSPHATE 10 MG/ML IJ SOLN
INTRAMUSCULAR | Status: AC
  Filled 2019-03-30: qty 1

## 2019-03-30 MED ORDER — MIDAZOLAM HCL 2 MG/2ML IJ SOLN
INTRAMUSCULAR | Status: AC
  Filled 2019-03-30: qty 2

## 2019-03-30 MED ORDER — BUPIVACAINE LIPOSOME 1.3 % IJ SUSP
INTRAMUSCULAR | Status: DC | PRN
  Administered 2019-03-30: 20 mL

## 2019-03-30 MED ORDER — OXYCODONE HCL 5 MG PO TABS: 5.0000 mg | ORAL_TABLET | Freq: Once | ORAL | Status: DC | PRN

## 2019-03-30 MED ORDER — CELECOXIB 200 MG PO CAPS: 200.0000 mg | ORAL_CAPSULE | ORAL | Status: AC

## 2019-03-30 MED ORDER — PHENYLEPHRINE HCL (PRESSORS) 10 MG/ML IV SOLN
INTRAVENOUS | Status: DC | PRN
  Administered 2019-03-30: 100 ug via INTRAVENOUS
  Administered 2019-03-30 (×2): 50 ug via INTRAVENOUS

## 2019-03-30 SURGICAL SUPPLY — 26 items
BLADE SURG 15 STRL LF DISP TIS (BLADE) ×1 IMPLANT
BLADE SURG 15 STRL SS (BLADE) ×2
BRIEF STRETCH MATERNITY 2XLG (MISCELLANEOUS) ×1 IMPLANT
CANISTER SUCT 1200ML W/VALVE (MISCELLANEOUS) ×2 IMPLANT
COVER WAND RF STERILE (DRAPES) ×2 IMPLANT
DRAPE LAPAROTOMY 100X77 ABD (DRAPES) ×2 IMPLANT
DRSG GAUZE FLUFF 36X18 (GAUZE/BANDAGES/DRESSINGS) ×1 IMPLANT
ELECT CAUTERY BLADE TIP 2.5 (TIP) ×2
ELECT REM PT RETURN 9FT ADLT (ELECTROSURGICAL) ×2
ELECTRODE CAUTERY BLDE TIP 2.5 (TIP) ×1 IMPLANT
ELECTRODE REM PT RTRN 9FT ADLT (ELECTROSURGICAL) ×1 IMPLANT
GAUZE SPONGE 4X4 12PLY STRL (GAUZE/BANDAGES/DRESSINGS) ×1 IMPLANT
GLOVE ORTHO TXT STRL SZ7.5 (GLOVE) ×3 IMPLANT
GOWN STRL REUS W/ TWL LRG LVL3 (GOWN DISPOSABLE) ×2 IMPLANT
GOWN STRL REUS W/TWL LRG LVL3 (GOWN DISPOSABLE) ×6
KIT TURNOVER KIT A (KITS) ×2 IMPLANT
NEEDLE HYPO 22GX1.5 SAFETY (NEEDLE) ×2 IMPLANT
PACK BASIN MINOR ARMC (MISCELLANEOUS) ×2 IMPLANT
SHEARS HARMONIC 9CM CVD (BLADE) ×1 IMPLANT
SOL PREP PVP 2OZ (MISCELLANEOUS) ×2
SOLUTION PREP PVP 2OZ (MISCELLANEOUS) ×1 IMPLANT
SURGILUBE 2OZ TUBE FLIPTOP (MISCELLANEOUS) ×2 IMPLANT
SUT CHROMIC 3 0 SH 27 (SUTURE) ×3 IMPLANT
SWABSTK COMLB BENZOIN TINCTURE (MISCELLANEOUS) ×2 IMPLANT
SYR 10ML LL (SYRINGE) ×2 IMPLANT
TAPE CLOTH 3X10 WHT NS LF (GAUZE/BANDAGES/DRESSINGS) ×2 IMPLANT

## 2019-03-30 NOTE — Transfer of Care (Signed)
Immediate Anesthesia Transfer of Care Note  Patient: Curtis Gregory  Procedure(s) Performed: HEMORRHOIDECTOMY (N/A )  Patient Location: PACU  Anesthesia Type:General  Level of Consciousness: awake and drowsy  Airway & Oxygen Therapy: Patient Spontanous Breathing and Patient connected to face mask oxygen  Post-op Assessment: Report given to RN and Post -op Vital signs reviewed and stable  Post vital signs: Reviewed and stable  Last Vitals:  Vitals Value Taken Time  BP 150/89 03/30/19 1340  Temp 36.1 C 03/30/19 1340  Pulse 78 03/30/19 1342  Resp 17 03/30/19 1342  SpO2 100 % 03/30/19 1342  Vitals shown include unvalidated device data.  Last Pain:  Vitals:   03/30/19 1058  TempSrc: Tympanic  PainSc: 0-No pain         Complications: No apparent anesthesia complications

## 2019-03-30 NOTE — Op Note (Signed)
SURGICAL OPERATIVE REPORT   DATE OF PROCEDURE: 03/30/2019  ATTENDING Surgeon(s): Ronny Bacon, MD  ANESTHESIA: GETA  PRE-OPERATIVE DIAGNOSIS: Symptomatic (painful)/Bleeding 2nd degree internal with thrombosed external hemorrhoids with symptoms not well-controlled despite medical management  POST-OPERATIVE DIAGNOSIS:  Same PROCEDURE(S):  1.) Anoscopy 2.)  Hemorrhoidectomy internal and external 2 columns.  INTRAOPERATIVE FINDINGS: 2nd internal hemorrhoids and thrombosed external hemorrhoids  ESTIMATED BLOOD LOSS: Minimal (<20 mL)  URINE OUTPUT: No foley   SPECIMENS: Right and left sided internal and external hemorrhoids.  DRAINS: None   COMPLICATIONS: None apparent   CONDITION AT END OF PROCEDURE: Hemodynamically stable and extubated   DISPOSITION OF PATIENT: PACU  INDICATIONS FOR PROCEDURE:   Patient is a 69 y.o. male who presented with symptomatic (painful)/bleeding 2nd degree internal and external thrombosed hemorrhoids not well-controlled with medical management.  All risks, benefits, and alternatives to above procedure were discussed with the patient, all of patient's questions were answered to his expressed satisfaction, and informed consent was obtained and documented.  DETAILS OF PROCEDURE: Patient was brought to the operative suite and appropriately identified. General anesthesia was administered along with appropriate pre-operative antibiotics, and endotracheal intubation was performed by anesthetist.  In prone/jackknife position, operative site was prepped and draped in the usual sterile fashion, and following a brief time out, rigid anoscopy was performed with findings of above. Local anesthetic was injected, and two hemorrhoidal pedicles were identified.  I took a 3-0 chromic and applied it at the proximal/rectal extent of the internal hemorrhoid.  An Alice clamp was placed near the external hemorrhoidal aspect of each pedicle/first the right pedicle and then the  left pedicle near the dentate line and retracted medially to exteriorize the hemorrhoidal pedicle. Retracted hemorrhoid was carefully dissected from underlying/adherent tissues, and Focus Harmonic was advanced across the base of the hemorrhoidal pedicle and fired, and the above was repeated for the opposite side. Hemostasis was confirmed, and Nupercainal ointment Gelfoam was rolled and inserted into the anal canal for additional hemostasis and pain control.  Patient was then safely able to be extubated, awakened, and transferred to PACU for post-operative monitoring and care.  I was present for all aspects of the above procedure, and no operative complications were apparent.

## 2019-03-30 NOTE — Anesthesia Postprocedure Evaluation (Signed)
Anesthesia Post Note  Patient: Kekai Lemanski  Procedure(s) Performed: HEMORRHOIDECTOMY (N/A )  Patient location during evaluation: PACU Anesthesia Type: General Level of consciousness: awake and alert and oriented Pain management: pain level controlled Vital Signs Assessment: post-procedure vital signs reviewed and stable Respiratory status: spontaneous breathing, nonlabored ventilation and respiratory function stable Cardiovascular status: blood pressure returned to baseline and stable Postop Assessment: no signs of nausea or vomiting Anesthetic complications: no     Last Vitals:  Vitals:   03/30/19 1410 03/30/19 1423  BP: (!) 142/85 (!) 145/77  Pulse: 62 64  Resp: 17 16  Temp: (!) 36.1 C 36.6 C  SpO2: 100% 100%    Last Pain:  Vitals:   03/30/19 1423  TempSrc: Temporal  PainSc: 0-No pain                 Letticia Bhattacharyya

## 2019-03-30 NOTE — Interval H&P Note (Signed)
History and Physical Interval Note:  03/30/2019 12:17 PM  Curtis Gregory  has presented today for surgery, with the diagnosis of external & internal hemorrhoids.  The various methods of treatment have been discussed with the patient and family. After consideration of risks, benefits and other options for treatment, the patient has consented to  Procedure(s): HEMORRHOIDECTOMY (N/A) as a surgical intervention.  The patient's history has been reviewed, patient examined, no change in status, stable for surgery.  I have reviewed the patient's chart and labs.  Questions were answered to the patient's satisfaction.     Ronny Bacon

## 2019-03-30 NOTE — Discharge Instructions (Signed)

## 2019-03-30 NOTE — Anesthesia Procedure Notes (Signed)
Procedure Name: Intubation Date/Time: 03/30/2019 12:27 PM Performed by: Jonna Clark, CRNA Pre-anesthesia Checklist: Patient identified, Patient being monitored, Timeout performed, Emergency Drugs available and Suction available Patient Re-evaluated:Patient Re-evaluated prior to induction Oxygen Delivery Method: Circle system utilized Preoxygenation: Pre-oxygenation with 100% oxygen Induction Type: IV induction Ventilation: Mask ventilation without difficulty Laryngoscope Size: Mac and 4 Grade View: Grade I Tube type: Oral Tube size: 7.5 mm Number of attempts: 1 Airway Equipment and Method: Stylet Placement Confirmation: ETT inserted through vocal cords under direct vision,  positive ETCO2 and breath sounds checked- equal and bilateral Secured at: 21 cm Tube secured with: Tape Dental Injury: Teeth and Oropharynx as per pre-operative assessment

## 2019-03-30 NOTE — Anesthesia Preprocedure Evaluation (Signed)
Anesthesia Evaluation  Patient identified by MRN, date of birth, ID band Patient awake    Reviewed: Allergy & Precautions, NPO status , Patient's Chart, lab work & pertinent test results  History of Anesthesia Complications Negative for: history of anesthetic complications  Airway Mallampati: II  TM Distance: >3 FB Neck ROM: Full    Dental no notable dental hx.    Pulmonary asthma , sleep apnea and Continuous Positive Airway Pressure Ventilation ,    breath sounds clear to auscultation- rhonchi (-) wheezing      Cardiovascular hypertension, Pt. on medications (-) CAD, (-) Past MI, (-) Cardiac Stents and (-) CABG  Rhythm:Regular Rate:Normal - Systolic murmurs and - Diastolic murmurs    Neuro/Psych neg Seizures negative neurological ROS  negative psych ROS   GI/Hepatic Neg liver ROS, GERD  ,  Endo/Other  negative endocrine ROSneg diabetes  Renal/GU negative Renal ROS     Musculoskeletal negative musculoskeletal ROS (+)   Abdominal (+) + obese,   Peds  Hematology negative hematology ROS (+)   Anesthesia Other Findings Past Medical History: No date: Asthma 06/16/2014: Benign prostatic hypertrophy with urinary frequency 06/16/2014: Excessive urination at night No date: GERD (gastroesophageal reflux disease) No date: Glaucoma No date: Hyperlipemia No date: Hypertension No date: Sleep apnea   Reproductive/Obstetrics                             Anesthesia Physical Anesthesia Plan  ASA: II  Anesthesia Plan: General   Post-op Pain Management:    Induction: Intravenous  PONV Risk Score and Plan: 1 and Ondansetron and Dexamethasone  Airway Management Planned: LMA  Additional Equipment:   Intra-op Plan:   Post-operative Plan:   Informed Consent: I have reviewed the patients History and Physical, chart, labs and discussed the procedure including the risks, benefits and alternatives for  the proposed anesthesia with the patient or authorized representative who has indicated his/her understanding and acceptance.     Dental advisory given  Plan Discussed with: CRNA and Anesthesiologist  Anesthesia Plan Comments:         Anesthesia Quick Evaluation

## 2019-03-31 LAB — SURGICAL PATHOLOGY

## 2019-04-01 ENCOUNTER — Telehealth: Payer: Self-pay

## 2019-04-01 NOTE — Telephone Encounter (Signed)
Patient called to see if there was anything else he could do about pain post operatively hemorrhoidectomy done on 03/30/19. Patient instructed to alternate Ibuprofen and Hydrocodone every 4 hours. Do sitz baths several times a day and especially after bowel movements. Take stool softeners to avoid constipation. He may also use a cold compress to the surgical area to help with pain and inflammation. He is amendable to this and will call back with any further questions.

## 2019-04-04 ENCOUNTER — Other Ambulatory Visit: Payer: Self-pay | Admitting: Surgery

## 2019-04-04 ENCOUNTER — Telehealth: Payer: Self-pay | Admitting: *Deleted

## 2019-04-04 MED ORDER — HYDROCODONE-ACETAMINOPHEN 5-325 MG PO TABS: 2.0000 | ORAL_TABLET | Freq: Four times a day (QID) | ORAL | 0 refills | Status: DC | PRN

## 2019-04-04 NOTE — Telephone Encounter (Signed)
Patient notified of RX at [facility].

## 2019-04-04 NOTE — Telephone Encounter (Signed)
Patient called and stated that he is still having a lot of pain and the bleeding started back last night. He did state that the bleeding is off and on.  The pain medication is not really helping, he is taking hydrocodone.    Patient had surgery on 03/30/19 Dr Christian Mate Hemorrhoidectomy

## 2019-04-04 NOTE — Telephone Encounter (Signed)
Hemorrhoidectomy 03/30/19- patient states he is having on/off bleeding at times- small amount- taking sitz baths- He is taking 2 Tylenol 625 mg every 8 hours as directed but it is not helping. He has taken all hydrocodone. He is taking Colace. His bowel movements are good- he is requesting something for the pain. He is applying the Lidocaine ointment, however  it last a short time.

## 2019-04-08 ENCOUNTER — Ambulatory Visit: Payer: Self-pay | Admitting: Urology

## 2019-04-14 ENCOUNTER — Other Ambulatory Visit: Payer: Self-pay

## 2019-04-14 ENCOUNTER — Encounter: Payer: Self-pay | Admitting: Surgery

## 2019-04-14 ENCOUNTER — Ambulatory Visit (INDEPENDENT_AMBULATORY_CARE_PROVIDER_SITE_OTHER): Payer: Self-pay | Admitting: Surgery

## 2019-04-14 VITALS — BP 142/79 | HR 61 | Temp 97.9°F | Ht 68.0 in | Wt 217.0 lb

## 2019-04-14 DIAGNOSIS — Z9889 Other specified postprocedural states: Secondary | ICD-10-CM

## 2019-04-14 DIAGNOSIS — Z8719 Personal history of other diseases of the digestive system: Secondary | ICD-10-CM

## 2019-04-14 NOTE — Progress Notes (Signed)
[facility] POST-OP OFFICE VISIT  04/14/2019  HPI: Curtis Gregory is a 69 y.o. male 15 days s/p hemorrhoidectomy, 2 columns plus.  Reports no longer utilizing hydrocodone.  Pain about a 2 between bowel movements.  Small amount of bleeding with movements noted.  Actually has skipped a day of having a bowel movement, and not taking fiber.  However he is taking MiraLAX and Colace, and has complaints of burning with bowel activity.  He reported some night sweats of unknown etiology.  Vital signs: BP (!) 142/79   Pulse 61   Temp 97.9 F (36.6 C)   Ht 5\' 8"  (1.727 m)   Wt 217 lb (98.4 kg)   SpO2 98%   BMI 32.99 kg/m    Physical Exam: Constitutional: Appears well Abdomen: Soft nontender Skin: Perianal skin has expected tufts/healing areas.  No surrounding perianal induration, tenderness or erythema.  Assessment/Plan: This is a 69 y.o. male 15 days s/p hemorrhoidectomy  Patient Active Problem List   Diagnosis Date Noted  . Internal and external thrombosed hemorrhoids 03/15/2019  . Chronic pain of right knee 09/08/2018  . Elevated hemoglobin A1c 09/09/2017  . Vitamin D deficiency 09/09/2017  . Benign neoplasm of descending colon   . Seasonal allergies 04/30/2015  . Snoring 10/18/2014  . Torticollis, acute 10/18/2014  . Asthma, mild intermittent 06/16/2014  . Hyperlipidemia 06/16/2014  . Essential hypertension 06/16/2014  . GERD (gastroesophageal reflux disease) 06/16/2014  . Pain in the wrist 06/16/2014  . Apnea, sleep 06/16/2014  . Benign prostatic hyperplasia with urinary frequency 06/16/2014    -Encouraged him to supplement consistently with fiber.  Advised he should have about 1 bowel movement per every significant meal.  We discussed flatus, these sense of rectal fullness and following through with moving his bowels when he feels this.  Of asked him return in a month to see how he is progressing.   Ronny Bacon M.D., FACS 04/14/2019, 9:17 AM

## 2019-04-14 NOTE — Patient Instructions (Signed)
Continue to include fiber in your diet. You need 25-30 grams of fiber a day.  May take Metamucil in a full glass of liquid daily.  Follow up here in one month.    High-Fiber Diet Fiber, also called dietary fiber, is a type of carbohydrate that is found in fruits, vegetables, whole grains, and beans. A high-fiber diet can have many health benefits. Your health care provider may recommend a high-fiber diet to help:  Prevent constipation. Fiber can make your bowel movements more regular.  Lower your cholesterol.  Relieve the following conditions: ? Swelling of veins in the anus (hemorrhoids). ? Swelling and irritation (inflammation) of specific areas of the digestive tract (uncomplicated diverticulosis). ? A problem of the large intestine (colon) that sometimes causes pain and diarrhea (irritable bowel syndrome, IBS).  Prevent overeating as part of a weight-loss plan.  Prevent heart disease, type 2 diabetes, and certain cancers. What is my plan? The recommended daily fiber intake in grams (g) includes:  38 g for men age 68 or younger.  30 g for men over age 44.  79 g for women age 10 or younger.  21 g for women over age 11. You can get the recommended daily intake of dietary fiber by:  Eating a variety of fruits, vegetables, grains, and beans.  Taking a fiber supplement, if it is not possible to get enough fiber through your diet. What do I need to know about a high-fiber diet?  It is better to get fiber through food sources rather than from fiber supplements. There is not a lot of research about how effective supplements are.  Always check the fiber content on the nutrition facts label of any prepackaged food. Look for foods that contain 5 g of fiber or more per serving.  Talk with a diet and nutrition specialist (dietitian) if you have questions about specific foods that are recommended or not recommended for your medical condition, especially if those foods are not listed  below.  Gradually increase how much fiber you consume. If you increase your intake of dietary fiber too quickly, you may have bloating, cramping, or gas.  Drink plenty of water. Water helps you to digest fiber. What are tips for following this plan?  Eat a wide variety of high-fiber foods.  Make sure that half of the grains that you eat each day are whole grains.  Eat breads and cereals that are made with whole-grain flour instead of refined flour or white flour.  Eat brown rice, bulgur wheat, or millet instead of white rice.  Start the day with a breakfast that is high in fiber, such as a cereal that contains 5 g of fiber or more per serving.  Use beans in place of meat in soups, salads, and pasta dishes.  Eat high-fiber snacks, such as berries, raw vegetables, nuts, and popcorn.  Choose whole fruits and vegetables instead of processed forms like juice or sauce. What foods can I eat?  Fruits Berries. Pears. Apples. Oranges. Avocado. Prunes and raisins. Dried figs. Vegetables Sweet potatoes. Spinach. Kale. Artichokes. Cabbage. Broccoli. Cauliflower. Green peas. Carrots. Squash. Grains Whole-grain breads. Multigrain cereal. Oats and oatmeal. Brown rice. Barley. Bulgur wheat. [place]. Quinoa. Bran muffins. Popcorn. Rye wafer crackers. Meats and other proteins Navy, kidney, and pinto beans. Soybeans. Split peas. Lentils. Nuts and seeds. Dairy Fiber-fortified yogurt. Beverages Fiber-fortified soy milk. Fiber-fortified orange juice. Other foods Fiber bars. The items listed above may not be a complete list of recommended foods and beverages. Contact  a dietitian for more options. What foods are not recommended? Fruits Fruit juice. Cooked, strained fruit. Vegetables Fried potatoes. Canned vegetables. Well-cooked vegetables. Grains White bread. Pasta made with refined flour. White rice. Meats and other proteins Fatty cuts of meat. Fried chicken or fried fish. Dairy Milk.  Yogurt. Cream cheese. Sour cream. Fats and oils Butters. Beverages Soft drinks. Other foods Cakes and pastries. The items listed above may not be a complete list of foods and beverages to avoid. Contact a dietitian for more information. Summary  Fiber is a type of carbohydrate. It is found in fruits, vegetables, whole grains, and beans.  There are many health benefits of eating a high-fiber diet, such as preventing constipation, lowering blood cholesterol, helping with weight loss, and reducing your risk of heart disease, diabetes, and certain cancers.  Gradually increase your intake of fiber. Increasing too fast can result in cramping, bloating, and gas. Drink plenty of water while you increase your fiber.  The best sources of fiber include whole fruits and vegetables, whole grains, nuts, seeds, and beans. This information is not intended to replace advice given to you by your health care provider. Make sure you discuss any questions you have with your health care provider. Document Revised: 11/03/2016 Document Reviewed: 11/03/2016 Elsevier Patient Education  2020 [employer].

## 2019-05-11 ENCOUNTER — Other Ambulatory Visit: Payer: Self-pay | Admitting: Family Medicine

## 2019-05-11 DIAGNOSIS — I10 Essential (primary) hypertension: Secondary | ICD-10-CM

## 2019-05-11 NOTE — Telephone Encounter (Signed)
Requested Prescriptions  Pending Prescriptions Disp Refills  . hydrochlorothiazide (HYDRODIURIL) 12.5 MG tablet [Pharmacy Med Name: HYDROCHLOROTHIAZIDE 12.5 MG TB] 90 tablet 1    Sig: TAKE 1 TABLET BY MOUTH EVERY DAY     Cardiovascular: Diuretics - Thiazide Failed - 05/11/2019  3:14 AM      Failed - Ca in normal range and within 360 days    Calcium  Date Value Ref Range Status  03/24/2019 8.7 (L) 8.9 - 10.3 mg/dL Final         Failed - Last BP in normal range    BP Readings from Last 1 Encounters:  04/14/19 (!) 142/79         Passed - Cr in normal range and within 360 days    Creat  Date Value Ref Range Status  09/08/2017 1.08 0.70 - 1.25 mg/dL Final    Comment:    For patients >71 years of age, the reference limit for Creatinine is approximately 13% higher for people identified as African-American. .    Creatinine, Ser  Date Value Ref Range Status  03/24/2019 1.13 0.61 - 1.24 mg/dL Final         Passed - K in normal range and within 360 days    Potassium  Date Value Ref Range Status  03/24/2019 3.5 3.5 - 5.1 mmol/L Final         Passed - Na in normal range and within 360 days    Sodium  Date Value Ref Range Status  03/24/2019 141 135 - 145 mmol/L Final         Passed - Valid encounter within last 6 months    Recent Outpatient Visits          1 month ago Thrombosed external hemorrhoid   [place], DO   4 months ago Chronic pain of right knee   [place], DO   8 months ago Chronic pain of right knee   [place], DO   1 year ago Acute right-sided low back pain without sciatica   [facility] Hauser, DO   1 year ago Essential hypertension   [place], DO      Future Appointments            In 10 months [place], [place], [place]

## 2019-05-17 ENCOUNTER — Encounter: Payer: Self-pay | Admitting: Surgery

## 2019-05-17 ENCOUNTER — Other Ambulatory Visit: Payer: Self-pay

## 2019-05-17 ENCOUNTER — Ambulatory Visit (INDEPENDENT_AMBULATORY_CARE_PROVIDER_SITE_OTHER): Payer: Self-pay | Admitting: Surgery

## 2019-05-17 VITALS — BP 143/86 | HR 62 | Temp 97.7°F | Resp 12 | Wt 220.2 lb

## 2019-05-17 DIAGNOSIS — Z8719 Personal history of other diseases of the digestive system: Secondary | ICD-10-CM | POA: Insufficient documentation

## 2019-05-17 DIAGNOSIS — Z9889 Other specified postprocedural states: Secondary | ICD-10-CM

## 2019-05-17 NOTE — Progress Notes (Signed)
[facility] SURGICAL ASSOCIATES POST-OP OFFICE VISIT  05/17/2019  HPI: Curtis Gregory is a 69 y.o. male 7 weeks s/p 3 column hemorrhoidectomy.  He still continues to have pain with bowel movements and has some traces of blood in his stool and on wiping.  He reports having bowel movements twice daily taking a MiraLAX and Metamucil regimen.  He denies any fevers chills, nausea or vomiting.  He reports a good appetite.  He denies hard stools.  Vital signs: BP (!) 143/86   Pulse 62   Temp 97.7 F (36.5 C)   Resp 12   Wt 220 lb 3.2 oz (99.9 kg)   SpO2 99%   BMI 33.48 kg/m    Physical Exam: Constitutional: Appears well in general. Abdomen: Benign. Skin: Perianal skin appears to be healing well without any evidence of any lingering dermal wound aside from a small radial area of granulation tissue.  Does not tolerate attempts at digital examination very well with a remarkable degree of voluntary contraction spasm.  Assessment/Plan: This is a 69 y.o. male 7 weeks s/p 3 column hemorrhoidectomy still having more pain than I would expect considering being this far out from surgery.    Patient Active Problem List   Diagnosis Date Noted  . Internal and external thrombosed hemorrhoids 03/15/2019  . Chronic pain of right knee 09/08/2018  . Elevated hemoglobin A1c 09/09/2017  . Vitamin D deficiency 09/09/2017  . Benign neoplasm of descending colon   . Seasonal allergies 04/30/2015  . Snoring 10/18/2014  . Torticollis, acute 10/18/2014  . Asthma, mild intermittent 06/16/2014  . Hyperlipidemia 06/16/2014  . Essential hypertension 06/16/2014  . GERD (gastroesophageal reflux disease) 06/16/2014  . Pain in the wrist 06/16/2014  . Apnea, sleep 06/16/2014  . Benign prostatic hyperplasia with urinary frequency 06/16/2014    -I do not feel that I have much room for improvement with his bowel regimen and his bowel habits.  I am hoping that time alone will improve his pain syndrome and finish the healing  process that he is in the midst of.  We discussed rectal examination under anesthesia the possibility of a Botox or lateral internal sphincterotomy treatment to help alleviate the spastic component.  He is not interested in any surgery at this point in time, and we have agreed to defer any further intervention giving him another months to heal.   Ronny Bacon M.D., [facility] 05/17/2019, 3:24 PM

## 2019-05-17 NOTE — Patient Instructions (Addendum)
Continue to take the miralax and metamucil. We will follow up in one month, see your appointment below. please call the office if you have any questions or concerns.   Surgical Procedures for Hemorrhoids, Care After This sheet gives you information about how to care for yourself after your procedure. Your health care provider may also give you more specific instructions. If you have problems or questions, contact your health care provider. What can I expect after the procedure? After the procedure, it is common to have:  Rectal pain.  Pain when you are having a bowel movement.  Slight rectal bleeding. This is more likely to happen with the first bowel movement after surgery. Follow these instructions at home: Medicines  Take over-the-counter and prescription medicines only as told by your health care provider.  If you were prescribed an antibiotic medicine, use it as told by your health care provider. Do not stop using the antibiotic even if your condition improves.  Ask your health care provider if the medicine prescribed to you requires you to avoid driving or using heavy machinery.  Use a stool softener or a bulk laxative as told by your health care provider. Eating and drinking  Follow instructions from your health care provider about what to eat or drink after your procedure.  You may need to take actions to prevent or treat constipation, such as: ? Drink enough fluid to keep your urine pale yellow. ? Take over-the-counter or prescription medicines. ? Eat foods that are high in fiber, such as beans, whole grains, and fresh fruits and vegetables. ? Limit foods that are high in fat and processed sugars, such as fried or sweet foods. Activity   Rest as told by your health care provider.  Avoid sitting for a long time without moving. Get up to take short walks every 1-2 hours. This is important to improve blood flow and breathing. Ask for help if you feel weak or  unsteady.  Return to your normal activities as told by your health care provider. Ask your health care provider what activities are safe for you.  Do not lift anything that is heavier than 10 lb (4.5 kg), or the limit that you are told, until your health care provider says that it is safe.  Do not strain to have a bowel movement.  Do not spend a long time sitting on the toilet. General instructions   Take warm sitz baths for 15-20 minutes, 2-3 times a day to relieve soreness or itching and to keep the rectal area clean.  Apply ice packs to the area to reduce swelling and pain.  Do not drive for 24 hours if you were given a sedative during your procedure.  Keep all follow-up visits as told by your health care provider. This is important. Contact a health care provider if:  Your pain medicine is not helping.  You have a fever or chills.  You have bad smelling drainage.  You have a lot of swelling.  You become constipated.  You have trouble passing urine. Get help right away if:  You have very bad rectal pain.  You have heavy bleeding from your rectum. Summary  After the procedure, it is common to have pain and slight rectal bleeding.  Take warm sitz baths for 15-20 minutes, 2-3 times a day to relieve soreness or itching and to keep the rectal area clean.  Avoid straining when having a bowel movement.  Eat foods that are high in fiber, such as beans,  whole grains, and fresh fruits and vegetables.  Take over-the-counter and prescription medicines only as told by your health care provider. This information is not intended to replace advice given to you by your health care provider. Make sure you discuss any questions you have with your health care provider. Document Revised: 06/16/2018 Document Reviewed: 11/17/2017 Elsevier Patient Education  [place].

## 2019-05-19 ENCOUNTER — Other Ambulatory Visit: Payer: Self-pay | Admitting: Urology

## 2019-05-19 DIAGNOSIS — R350 Frequency of micturition: Secondary | ICD-10-CM

## 2019-05-19 DIAGNOSIS — N5201 Erectile dysfunction due to arterial insufficiency: Secondary | ICD-10-CM

## 2019-05-19 DIAGNOSIS — N401 Enlarged prostate with lower urinary tract symptoms: Secondary | ICD-10-CM

## 2019-06-21 ENCOUNTER — Encounter: Payer: Medicare Other | Admitting: Surgery

## 2019-06-23 ENCOUNTER — Encounter: Payer: Self-pay | Admitting: Surgery

## 2019-06-23 ENCOUNTER — Other Ambulatory Visit: Payer: Self-pay

## 2019-06-23 ENCOUNTER — Ambulatory Visit (INDEPENDENT_AMBULATORY_CARE_PROVIDER_SITE_OTHER): Payer: Self-pay | Admitting: Surgery

## 2019-06-23 VITALS — BP 137/84 | HR 68 | Temp 96.6°F | Resp 12 | Ht 68.5 in | Wt 220.0 lb

## 2019-06-23 DIAGNOSIS — K6289 Other specified diseases of anus and rectum: Secondary | ICD-10-CM | POA: Insufficient documentation

## 2019-06-23 NOTE — Patient Instructions (Addendum)
Please pick up your medication at the pharmacy listed below.   [facility] [address], [place] [address] [phone]  Please see your follow up appointment listed below.

## 2019-06-23 NOTE — Progress Notes (Signed)
[facility] SURGICAL ASSOCIATES POST-OP OFFICE VISIT  06/23/2019  HPI: Curtis Gregory is a 69 y.o. male nearly 3 months s/p hemorrhoidectomy.  He continues to have minimal bleeding with bowel movements.  Reports his bowel movements are soft and twice daily utilizing both MiraLAX and Metamucil.  He still has a pain a 5 on a scale of 10.  Limited to during and shortly after bowel movements.  He does not utilize sitz bath's any further.  Vital signs: BP 137/84   Pulse 68   Temp (!) 96.6 F (35.9 C) (Oral)   Resp 12   Ht 5' 8.5" (1.74 m)   Wt 220 lb (99.8 kg)   SpO2 98%   BMI 32.96 kg/m    Physical Exam: Constitutional: He appears well. Abdomen: Benign soft DRE: He has a real small focus of granular tissue on the right side of the anal area.  I do not appreciate any other nonhealed areas.  There is no areas of fluctuance or induration.  Initial digital exam is tolerated however the deeper sphincteric spasm seems to take over during attempt to a point of aborting, a more thorough distal rectal evaluation. Skin: Clear.  Assessment/Plan: This is a 69 y.o. male nearly 3 months S/p hemorrhoidectomy.  Still with persistent anus spasm associated with bowel movements.  We discussed further evaluation under anesthesia, interventions of Botox or lateral internal sphincterotomy.  He has no interest in any surgical intervention at this time, but like to trial a topical nifedipine.  Patient Active Problem List   Diagnosis Date Noted  . Status post hemorrhoidectomy 05/17/2019  . Chronic pain of right knee 09/08/2018  . Elevated hemoglobin A1c 09/09/2017  . Vitamin D deficiency 09/09/2017  . Benign neoplasm of descending colon   . Seasonal allergies 04/30/2015  . Snoring 10/18/2014  . Torticollis, acute 10/18/2014  . Asthma, mild intermittent 06/16/2014  . Hyperlipidemia 06/16/2014  . Essential hypertension 06/16/2014  . GERD (gastroesophageal reflux disease) 06/16/2014  . Pain in the wrist  06/16/2014  . Apnea, sleep 06/16/2014  . Benign prostatic hyperplasia with urinary frequency 06/16/2014    -Prescription for our nifedipine lidocaine mixture is sent to her local pharmacy and he has been instructed where to pick this up and how to utilize it.  I have offered follow-up in a month to see if this is helpful.  Anal pain anal pain   Ronny Bacon M.D., [facility] 06/23/2019, 12:17 PM

## 2019-07-22 ENCOUNTER — Other Ambulatory Visit: Payer: Self-pay | Admitting: Family Medicine

## 2019-07-22 DIAGNOSIS — K219 Gastro-esophageal reflux disease without esophagitis: Secondary | ICD-10-CM

## 2019-07-26 ENCOUNTER — Encounter: Payer: Self-pay | Admitting: Surgery

## 2019-07-26 ENCOUNTER — Telehealth: Payer: Self-pay | Admitting: Surgery

## 2019-07-26 ENCOUNTER — Ambulatory Visit (INDEPENDENT_AMBULATORY_CARE_PROVIDER_SITE_OTHER): Payer: Medicare Other | Admitting: Surgery

## 2019-07-26 ENCOUNTER — Ambulatory Visit: Payer: Self-pay | Admitting: Surgery

## 2019-07-26 ENCOUNTER — Other Ambulatory Visit: Payer: Self-pay

## 2019-07-26 VITALS — BP 126/81 | HR 60 | Temp 98.1°F | Resp 12 | Ht 68.5 in | Wt 223.0 lb

## 2019-07-26 DIAGNOSIS — K602 Anal fissure, unspecified: Secondary | ICD-10-CM

## 2019-07-26 NOTE — Patient Instructions (Addendum)
Our surgery scheduler will call you to schedule the surgery within 24-48 hours.  Please have the [place] surgery sheet available when speaking with her. Please pick your medication up at [facility].  [phone]   Surgical Procedures for Hemorrhoids, Care After This sheet gives you information about how to care for yourself after your procedure. Your health care provider may also give you more specific instructions. If you have problems or questions, contact your health care provider. What can I expect after the procedure? After the procedure, it is common to have:  Rectal pain.  Pain when you are having a bowel movement.  Slight rectal bleeding. This is more likely to happen with the first bowel movement after surgery. Follow these instructions at home: Medicines  Take over-the-counter and prescription medicines only as told by your health care provider.  If you were prescribed an antibiotic medicine, use it as told by your health care provider. Do not stop using the antibiotic even if your condition improves.  Ask your health care provider if the medicine prescribed to you requires you to avoid driving or using heavy machinery.  Use a stool softener or a bulk laxative as told by your health care provider. Eating and drinking  Follow instructions from your health care provider about what to eat or drink after your procedure.  You may need to take actions to prevent or treat constipation, such as: ? Drink enough fluid to keep your urine pale yellow. ? Take over-the-counter or prescription medicines. ? Eat foods that are high in fiber, such as beans, whole grains, and fresh fruits and vegetables. ? Limit foods that are high in fat and processed sugars, such as fried or sweet foods. Activity   Rest as told by your health care provider.  Avoid sitting for a long time without moving. Get up to take short walks every 1-2 hours. This is important to improve blood flow and breathing.  Ask for help if you feel weak or unsteady.  Return to your normal activities as told by your health care provider. Ask your health care provider what activities are safe for you.  Do not lift anything that is heavier than 10 lb (4.5 kg), or the limit that you are told, until your health care provider says that it is safe.  Do not strain to have a bowel movement.  Do not spend a long time sitting on the toilet. General instructions   Take warm sitz baths for 15-20 minutes, 2-3 times a day to relieve soreness or itching and to keep the rectal area clean.  Apply ice packs to the area to reduce swelling and pain.  Do not drive for 24 hours if you were given a sedative during your procedure.  Keep all follow-up visits as told by your health care provider. This is important. Contact a health care provider if:  Your pain medicine is not helping.  You have a fever or chills.  You have bad smelling drainage.  You have a lot of swelling.  You become constipated.  You have trouble passing urine. Get help right away if:  You have very bad rectal pain.  You have heavy bleeding from your rectum. Summary  After the procedure, it is common to have pain and slight rectal bleeding.  Take warm sitz baths for 15-20 minutes, 2-3 times a day to relieve soreness or itching and to keep the rectal area clean.  Avoid straining when having a bowel movement.  Eat foods that are  high in fiber, such as beans, whole grains, and fresh fruits and vegetables.  Take over-the-counter and prescription medicines only as told by your health care provider. This information is not intended to replace advice given to you by your health care provider. Make sure you discuss any questions you have with your health care provider. Document Revised: 06/16/2018 Document Reviewed: 11/17/2017 Elsevier Patient Education  [place].

## 2019-07-26 NOTE — Telephone Encounter (Signed)
Pt has been advised of Pre-Admission date/time, COVID Testing date and Surgery date.  Surgery Date: 07/29/19 Preadmission Testing Date: 07/27/19 (phone 1p-5p) Covid Testing Date: 07/28/19 - patient advised to go to the [facility] ([place]) between 8a-1p  Patient has been made aware to call [phone], between 1-3:00pm the day before surgery, to find out what time to arrive for surgery.

## 2019-07-26 NOTE — Progress Notes (Signed)
Patient ID: Curtis Gregory, male   DOB: [birth year], 69 y.o.   MRN: [id number]  Chief Complaint: Persisting pain following hemorrhoidectomy.    History of Present Illness Curtis Gregory is a 69 y.o. male with the above, currently 4 months status post hemorrhoidectomy.  He reports he is faithful and continues to use his Metamucil.  He has been utilizing nifedipine ointment which has been helpful but has not allowed complete healing to occur.  Reports his pain is mostly after bowel movements, however his movements are soft and of small caliber.  He reports twice daily movements.  He denies seeing significant bright red blood with bowel movements..  Past Medical History Past Medical History:  Diagnosis Date  . Asthma   . Benign prostatic hypertrophy with urinary frequency 06/16/2014  . Excessive urination at night 06/16/2014  . GERD (gastroesophageal reflux disease)   . Glaucoma   . Hyperlipemia   . Hypertension   . Internal and external thrombosed hemorrhoids 03/15/2019  . Sleep apnea       Past Surgical History:  Procedure Laterality Date  . COLONOSCOPY WITH PROPOFOL N/A 09/11/2016   Procedure: COLONOSCOPY WITH PROPOFOL;  Surgeon: Lucilla Lame, MD;  Location: [place];  Service: Gastroenterology;  Laterality: N/A;  sleep apnea  . HEMORRHOID SURGERY N/A 03/30/2019   Procedure: HEMORRHOIDECTOMY;  Surgeon: Ronny Bacon, MD;  Location: [facility];  Service: General;  Laterality: N/A;  . none    . POLYPECTOMY N/A 09/11/2016   Procedure: POLYPECTOMY;  Surgeon: Lucilla Lame, MD;  Location: [place];  Service: Gastroenterology;  Laterality: N/A;    No Known Allergies  Current Outpatient Medications  Medication Sig Dispense Refill  . albuterol (PROVENTIL HFA;VENTOLIN HFA) 108 (90 BASE) MCG/ACT inhaler Inhale into the lungs. Reported on 06/04/2015    . fexofenadine (ALLEGRA) 180 MG tablet TAKE 1 TABLET BY MOUTH EVERY DAY 90 tablet 2  . finasteride (PROSCAR) 5 MG tablet Take 1  tablet (5 mg total) by mouth daily. 90 tablet 3  . fluticasone (FLONASE) 50 MCG/ACT nasal spray Place 2 sprays into both nostrils daily. Use for 4-6 weeks then stop and use seasonally or as needed. 16 g 3  . hydrochlorothiazide (HYDRODIURIL) 12.5 MG tablet TAKE 1 TABLET BY MOUTH EVERY DAY 90 tablet 1  . hydrocortisone (ANUSOL-HC) 25 MG suppository Place 1 suppository (25 mg total) rectally 2 (two) times daily. For 7 days 14 suppository 2  . ipratropium (ATROVENT) 0.06 % nasal spray USE 2 SPRAYS IN EACH NOSTRIL FOUR TIMES DAILY FOR UP TO 5 TO 7 DAYS THEN STOP 15 mL 0  . latanoprost (XALATAN) 0.005 % ophthalmic solution INSTILL ONE DROP IN BOTH EYES QHS.  0  . meloxicam (MOBIC) 15 MG tablet Take 1 tablet (15 mg total) by mouth daily as needed for pain. 30 tablet 2  . omeprazole (PRILOSEC) 20 MG capsule TAKE 1 CAPSULE BY MOUTH EVERY DAY 90 capsule 1  . rosuvastatin (CRESTOR) 20 MG tablet TAKE 1 TABLET BY MOUTH EVERYDAY AT BEDTIME 90 tablet 1  . sildenafil (REVATIO) 20 MG tablet TAKE 2-5 TABLETS BY MOUTH 1 HOUR PRIOR TO INTERCOURSE 30 tablet 1  . tamsulosin (FLOMAX) 0.4 MG CAPS capsule Take 1 capsule (0.4 mg total) by mouth daily after breakfast. 90 capsule 3  . timolol (TIMOPTIC-XR) 0.5 % ophthalmic gel-forming INT 1 GTT INTO OS QD  0   No current facility-administered medications for this visit.    Family History Family History  Problem Relation Age of Onset  .  Diabetes Mother   . Heart disease Mother   . Alzheimer's disease Father   . Prostate cancer Father   . Prostate cancer Brother   . Kidney cancer Neg Hx       Social History Social History   Tobacco Use  . Smoking status: Never Smoker  . Smokeless tobacco: Never Used  Vaping Use  . Vaping Use: Never used  Substance Use Topics  . Alcohol use: Yes    Alcohol/week: 1.0 standard drink    Types: 1 Glasses of wine per week    Comment: occasional  . Drug use: No        Review of Systems  Constitutional: Negative.   HENT:  Negative.   Eyes: Negative.   Respiratory: Negative.   Cardiovascular: Negative.   Gastrointestinal: Negative.   Genitourinary: Negative.   Skin: Negative.   Neurological: Negative.   Psychiatric/Behavioral: Negative.       Physical Exam Blood pressure 126/81, pulse 60, temperature 98.1 F (36.7 C), temperature source Oral, resp. rate 12, height 5' 8.5" (1.74 m), weight 223 lb (101.2 kg), SpO2 97 %. Last Weight  Most recent update: 07/26/2019  9:19 AM   Weight  101.2 kg (223 lb)            CONSTITUTIONAL: Well developed, and nourished, appropriately responsive and aware without distress.   EYES: Sclera non-icteric.   EARS, NOSE, MOUTH AND THROAT: Mask worn.    Hearing is intact to voice.  NECK: Trachea is midline, and there is no jugular venous distension.  LYMPH NODES:  Lymph nodes in the neck are not enlarged. RESPIRATORY:  Lungs are clear, and breath sounds are equal bilaterally. Normal respiratory effort without pathologic use of accessory muscles. CARDIOVASCULAR: Heart is regular in rate and rhythm. GI: The abdomen is soft, nontender, and nondistended. There were no palpable masses. I did not appreciate hepatosplenomegaly. There were normal bowel sounds. GU: On digital rectal exam he remains tender, I believe at 12:00.  I sense there is a fissure at this site, his pain is significant on digital exam despite being cautious.  I believe there is a hypertrophic internal sphincter present.  But no adjacent induration, inflammation or fluctuance present. MUSCULOSKELETAL:  Symmetrical muscle tone appreciated in all four extremities.    SKIN: Skin turgor is normal. No pathologic skin lesions appreciated.  NEUROLOGIC:  Motor and sensation appear grossly normal.  Cranial nerves are grossly without defect. PSYCH:  Alert and oriented to person, place and time. Affect is appropriate for situation.  Data Reviewed I have personally reviewed what is currently available of the patient's  imaging, recent labs and medical records.   Labs:  CBC Latest Ref Rng & Units 03/24/2019 09/08/2017 05/22/2016  WBC 4.0 - 10.5 K/uL 4.8 12.2(H) 4.1  Hemoglobin 13.0 - 17.0 g/dL 14.8 15.1 14.7  Hematocrit 39 - 52 % 44.6 45.0 44.4  Platelets 150 - 400 K/uL 268 306 225   CMP Latest Ref Rng & Units 03/24/2019 09/08/2017 05/22/2016  Glucose 70 - 99 mg/dL 108(H) 101(H) 96  BUN 8 - 23 mg/dL 25(H) 19 17  Creatinine 0.61 - 1.24 mg/dL 1.13 1.08 1.15  Sodium 135 - 145 mmol/L 141 139 141  Potassium 3.5 - 5.1 mmol/L 3.5 4.1 4.4  Chloride 98 - 111 mmol/L 106 102 107  CO2 22 - 32 mmol/L 27 29 25   Calcium 8.9 - 10.3 mg/dL 8.7(L) 8.7 8.3(L)  Total Protein 6.5 - 8.1 g/dL 6.9 6.9 6.2  Total Bilirubin 0.3 -  1.2 mg/dL 0.9 0.6 0.7  Alkaline Phos 38 - 126 U/L 48 - 53  AST 15 - 41 U/L 20 15 21   ALT 0 - 44 U/L 14 17 13       Imaging:  Within last 24 hrs: No results found.  Assessment    Postoperative anal fissure. Patient Active Problem List   Diagnosis Date Noted  . Rectal or anal pain 06/23/2019  . Status post hemorrhoidectomy 05/17/2019  . Chronic pain of right knee 09/08/2018  . Elevated hemoglobin A1c 09/09/2017  . Vitamin D deficiency 09/09/2017  . Benign neoplasm of descending colon   . Seasonal allergies 04/30/2015  . Snoring 10/18/2014  . Torticollis, acute 10/18/2014  . Asthma, mild intermittent 06/16/2014  . Hyperlipidemia 06/16/2014  . Essential hypertension 06/16/2014  . GERD (gastroesophageal reflux disease) 06/16/2014  . Pain in the wrist 06/16/2014  . Apnea, sleep 06/16/2014  . Benign prostatic hyperplasia with urinary frequency 06/16/2014    Plan    We discussed options of lateral internal sphincterotomy versus Botox injection.  He prefers the Botox injection and avoiding further surgery.  We discussed the general anesthetic that would be involved with this.   He is desiring to try this, not wanting to have any more surgery to heal from.  I instructed it may require a 2nd  injection to obtain the results desired, but will preferably defer any type of surgical incision which seems he wishes to avoid at this time. We discussed the risks of sphincterotomy including diminished control, loss of sensation, infection, anesthesia etc.  Face-to-face time spent with the patient and accompanying care providers(if present) was 30 minutes, with more than 50% of the time spent counseling, educating, and coordinating care of the patient.      Ronny Bacon M.D., FACS 07/26/2019, 10:07 AM

## 2019-07-26 NOTE — H&P (View-Only) (Signed)
Patient ID: Curtis Gregory, male   DOB: [birth year], 69 y.o.   MRN: [id number]  Chief Complaint: Persisting pain following hemorrhoidectomy.    History of Present Illness Curtis Gregory is a 69 y.o. male with the above, currently 4 months status post hemorrhoidectomy.  He reports he is faithful and continues to use his Metamucil.  He has been utilizing nifedipine ointment which has been helpful but has not allowed complete healing to occur.  Reports his pain is mostly after bowel movements, however his movements are soft and of small caliber.  He reports twice daily movements.  He denies seeing significant bright red blood with bowel movements..  Past Medical History Past Medical History:  Diagnosis Date  . Asthma   . Benign prostatic hypertrophy with urinary frequency 06/16/2014  . Excessive urination at night 06/16/2014  . GERD (gastroesophageal reflux disease)   . Glaucoma   . Hyperlipemia   . Hypertension   . Internal and external thrombosed hemorrhoids 03/15/2019  . Sleep apnea       Past Surgical History:  Procedure Laterality Date  . COLONOSCOPY WITH PROPOFOL N/A 09/11/2016   Procedure: COLONOSCOPY WITH PROPOFOL;  Surgeon: Lucilla Lame, MD;  Location: [place];  Service: Gastroenterology;  Laterality: N/A;  sleep apnea  . HEMORRHOID SURGERY N/A 03/30/2019   Procedure: HEMORRHOIDECTOMY;  Surgeon: Ronny Bacon, MD;  Location: [facility];  Service: General;  Laterality: N/A;  . none    . POLYPECTOMY N/A 09/11/2016   Procedure: POLYPECTOMY;  Surgeon: Lucilla Lame, MD;  Location: [place];  Service: Gastroenterology;  Laterality: N/A;    No Known Allergies  Current Outpatient Medications  Medication Sig Dispense Refill  . albuterol (PROVENTIL HFA;VENTOLIN HFA) 108 (90 BASE) MCG/ACT inhaler Inhale into the lungs. Reported on 06/04/2015    . fexofenadine (ALLEGRA) 180 MG tablet TAKE 1 TABLET BY MOUTH EVERY DAY 90 tablet 2  . finasteride (PROSCAR) 5 MG tablet Take 1  tablet (5 mg total) by mouth daily. 90 tablet 3  . fluticasone (FLONASE) 50 MCG/ACT nasal spray Place 2 sprays into both nostrils daily. Use for 4-6 weeks then stop and use seasonally or as needed. 16 g 3  . hydrochlorothiazide (HYDRODIURIL) 12.5 MG tablet TAKE 1 TABLET BY MOUTH EVERY DAY 90 tablet 1  . hydrocortisone (ANUSOL-HC) 25 MG suppository Place 1 suppository (25 mg total) rectally 2 (two) times daily. For 7 days 14 suppository 2  . ipratropium (ATROVENT) 0.06 % nasal spray USE 2 SPRAYS IN EACH NOSTRIL FOUR TIMES DAILY FOR UP TO 5 TO 7 DAYS THEN STOP 15 mL 0  . latanoprost (XALATAN) 0.005 % ophthalmic solution INSTILL ONE DROP IN BOTH EYES QHS.  0  . meloxicam (MOBIC) 15 MG tablet Take 1 tablet (15 mg total) by mouth daily as needed for pain. 30 tablet 2  . omeprazole (PRILOSEC) 20 MG capsule TAKE 1 CAPSULE BY MOUTH EVERY DAY 90 capsule 1  . rosuvastatin (CRESTOR) 20 MG tablet TAKE 1 TABLET BY MOUTH EVERYDAY AT BEDTIME 90 tablet 1  . sildenafil (REVATIO) 20 MG tablet TAKE 2-5 TABLETS BY MOUTH 1 HOUR PRIOR TO INTERCOURSE 30 tablet 1  . tamsulosin (FLOMAX) 0.4 MG CAPS capsule Take 1 capsule (0.4 mg total) by mouth daily after breakfast. 90 capsule 3  . timolol (TIMOPTIC-XR) 0.5 % ophthalmic gel-forming INT 1 GTT INTO OS QD  0   No current facility-administered medications for this visit.    Family History Family History  Problem Relation Age of Onset  .  Diabetes Mother   . Heart disease Mother   . Alzheimer's disease Father   . Prostate cancer Father   . Prostate cancer Brother   . Kidney cancer Neg Hx       Social History Social History   Tobacco Use  . Smoking status: Never Smoker  . Smokeless tobacco: Never Used  Vaping Use  . Vaping Use: Never used  Substance Use Topics  . Alcohol use: Yes    Alcohol/week: 1.0 standard drink    Types: 1 Glasses of wine per week    Comment: occasional  . Drug use: No        Review of Systems  Constitutional: Negative.   HENT:  Negative.   Eyes: Negative.   Respiratory: Negative.   Cardiovascular: Negative.   Gastrointestinal: Negative.   Genitourinary: Negative.   Skin: Negative.   Neurological: Negative.   Psychiatric/Behavioral: Negative.       Physical Exam Blood pressure 126/81, pulse 60, temperature 98.1 F (36.7 C), temperature source Oral, resp. rate 12, height 5' 8.5" (1.74 m), weight 223 lb (101.2 kg), SpO2 97 %. Last Weight  Most recent update: 07/26/2019  9:19 AM   Weight  101.2 kg (223 lb)            CONSTITUTIONAL: Well developed, and nourished, appropriately responsive and aware without distress.   EYES: Sclera non-icteric.   EARS, NOSE, MOUTH AND THROAT: Mask worn.    Hearing is intact to voice.  NECK: Trachea is midline, and there is no jugular venous distension.  LYMPH NODES:  Lymph nodes in the neck are not enlarged. RESPIRATORY:  Lungs are clear, and breath sounds are equal bilaterally. Normal respiratory effort without pathologic use of accessory muscles. CARDIOVASCULAR: Heart is regular in rate and rhythm. GI: The abdomen is soft, nontender, and nondistended. There were no palpable masses. I did not appreciate hepatosplenomegaly. There were normal bowel sounds. GU: On digital rectal exam he remains tender, I believe at 12:00.  I sense there is a fissure at this site, his pain is significant on digital exam despite being cautious.  I believe there is a hypertrophic internal sphincter present.  But no adjacent induration, inflammation or fluctuance present. MUSCULOSKELETAL:  Symmetrical muscle tone appreciated in all four extremities.    SKIN: Skin turgor is normal. No pathologic skin lesions appreciated.  NEUROLOGIC:  Motor and sensation appear grossly normal.  Cranial nerves are grossly without defect. PSYCH:  Alert and oriented to person, place and time. Affect is appropriate for situation.  Data Reviewed I have personally reviewed what is currently available of the patient's  imaging, recent labs and medical records.   Labs:  CBC Latest Ref Rng & Units 03/24/2019 09/08/2017 05/22/2016  WBC 4.0 - 10.5 K/uL 4.8 12.2(H) 4.1  Hemoglobin 13.0 - 17.0 g/dL 14.8 15.1 14.7  Hematocrit 39 - 52 % 44.6 45.0 44.4  Platelets 150 - 400 K/uL 268 306 225   CMP Latest Ref Rng & Units 03/24/2019 09/08/2017 05/22/2016  Glucose 70 - 99 mg/dL 108(H) 101(H) 96  BUN 8 - 23 mg/dL 25(H) 19 17  Creatinine 0.61 - 1.24 mg/dL 1.13 1.08 1.15  Sodium 135 - 145 mmol/L 141 139 141  Potassium 3.5 - 5.1 mmol/L 3.5 4.1 4.4  Chloride 98 - 111 mmol/L 106 102 107  CO2 22 - 32 mmol/L 27 29 25   Calcium 8.9 - 10.3 mg/dL 8.7(L) 8.7 8.3(L)  Total Protein 6.5 - 8.1 g/dL 6.9 6.9 6.2  Total Bilirubin 0.3 -  1.2 mg/dL 0.9 0.6 0.7  Alkaline Phos 38 - 126 U/L 48 - 53  AST 15 - 41 U/L 20 15 21   ALT 0 - 44 U/L 14 17 13       Imaging:  Within last 24 hrs: No results found.  Assessment    Postoperative anal fissure. Patient Active Problem List   Diagnosis Date Noted  . Rectal or anal pain 06/23/2019  . Status post hemorrhoidectomy 05/17/2019  . Chronic pain of right knee 09/08/2018  . Elevated hemoglobin A1c 09/09/2017  . Vitamin D deficiency 09/09/2017  . Benign neoplasm of descending colon   . Seasonal allergies 04/30/2015  . Snoring 10/18/2014  . Torticollis, acute 10/18/2014  . Asthma, mild intermittent 06/16/2014  . Hyperlipidemia 06/16/2014  . Essential hypertension 06/16/2014  . GERD (gastroesophageal reflux disease) 06/16/2014  . Pain in the wrist 06/16/2014  . Apnea, sleep 06/16/2014  . Benign prostatic hyperplasia with urinary frequency 06/16/2014    Plan    We discussed options of lateral internal sphincterotomy versus Botox injection.  He prefers the Botox injection and avoiding further surgery.  We discussed the general anesthetic that would be involved with this.   He is desiring to try this, not wanting to have any more surgery to heal from.  I instructed it may require a 2nd  injection to obtain the results desired, but will preferably defer any type of surgical incision which seems he wishes to avoid at this time. We discussed the risks of sphincterotomy including diminished control, loss of sensation, infection, anesthesia etc.  Face-to-face time spent with the patient and accompanying care providers(if present) was 30 minutes, with more than 50% of the time spent counseling, educating, and coordinating care of the patient.      Ronny Bacon M.D., FACS 07/26/2019, 10:07 AM

## 2019-07-27 ENCOUNTER — Other Ambulatory Visit: Payer: Self-pay

## 2019-07-27 ENCOUNTER — Encounter
Admission: RE | Admit: 2019-07-27 | Discharge: 2019-07-27 | Disposition: A | Discharge status: Home / Self Care | Payer: Medicare Other | Source: Ambulatory Visit | Attending: Surgery | Admitting: Surgery

## 2019-07-27 ENCOUNTER — Other Ambulatory Visit: Payer: Medicare Other

## 2019-07-27 NOTE — Patient Instructions (Addendum)
Your procedure is scheduled on: 07-29-19 FRIDAY Report to Same Day Surgery 2nd floor medical mall [facility] Entrance-take elevator on left to 2nd floor.  Check in with surgery information desk.) To find out your arrival time please call [phone] between 1PM - 3PM on 07-28-19 THURSDAY  Remember: Instructions that are not followed completely may result in serious medical risk, up to and including death, or upon the discretion of your surgeon and anesthesiologist your surgery may need to be rescheduled.    _x___ 1. Do not eat food after midnight the night before your procedure. NO GUM OR CANDY AFTER MIDNIGHT. You may drink clear liquids up to 2 hours before you are scheduled to arrive at the hospital for your procedure.  Do not drink clear liquids within 2 hours of your scheduled arrival to the hospital.  Clear liquids include  --Water or Apple juice without pulp  --Gatorade  --Black Coffee or Clear Tea (No milk, no creamers, do not add anything to the coffee or Tea-ok to add sugar)     __x__ 2. No Alcohol for 24 hours before or after surgery.   __x__3. No Smoking or e-cigarettes for 24 prior to surgery.  Do not use any chewable tobacco products for at least 6 hour prior to surgery   ____  4. Bring all medications with you on the day of surgery if instructed.    __x__ 5. Notify your doctor if there is any change in your medical condition     (cold, fever, infections).    x___6. On the morning of surgery brush your teeth with toothpaste and water.  You may rinse your mouth with mouth wash if you wish.  Do not swallow any toothpaste or mouthwash.   Do not wear jewelry, make-up, hairpins, clips or nail polish.  Do not wear lotions, powders, or perfumes. You may wear deodorant.  Do not shave 48 hours prior to surgery. Men may shave face and neck.  Do not bring valuables to the hospital.    [facility] is not responsible for any belongings or valuables.               Contacts,  dentures or bridgework may not be worn into surgery.  Leave your suitcase in the car. After surgery it may be brought to your room.  For patients admitted to the hospital, discharge time is determined by your  treatment team.  _  Patients discharged the day of surgery will not be allowed to drive home.  You will need someone to drive you home and stay with you the night of your procedure.    Please read over the following fact sheets that you were given:   [facility] Preparing for Surgery  _x___ TAKE THE FOLLOWING MEDICATION THE MORNING OF SURGERY WITH A SMALL SIP OF WATER. These include:  1. ALLEGRA (FEXOFENADINE)  2. PROSCAR (FINASTERIDE)  3. FLOMAX (TAMSULOSIN)  4. PRILOSEC (OMEPRAZOLE)  5. TAKE AN EXTRA PRILOSEC THURSDAY NIGHT BEFORE BED  6.  ____Fleets enema or Magnesium Citrate as directed.   ____ Use CHG Soap or sage wipes as directed on instruction sheet   _X___ Use inhalers on the day of surgery and bring to hospital day of surgery  ____ Stop Metformin and Janumet 2 days prior to surgery.    ____ Take 1/2 of usual insulin dose the night before surgery and none on the morning surgery.   ____ Follow recommendations from Cardiologist, Pulmonologist or PCP regarding stopping Aspirin, Coumadin,  Plavix ,Eliquis, Effient, or Pradaxa, and Pletal.  X____Stop Anti-inflammatories such as Advil, Aleve, Ibuprofen, Motrin, Naproxen, MOBIC (MELOXICAM) Naprosyn, Goodies powders or aspirin products NOW-OK to take Tylenol    ____ Stop supplements until after surgery.   _X___ Bring C-Pap to the hospital.

## 2019-07-28 ENCOUNTER — Other Ambulatory Visit
Admission: RE | Admit: 2019-07-28 | Discharge: 2019-07-28 | Disposition: A | Discharge status: Home / Self Care | Payer: Medicare Other | Source: Ambulatory Visit | Attending: Surgery | Admitting: Surgery

## 2019-07-28 DIAGNOSIS — Z20822 Contact with and (suspected) exposure to covid-19: Secondary | ICD-10-CM | POA: Insufficient documentation

## 2019-07-28 DIAGNOSIS — Z01812 Encounter for preprocedural laboratory examination: Secondary | ICD-10-CM | POA: Diagnosis not present

## 2019-07-28 LAB — COMPREHENSIVE METABOLIC PANEL
ALT: 15 U/L (ref 0–44)
AST: 25 U/L (ref 15–41)
Albumin: 3.9 g/dL (ref 3.5–5.0)
Alkaline Phosphatase: 51 U/L (ref 38–126)
Anion gap: 6 (ref 5–15)
BUN: 16 mg/dL (ref 8–23)
CO2: 27 mmol/L (ref 22–32)
Calcium: 8.7 mg/dL — ABNORMAL LOW (ref 8.9–10.3)
Chloride: 104 mmol/L (ref 98–111)
Creatinine, Ser: 1.16 mg/dL (ref 0.61–1.24)
GFR calc Af Amer: >60 mL/min (ref >60)
GFR calc non Af Amer: >60 mL/min (ref >60)
Glucose, Bld: 133 mg/dL — ABNORMAL HIGH (ref 70–99)
Potassium: 3.6 mmol/L (ref 3.5–5.1)
Sodium: 137 mmol/L (ref 135–145)
Total Bilirubin: 1 mg/dL (ref 0.3–1.2)
Total Protein: 7.3 g/dL (ref 6.5–8.1)

## 2019-07-28 LAB — CBC WITH DIFFERENTIAL/PLATELET
Abs Immature Granulocytes: 0.02 10*3/uL (ref 0.00–0.07)
Basophils Absolute: 0.1 10*3/uL (ref 0.0–0.1)
Basophils Relative: 1 %
Eosinophils Absolute: 0.2 10*3/uL (ref 0.0–0.5)
Eosinophils Relative: 4 %
HCT: 44.6 % (ref 39.0–52.0)
Hemoglobin: 15 g/dL (ref 13.0–17.0)
Immature Granulocytes: 1 %
Lymphocytes Relative: 45 %
Lymphs Abs: 1.9 10*3/uL (ref 0.7–4.0)
MCH: 30.2 pg (ref 26.0–34.0)
MCHC: 33.6 g/dL (ref 30.0–36.0)
MCV: 89.9 fL (ref 80.0–100.0)
Monocytes Absolute: 0.4 10*3/uL (ref 0.1–1.0)
Monocytes Relative: 9 %
Neutro Abs: 1.7 10*3/uL (ref 1.7–7.7)
Neutrophils Relative %: 40 %
Platelets: 251 10*3/uL (ref 150–400)
RBC: 4.96 MIL/uL (ref 4.22–5.81)
RDW: 12.6 % (ref 11.5–15.5)
WBC: 4.3 10*3/uL (ref 4.0–10.5)
nRBC: 0 % (ref 0.0–0.2)

## 2019-07-28 LAB — SARS CORONAVIRUS 2 (TAT 6-24 HRS): SARS Coronavirus 2: NEGATIVE

## 2019-08-01 ENCOUNTER — Telehealth: Payer: Self-pay | Admitting: Surgery

## 2019-08-01 NOTE — Telephone Encounter (Signed)
Updated information regarding surgery.  Pt has been advised of Pre-Admission date/time, COVID Testing date and Surgery date.  Surgery Date: 08/05/19 Preadmission Testing Date: 07/27/19 (phone done) Covid Testing Date: 08/03/19 - patient advised to go to the [place] ([place] between 8a-1p   Patient has been made aware to call [phone], between 1-3:00pm the day before surgery, to find out what time to arrive for surgery.

## 2019-08-03 ENCOUNTER — Other Ambulatory Visit: Payer: Self-pay

## 2019-08-03 ENCOUNTER — Other Ambulatory Visit
Admission: RE | Admit: 2019-08-03 | Discharge: 2019-08-03 | Disposition: A | Discharge status: Home / Self Care | Payer: Medicare Other | Source: Ambulatory Visit | Attending: Surgery | Admitting: Surgery

## 2019-08-03 DIAGNOSIS — Z01812 Encounter for preprocedural laboratory examination: Secondary | ICD-10-CM | POA: Diagnosis not present

## 2019-08-03 DIAGNOSIS — Z20822 Contact with and (suspected) exposure to covid-19: Secondary | ICD-10-CM | POA: Insufficient documentation

## 2019-08-03 LAB — SARS CORONAVIRUS 2 (TAT 6-24 HRS): SARS Coronavirus 2: NEGATIVE

## 2019-08-05 ENCOUNTER — Ambulatory Visit: Payer: Medicare Other | Admitting: Anesthesiology

## 2019-08-05 ENCOUNTER — Ambulatory Visit
Admission: RE | Admit: 2019-08-05 | Discharge: 2019-08-05 | Disposition: A | Discharge status: Home / Self Care | Payer: Medicare Other | Attending: Surgery | Admitting: Surgery

## 2019-08-05 ENCOUNTER — Encounter
Admission: RE | Disposition: A | Discharge status: Home / Self Care | Payer: Self-pay | Source: Home / Self Care | Attending: Surgery

## 2019-08-05 ENCOUNTER — Other Ambulatory Visit: Payer: Self-pay

## 2019-08-05 DIAGNOSIS — G4730 Sleep apnea, unspecified: Secondary | ICD-10-CM | POA: Insufficient documentation

## 2019-08-05 DIAGNOSIS — M25561 Pain in right knee: Secondary | ICD-10-CM | POA: Diagnosis not present

## 2019-08-05 DIAGNOSIS — T8189XA Other complications of procedures, not elsewhere classified, initial encounter: Secondary | ICD-10-CM | POA: Diagnosis not present

## 2019-08-05 DIAGNOSIS — G8929 Other chronic pain: Secondary | ICD-10-CM | POA: Insufficient documentation

## 2019-08-05 DIAGNOSIS — K594 Anal spasm: Secondary | ICD-10-CM | POA: Diagnosis not present

## 2019-08-05 DIAGNOSIS — K602 Anal fissure, unspecified: Secondary | ICD-10-CM | POA: Insufficient documentation

## 2019-08-05 DIAGNOSIS — R350 Frequency of micturition: Secondary | ICD-10-CM | POA: Diagnosis not present

## 2019-08-05 DIAGNOSIS — Y838 Other surgical procedures as the cause of abnormal reaction of the patient, or of later complication, without mention of misadventure at the time of the procedure: Secondary | ICD-10-CM | POA: Insufficient documentation

## 2019-08-05 DIAGNOSIS — H409 Unspecified glaucoma: Secondary | ICD-10-CM | POA: Insufficient documentation

## 2019-08-05 DIAGNOSIS — N401 Enlarged prostate with lower urinary tract symptoms: Secondary | ICD-10-CM | POA: Diagnosis not present

## 2019-08-05 DIAGNOSIS — J4520 Mild intermittent asthma, uncomplicated: Secondary | ICD-10-CM | POA: Insufficient documentation

## 2019-08-05 DIAGNOSIS — K219 Gastro-esophageal reflux disease without esophagitis: Secondary | ICD-10-CM | POA: Diagnosis not present

## 2019-08-05 DIAGNOSIS — Z791 Long term (current) use of non-steroidal anti-inflammatories (NSAID): Secondary | ICD-10-CM | POA: Insufficient documentation

## 2019-08-05 DIAGNOSIS — I10 Essential (primary) hypertension: Secondary | ICD-10-CM | POA: Insufficient documentation

## 2019-08-05 DIAGNOSIS — Z79899 Other long term (current) drug therapy: Secondary | ICD-10-CM | POA: Insufficient documentation

## 2019-08-05 DIAGNOSIS — E785 Hyperlipidemia, unspecified: Secondary | ICD-10-CM | POA: Diagnosis not present

## 2019-08-05 HISTORY — PX: BOTOX INJECTION: SHX5754

## 2019-08-05 HISTORY — PX: SPHINCTEROTOMY: SHX5279

## 2019-08-05 SURGERY — BOTOX INJECTION
Anesthesia: General

## 2019-08-05 MED ORDER — CELECOXIB 200 MG PO CAPS
ORAL_CAPSULE | ORAL | Status: AC
  Administered 2019-08-05: 200 mg via ORAL
  Filled 2019-08-05: qty 1

## 2019-08-05 MED ORDER — LACTATED RINGERS IV SOLN: INTRAVENOUS | Status: DC

## 2019-08-05 MED ORDER — FENTANYL CITRATE (PF) 100 MCG/2ML IJ SOLN: 25.0000 ug | INTRAMUSCULAR | Status: DC | PRN

## 2019-08-05 MED ORDER — MIDAZOLAM HCL 2 MG/2ML IJ SOLN
INTRAMUSCULAR | Status: AC
  Filled 2019-08-05: qty 2

## 2019-08-05 MED ORDER — DIBUCAINE (PERIANAL) 1 % EX OINT
TOPICAL_OINTMENT | CUTANEOUS | Status: DC | PRN
  Administered 2019-08-05: 1 via RECTAL

## 2019-08-05 MED ORDER — PROPOFOL 10 MG/ML IV BOLUS
INTRAVENOUS | Status: DC | PRN
  Administered 2019-08-05: 150 mg via INTRAVENOUS

## 2019-08-05 MED ORDER — ONABOTULINUMTOXINA 100 UNITS IJ SOLR
INTRAMUSCULAR | Status: DC | PRN
  Administered 2019-08-05: 100 [IU] via INTRAMUSCULAR

## 2019-08-05 MED ORDER — CHLORHEXIDINE GLUCONATE 0.12 % MT SOLN: 15.0000 mL | Freq: Once | OROMUCOSAL | Status: AC

## 2019-08-05 MED ORDER — ONDANSETRON HCL 4 MG/2ML IJ SOLN
INTRAMUSCULAR | Status: AC
  Filled 2019-08-05: qty 2

## 2019-08-05 MED ORDER — DIBUCAINE (PERIANAL) 1 % EX OINT
TOPICAL_OINTMENT | CUTANEOUS | Status: AC
  Filled 2019-08-05: qty 28

## 2019-08-05 MED ORDER — PROPOFOL 500 MG/50ML IV EMUL
INTRAVENOUS | Status: AC
  Filled 2019-08-05: qty 50

## 2019-08-05 MED ORDER — ONDANSETRON HCL 4 MG/2ML IJ SOLN: 4.0000 mg | Freq: Once | INTRAMUSCULAR | Status: DC | PRN

## 2019-08-05 MED ORDER — MIDAZOLAM HCL 2 MG/2ML IJ SOLN
INTRAMUSCULAR | Status: DC | PRN
  Administered 2019-08-05 (×2): 1 mg via INTRAVENOUS

## 2019-08-05 MED ORDER — LIDOCAINE HCL (CARDIAC) PF 100 MG/5ML IV SOSY
PREFILLED_SYRINGE | INTRAVENOUS | Status: DC | PRN
  Administered 2019-08-05: 100 mg via INTRAVENOUS

## 2019-08-05 MED ORDER — CHLORHEXIDINE GLUCONATE CLOTH 2 % EX PADS: 6.0000 | MEDICATED_PAD | Freq: Once | CUTANEOUS | Status: DC

## 2019-08-05 MED ORDER — LIDOCAINE HCL (PF) 2 % IJ SOLN
INTRAMUSCULAR | Status: AC
  Filled 2019-08-05: qty 5

## 2019-08-05 MED ORDER — GELATIN ABSORBABLE 100 CM EX MISC
CUTANEOUS | Status: AC
  Filled 2019-08-05: qty 1

## 2019-08-05 MED ORDER — ACETAMINOPHEN 500 MG PO TABS: 1000.0000 mg | ORAL_TABLET | ORAL | Status: AC

## 2019-08-05 MED ORDER — CELECOXIB 200 MG PO CAPS: 200.0000 mg | ORAL_CAPSULE | ORAL | Status: AC

## 2019-08-05 MED ORDER — HYDROCODONE-ACETAMINOPHEN 5-325 MG PO TABS
ORAL_TABLET | ORAL | Status: AC
  Filled 2019-08-05: qty 1

## 2019-08-05 MED ORDER — ONDANSETRON HCL 4 MG/2ML IJ SOLN
INTRAMUSCULAR | Status: DC | PRN
  Administered 2019-08-05: 4 mg via INTRAVENOUS

## 2019-08-05 MED ORDER — CHLORHEXIDINE GLUCONATE 0.12 % MT SOLN
OROMUCOSAL | Status: AC
  Administered 2019-08-05: 15 mL via OROMUCOSAL
  Filled 2019-08-05: qty 15

## 2019-08-05 MED ORDER — HYDROCODONE-ACETAMINOPHEN 5-325 MG PO TABS
1.0000 | ORAL_TABLET | Freq: Four times a day (QID) | ORAL | 0 refills | Status: DC | PRN
Start: 2019-08-05 — End: 2019-08-23

## 2019-08-05 MED ORDER — FENTANYL CITRATE (PF) 100 MCG/2ML IJ SOLN
INTRAMUSCULAR | Status: AC
  Filled 2019-08-05: qty 2

## 2019-08-05 MED ORDER — SODIUM CHLORIDE FLUSH 0.9 % IV SOLN
INTRAVENOUS | Status: AC
  Filled 2019-08-05: qty 10

## 2019-08-05 MED ORDER — ORAL CARE MOUTH RINSE: 15.0000 mL | Freq: Once | OROMUCOSAL | Status: AC

## 2019-08-05 MED ORDER — DEXAMETHASONE SODIUM PHOSPHATE 10 MG/ML IJ SOLN
INTRAMUSCULAR | Status: DC | PRN
  Administered 2019-08-05: 4 mg via INTRAVENOUS

## 2019-08-05 MED ORDER — ACETAMINOPHEN 500 MG PO TABS
ORAL_TABLET | ORAL | Status: AC
  Administered 2019-08-05: 1000 mg via ORAL
  Filled 2019-08-05: qty 2

## 2019-08-05 MED ORDER — HYDROCODONE-ACETAMINOPHEN 5-325 MG PO TABS
1.0000 | ORAL_TABLET | Freq: Four times a day (QID) | ORAL | Status: DC | PRN
  Administered 2019-08-05: 1 via ORAL

## 2019-08-05 MED ORDER — FENTANYL CITRATE (PF) 100 MCG/2ML IJ SOLN
INTRAMUSCULAR | Status: DC | PRN
  Administered 2019-08-05: 25 ug via INTRAVENOUS
  Administered 2019-08-05: 50 ug via INTRAVENOUS

## 2019-08-05 MED ORDER — GELATIN ABSORBABLE 12-7 MM EX MISC
CUTANEOUS | Status: AC
  Filled 2019-08-05: qty 1

## 2019-08-05 MED ORDER — DEXAMETHASONE SODIUM PHOSPHATE 10 MG/ML IJ SOLN
INTRAMUSCULAR | Status: AC
  Filled 2019-08-05: qty 1

## 2019-08-05 SURGICAL SUPPLY — 29 items
BLADE SURG 15 STRL LF DISP TIS (BLADE) ×1 IMPLANT
BLADE SURG 15 STRL SS (BLADE) ×2
BRIEF STRETCH MATERNITY 2XLG (MISCELLANEOUS) IMPLANT
CANISTER SUCT 1200ML W/VALVE (MISCELLANEOUS) ×2 IMPLANT
COVER WAND RF STERILE (DRAPES) IMPLANT
DRAPE LAPAROTOMY 100X77 ABD (DRAPES) ×2 IMPLANT
DRSG GAUZE FLUFF 36X18 (GAUZE/BANDAGES/DRESSINGS) IMPLANT
ELECT CAUTERY BLADE TIP 2.5 (TIP) ×2
ELECT REM PT RETURN 9FT ADLT (ELECTROSURGICAL) ×2
ELECTRODE CAUTERY BLDE TIP 2.5 (TIP) ×1 IMPLANT
ELECTRODE REM PT RTRN 9FT ADLT (ELECTROSURGICAL) ×1 IMPLANT
GAUZE SPONGE 4X4 12PLY STRL (GAUZE/BANDAGES/DRESSINGS) ×2 IMPLANT
GLOVE BIOGEL PI IND STRL 7.0 (GLOVE) ×1 IMPLANT
GLOVE BIOGEL PI INDICATOR 7.0 (GLOVE) ×1
GLOVE ORTHO TXT STRL SZ7.5 (GLOVE) ×2 IMPLANT
GOWN STRL REUS W/ TWL LRG LVL3 (GOWN DISPOSABLE) ×3 IMPLANT
GOWN STRL REUS W/TWL LRG LVL3 (GOWN DISPOSABLE) ×6
KIT TURNOVER KIT A (KITS) ×2 IMPLANT
NEEDLE HYPO 22GX1.5 SAFETY (NEEDLE) ×2 IMPLANT
PACK BASIN MINOR (MISCELLANEOUS) ×2 IMPLANT
SOL PREP PVP 2OZ (MISCELLANEOUS) ×2
SOLUTION PREP PVP 2OZ (MISCELLANEOUS) ×1 IMPLANT
SURGILUBE 2OZ TUBE FLIPTOP (MISCELLANEOUS) ×2 IMPLANT
SUT CHROMIC 3 0 SH 27 (SUTURE) IMPLANT
SUT PROLENE 2 0 SH DA (SUTURE) IMPLANT
SUT PROLENE 3 0 SH DA (SUTURE) IMPLANT
SWABSTK COMLB BENZOIN TINCTURE (MISCELLANEOUS) IMPLANT
SYR 10ML LL (SYRINGE) ×2 IMPLANT
TAPE CLOTH 3X10 WHT NS LF (GAUZE/BANDAGES/DRESSINGS) IMPLANT

## 2019-08-05 NOTE — Transfer of Care (Signed)
Immediate Anesthesia Transfer of Care Note  Patient: Curtis Gregory  Procedure(s) Performed: BOTOX INJECTION (N/A ) SPHINCTEROTOMY, Chemical anal (N/A )  Patient Location: PACU  Anesthesia Type:General  Level of Consciousness: awake and drowsy  Airway & Oxygen Therapy: Patient Spontanous Breathing and Patient connected to face mask oxygen  Post-op Assessment: Report given to RN and Post -op Vital signs reviewed and stable  Post vital signs: Reviewed and stable  Last Vitals:  Vitals Value Taken Time  BP 144/87 08/05/19 0959  Temp    Pulse 65 08/05/19 1002  Resp 15 08/05/19 1002  SpO2 100 % 08/05/19 1002  Vitals shown include unvalidated device data.  Last Pain:  Vitals:   08/05/19 0823  TempSrc: Temporal  PainSc: 3          Complications: No complications documented.

## 2019-08-05 NOTE — Anesthesia Preprocedure Evaluation (Signed)
Anesthesia Evaluation  Patient identified by MRN, date of birth, ID band Patient awake    Reviewed: Allergy & Precautions, NPO status , Patient's Chart, lab work & pertinent test results  History of Anesthesia Complications Negative for: history of anesthetic complications  Airway Mallampati: III       Dental   Pulmonary asthma , sleep apnea and Continuous Positive Airway Pressure Ventilation , neg COPD, Not current smoker,           Cardiovascular hypertension, Pt. on medications (-) Past MI and (-) CHF (-) dysrhythmias (-) Valvular Problems/Murmurs     Neuro/Psych Seizures - (none since a child),     GI/Hepatic Neg liver ROS, GERD  Medicated and Controlled,  Endo/Other  neg diabetes  Renal/GU negative Renal ROS     Musculoskeletal   Abdominal   Peds  Hematology   Anesthesia Other Findings   Reproductive/Obstetrics                             Anesthesia Physical Anesthesia Plan  ASA: III  Anesthesia Plan: General   Post-op Pain Management:    Induction: Intravenous  PONV Risk Score and Plan: 2 and Ondansetron and Dexamethasone  Airway Management Planned: LMA  Additional Equipment:   Intra-op Plan:   Post-operative Plan:   Informed Consent: I have reviewed the patients History and Physical, chart, labs and discussed the procedure including the risks, benefits and alternatives for the proposed anesthesia with the patient or authorized representative who has indicated his/her understanding and acceptance.       Plan Discussed with:   Anesthesia Plan Comments:         Anesthesia Quick Evaluation

## 2019-08-05 NOTE — Op Note (Signed)
Nonhealing anao-derm, anal fissures, status post hemorrhoidectomy with anal spasm.  Pre-operative Diagnosis: Anal spasm, nonhealing postoperative hemorrhoidectomy.  Post-operative Diagnosis: same.   Surgeon: Ronny Bacon, M.D., FACS  Anesthesia: General LMA  Findings: The known area on the patient's left remains well granulated, but does not have epidermal coverage.  Small linear tears created during examination due to small sphincteric canal.  Estimated Blood Loss: 10 mL         Specimens: None          Complications: none              Procedure Details  The patient was seen again in the Holding Room. The benefits, complications, treatment options, and expected outcomes were discussed with the patient. The risks of bleeding, infection, recurrence of symptoms, failure to resolve symptoms, unanticipated injury, prosthetic placement, prosthetic infection, any of which could require further surgery were reviewed with the patient. The likelihood of improving the patient's symptoms with return to their baseline status is hopeful.  The patient and/or family concurred with the proposed plan, giving informed consent.  The patient was taken to Operating Room, identified and the procedure verified.    Prior to the induction of general anesthesia, antibiotic prophylaxis was administered. VTE prophylaxis was in place.  General LMA was then administered and tolerated well. After the induction, the patient was positioned in the lithotomy position and the perianal region was prepped with Betadine and draped in the sterile fashion.  A Time Out was held and the above information confirmed. Digital exam was completed, confirming the above.  Palpated the internal sphincteric ring circumferentially.  I then dilated the anus to 28 mm.  Then proceeded with injection of a total of 100 units of Botox in approximately 5 aliquots circumferentially into the internal sphincteric musculature. I then apply dibucaine  to the Gelfoam sponge, wrapped it and placed it into the distal rectum/anus.  Fluffs ABD and supportive briefs applied.  Patient appeared to tolerate his procedure well.   Ronny Bacon M.D., [facility] 08/05/2019 10:08 AM

## 2019-08-05 NOTE — Anesthesia Postprocedure Evaluation (Signed)
Anesthesia Post Note  Patient: Curtis Gregory  Procedure(s) Performed: BOTOX INJECTION (N/A ) SPHINCTEROTOMY, Chemical anal (N/A )  Patient location during evaluation: PACU Anesthesia Type: General Level of consciousness: awake and alert Pain management: pain level controlled Vital Signs Assessment: post-procedure vital signs reviewed and stable Respiratory status: spontaneous breathing and respiratory function stable Cardiovascular status: stable Anesthetic complications: no   No complications documented.   Last Vitals:  Vitals:   08/05/19 1054 08/05/19 1121  BP:  (!) 142/74  Pulse: 51 56  Resp: 13 15  Temp:  (!) 36.1 C  SpO2: 100% 100%    Last Pain:  Vitals:   08/05/19 1121  TempSrc: Temporal  PainSc: 5                  Derreck Wiltsey K

## 2019-08-05 NOTE — Interval H&P Note (Signed)
History and Physical Interval Note:  08/05/2019 8:20 AM  Curtis Gregory  has presented today for surgery, with the diagnosis of Anal fissure.  The various methods of treatment have been discussed with the patient and family. After consideration of risks, benefits and other options for treatment, the patient has consented to  Procedure(s): BOTOX INJECTION (N/A) SPHINCTEROTOMY, Chemical anal (N/A) as a surgical intervention.  The patient's history has been reviewed, patient examined, no change in status, stable for surgery.  I have reviewed the patient's chart and labs.  Questions were answered to the patient's satisfaction.     Ronny Bacon

## 2019-08-05 NOTE — Anesthesia Procedure Notes (Signed)
Procedure Name: LMA Insertion Date/Time: 08/05/2019 9:18 AM Performed by: Debe Coder, CRNA Pre-anesthesia Checklist: Patient identified, Emergency Drugs available, Suction available and Patient being monitored Patient Re-evaluated:Patient Re-evaluated prior to induction Oxygen Delivery Method: Circle system utilized Preoxygenation: Pre-oxygenation with 100% oxygen Induction Type: IV induction Ventilation: Mask ventilation without difficulty LMA: LMA inserted LMA Size: 4.0 Number of attempts: 1 Placement Confirmation: positive ETCO2 and breath sounds checked- equal and bilateral Tube secured with: Tape Dental Injury: Teeth and Oropharynx as per pre-operative assessment

## 2019-08-06 ENCOUNTER — Encounter: Payer: Self-pay | Admitting: Surgery

## 2019-08-14 ENCOUNTER — Other Ambulatory Visit: Payer: Self-pay | Admitting: Family Medicine

## 2019-08-14 DIAGNOSIS — E7849 Other hyperlipidemia: Secondary | ICD-10-CM

## 2019-08-14 NOTE — Telephone Encounter (Signed)
Requested medication (s) are due for refill today: yes  Requested medication (s) are on the active medication list: yes  Last refill:  02/14/19  Future visit scheduled: no  Notes to clinic:  overdue labs   Requested Prescriptions  Pending Prescriptions Disp Refills   rosuvastatin (CRESTOR) 20 MG tablet [Pharmacy Med Name: ROSUVASTATIN CALCIUM 20 MG TAB] 90 tablet 1    Sig: TAKE 1 TABLET BY MOUTH EVERYDAY AT BEDTIME      Cardiovascular:  Antilipid - Statins Failed - 08/14/2019  9:07 AM      Failed - Total Cholesterol in normal range and within 360 days    Cholesterol  Date Value Ref Range Status  09/08/2017 157 <200 mg/dL Final          Failed - LDL in normal range and within 360 days    LDL Cholesterol (Calc)  Date Value Ref Range Status  09/08/2017 92 mg/dL (calc) Final    Comment:    Reference range: <100 . Desirable range <100 mg/dL for primary prevention;   <70 mg/dL for patients with CHD or diabetic patients  with > or = 2 CHD risk factors. [name] Kitchen LDL-C is now calculated using the Martin-Hopkins  calculation, which is a validated novel method providing  better accuracy than the Friedewald equation in the  estimation of LDL-C.  Cresenciano Genre et al. Annamaria Helling. 4536;468(03): 2061-2068  (http://education.[employer].com/faq/FAQ164)           Failed - HDL in normal range and within 360 days    HDL  Date Value Ref Range Status  09/08/2017 51 >40 mg/dL Final          Failed - Triglycerides in normal range and within 360 days    Triglycerides  Date Value Ref Range Status  09/08/2017 53 <150 mg/dL Final          Passed - Patient is not pregnant      Passed - Valid encounter within last 12 months    Recent Outpatient Visits           5 months ago Thrombosed external hemorrhoid   [place], DO   7 months ago Chronic pain of right knee   [place], DO   11 months ago Chronic pain of  right knee   [place], DO   1 year ago Acute right-sided low back pain without sciatica   [facility] [place] Hauser, DO   1 year ago Essential hypertension   [place], DO       Future Appointments             In 7 months [place], [place], [place]

## 2019-08-18 ENCOUNTER — Other Ambulatory Visit: Payer: Self-pay | Admitting: Urology

## 2019-08-18 DIAGNOSIS — N5201 Erectile dysfunction due to arterial insufficiency: Secondary | ICD-10-CM

## 2019-08-18 DIAGNOSIS — N401 Enlarged prostate with lower urinary tract symptoms: Secondary | ICD-10-CM

## 2019-08-18 DIAGNOSIS — R350 Frequency of micturition: Secondary | ICD-10-CM

## 2019-08-23 ENCOUNTER — Ambulatory Visit (INDEPENDENT_AMBULATORY_CARE_PROVIDER_SITE_OTHER): Payer: Self-pay | Admitting: Surgery

## 2019-08-23 ENCOUNTER — Other Ambulatory Visit: Payer: Self-pay

## 2019-08-23 ENCOUNTER — Encounter: Payer: Medicare Other | Admitting: Surgery

## 2019-08-23 ENCOUNTER — Encounter: Payer: Self-pay | Admitting: Surgery

## 2019-08-23 VITALS — BP 146/84 | HR 60 | Temp 98.3°F | Resp 12 | Ht 68.5 in | Wt 224.0 lb

## 2019-08-23 DIAGNOSIS — K602 Anal fissure, unspecified: Secondary | ICD-10-CM

## 2019-08-23 NOTE — Patient Instructions (Signed)
Continue to do miralax and metamucil.   Follow up as needed. Call the office if you have any questions or concerns.

## 2019-08-23 NOTE — Progress Notes (Signed)
[facility] POST-OP OFFICE VISIT  08/23/2019  HPI: Curtis Gregory is a 69 y.o. male 18 days s/p Botox sphincterotomy.  He reports having pain with a hard movement, is getting better at consistently utilizing Metamucil and MiraLAX to help prevent hard movements from occurring.  He denies any bleeding.  Reports his bowel movements have become at least daily.  He denies utilization of sitz bath's, denies fevers and chills.  He had a slight amount of stool seepage, but this was after utilization of additional laxatives that made it liquid.  Otherwise he reports continence of flatus and stool.  When he did have pain after the hard bowel movement it lasted for multiple hours, but seems to have resolved with utilization of acetaminophen/or Aleve.  Vital signs: BP (!) 146/84   Pulse 60   Temp 98.3 F (36.8 C)   Resp 12   Ht 5' 8.5" (1.74 m)   Wt 224 lb (101.6 kg)   SpO2 98%   BMI 33.56 kg/m    Physical Exam: Constitutional: Appears well. Abdomen: Benign, soft nontender. Skin: External anal skin is all well-healed.  I do not appreciate any palpable fissure.  He does not tolerate digital exam well, having voluntary spasm.  Assessment/Plan: This is a 69 y.o. male 18 days s/p Botox   Patient Active Problem List   Diagnosis Date Noted  . Anal fissure 07/26/2019  . Rectal or anal pain 06/23/2019  . Status post hemorrhoidectomy 05/17/2019  . Chronic pain of right knee 09/08/2018  . Elevated hemoglobin A1c 09/09/2017  . Vitamin D deficiency 09/09/2017  . Benign neoplasm of descending colon   . Seasonal allergies 04/30/2015  . Snoring 10/18/2014  . Torticollis, acute 10/18/2014  . Asthma, mild intermittent 06/16/2014  . Hyperlipidemia 06/16/2014  . Essential hypertension 06/16/2014  . GERD (gastroesophageal reflux disease) 06/16/2014  . Pain in the wrist 06/16/2014  . Apnea, sleep 06/16/2014  . Benign prostatic hyperplasia with urinary frequency 06/16/2014    -Follow-up  as needed.   Ronny Bacon M.D., FACS 08/23/2019, 9:52 AM

## 2019-09-06 ENCOUNTER — Encounter: Payer: Self-pay | Admitting: Family Medicine

## 2019-09-06 ENCOUNTER — Ambulatory Visit (INDEPENDENT_AMBULATORY_CARE_PROVIDER_SITE_OTHER): Payer: Medicare Other | Admitting: Family Medicine

## 2019-09-06 ENCOUNTER — Other Ambulatory Visit: Payer: Self-pay

## 2019-09-06 VITALS — BP 106/62 | HR 72 | Temp 97.6°F | Resp 16 | Ht 68.5 in | Wt 225.0 lb

## 2019-09-06 DIAGNOSIS — K602 Anal fissure, unspecified: Secondary | ICD-10-CM | POA: Diagnosis not present

## 2019-09-06 DIAGNOSIS — Z9989 Dependence on other enabling machines and devices: Secondary | ICD-10-CM | POA: Diagnosis not present

## 2019-09-06 DIAGNOSIS — G4733 Obstructive sleep apnea (adult) (pediatric): Secondary | ICD-10-CM

## 2019-09-06 NOTE — Progress Notes (Signed)
Subjective:    Patient ID: Curtis Gregory, male    DOB: 19-Sep-1950, 69 y.o.   MRN: [id number]  Curtis Gregory is a 69 y.o. male presenting on 09/06/2019 for Hemorrhoids (patient had procedure done 5 months ago to remove it and couple of other visits --as per patient is not healing and surgeon had Rx ointment but not helping to heal)   HPI   Hemorrhoids / Anal Fissure / Rectal Pain Followed by [facility], Dr Ronny Bacon, had hemorrhoidectomy 03/2019, later had followed up with anal fissure procedure with botox injection for sphincterotomy on 07/2019 - He has followed up with gen surgery and it is healing, last seen 08/23/19 He uses topical Nifedipine 0.3%-Lidocaine 1.3% - Petroleum ointment, compounded from [employer] - Using Miralax, metamucil with soft BMs - He is no longer on meloxicam. He takes Ibuprofen PRN and Tylenol. - He reports still episodes of some bleeding, asks about which medicines can increase risk of bleeding  OSA on CPAP He said cannot replace it except every 5 years Last seen by Dr Kathyrn Sheriff in 2019 for OSA He will follow up with them due to machine leak.   Depression screen [facility] 2/9 03/15/2019 01/04/2019 01/03/2019  Decreased Interest 0 0 0  Down, Depressed, Hopeless 0 0 0  PHQ - 2 Score 0 0 0    Social History   Tobacco Use  . Smoking status: Never Smoker  . Smokeless tobacco: Never Used  Vaping Use  . Vaping Use: Never used  Substance Use Topics  . Alcohol use: Yes    Alcohol/week: 1.0 standard drink    Types: 1 Glasses of wine per week    Comment: occasional  . Drug use: No    Review of Systems Per HPI unless specifically indicated above     Objective:    BP 106/62 (BP Location: Right Arm, Patient Position: Sitting, Cuff Size: Normal)   Pulse 72   Temp 97.6 F (36.4 C) (Temporal)   Resp 16   Ht 5' 8.5" (1.74 m)   Wt 225 lb (102.1 kg)   SpO2 98%   BMI 33.71 kg/m   Wt Readings from Last 3 Encounters:  09/06/19 225 lb (102.1 kg)    08/23/19 224 lb (101.6 kg)  08/05/19 (!) 222 lb 15.9 oz (101.1 kg)    Physical Exam Vitals and nursing note reviewed.  Constitutional:      General: He is not in acute distress.    Appearance: He is well-developed. He is not diaphoretic.     Comments: Well-appearing, comfortable, cooperative  HENT:     Head: Normocephalic and atraumatic.  Eyes:     General:        Right eye: No discharge.        Left eye: No discharge.     Conjunctiva/sclera: Conjunctivae normal.  Cardiovascular:     Rate and Rhythm: Normal rate.  Pulmonary:     Effort: Pulmonary effort is normal.  Genitourinary:    Comments: Declines rectal exam Skin:    General: Skin is warm and dry.     Findings: No erythema or rash.  Neurological:     Mental Status: He is alert and oriented to person, place, and time.  Psychiatric:        Behavior: Behavior normal.     Comments: Well groomed, good eye contact, normal speech and thoughts        Results for orders placed or performed during the hospital encounter of 08/03/19  SARS CORONAVIRUS 2 (TAT 6-24 HRS) Nasopharyngeal Nasopharyngeal Swab   Specimen: Nasopharyngeal Swab  Result Value Ref Range   SARS Coronavirus 2 NEGATIVE NEGATIVE      Assessment & Plan:   Problem List Items Addressed This Visit    Anal fissure - Primary    Other Visit Diagnoses    OSA on CPAP          Followed by [facility] Dr Christian Mate Prior surgery hemorrhoidectomy 03/2019 and botox sphincterotomy 07/2019 for anal fissure Last seen 08/23/19  Today we discussed his questions about inc risk of bleeding from meds, and advised to STOP or LIMIT ibuprofen NSAID orally, instead focus more on Tylenol up to 1000mg  TID regular dosing then PRN if need - he should keep up with same bowel regimen metamucil and miralax as advised by gen surgery - He can continue topical therapy compounded as well nifedipine  Reassurance today, keep on current post op plan per surgery  #OSA He  should return to ENT Dr Kathyrn Sheriff who had previously managed his OSA, already on CPAP, now has leak issue with machine. Call us back if need referral.  No orders of the defined types were placed in this encounter.     Follow up plan: Return in about 6 months (around 03/08/2020) for Follow-up 6 months Yearly Medicare Checkup (fasting lab after visit).  Future lab orders to be drawn fasting after his Yearly Medicare Checkup in 6 months  [name], [facility] 09/06/2019, 2:32 PM

## 2019-09-06 NOTE — Patient Instructions (Addendum)
Thank you for coming to the office today.  If concerns of bleeding - I would PAUSE the Ibuprofen for now, this can make bleeding worse.  Recommend to start taking Tylenol Extra Strength 500mg  tabs - take 1 to 2 tabs per dose (max 1000mg ) every 6-8 hours for pain (take regularly, don't skip a dose for next 7 days), max 24 hour daily dose is 6 tablets or 3000mg . In the future you can repeat the same everyday Tylenol course for 1-2 weeks at a time.  If you prefer can taper down on Tylenol and use reduced dose after 2 weeks, but still use it every day.  May use ibuprofen ONLY in an emergency setting if acute pain, but this can provoke bleeding. Try to be careful with it, but can still use if truly needed.  Keep on bowel regimen. I do not think that they are causing any bleeding.  ------- Try to call Dr Maisie Fus office about CPAP machine.  [facility] [address]  [place], [place] [address] Ph: [phone]  If they need a new order from [place] for referral - we can place that, just let me know. Message or call.   AFTER VISIT WE WILL GET BLOOD DRAWN - Next time DUE for FASTING BLOOD WORK (no food or drink after midnight before the lab appointment, only water or coffee without cream/sugar on the morning of)  For Lab Results, once available within 2-3 days of blood draw, you can can log in to MyChart online to view your results and a brief explanation. Also, we can discuss results at next follow-up visit.     Please schedule a Follow-up Appointment to: Return in about 6 months (around 03/08/2020) for Follow-up 6 months Yearly Medicare Checkup (fasting lab after visit).  If you have any other questions or concerns, please feel free to call the office or send a message through [place]. You may also schedule an earlier appointment if necessary.  Additionally, you may be receiving a survey about your experience at our office within a few days to 1 week by e-mail or mail. We  value your feedback.  [name], DO [place]

## 2019-09-20 ENCOUNTER — Encounter: Payer: Self-pay | Admitting: Emergency Medicine

## 2019-09-20 ENCOUNTER — Other Ambulatory Visit: Payer: Self-pay

## 2019-09-20 ENCOUNTER — Emergency Department
Admission: EM | Admit: 2019-09-20 | Discharge: 2019-09-20 | Disposition: A | Discharge status: Home / Self Care | Payer: Medicare Other | Attending: Emergency Medicine | Admitting: Emergency Medicine

## 2019-09-20 DIAGNOSIS — M542 Cervicalgia: Secondary | ICD-10-CM | POA: Diagnosis not present

## 2019-09-20 DIAGNOSIS — Y9389 Activity, other specified: Secondary | ICD-10-CM | POA: Diagnosis not present

## 2019-09-20 DIAGNOSIS — Y92410 Unspecified street and highway as the place of occurrence of the external cause: Secondary | ICD-10-CM | POA: Insufficient documentation

## 2019-09-20 DIAGNOSIS — Z5321 Procedure and treatment not carried out due to patient leaving prior to being seen by health care provider: Secondary | ICD-10-CM | POA: Diagnosis not present

## 2019-09-20 DIAGNOSIS — Y999 Unspecified external cause status: Secondary | ICD-10-CM | POA: Insufficient documentation

## 2019-09-20 DIAGNOSIS — M545 Low back pain: Secondary | ICD-10-CM | POA: Insufficient documentation
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
## 2019-09-22 DIAGNOSIS — H40003 Preglaucoma, unspecified, bilateral: Secondary | ICD-10-CM | POA: Diagnosis not present

## 2019-09-28 ENCOUNTER — Other Ambulatory Visit: Payer: Self-pay | Admitting: Family Medicine

## 2019-09-28 DIAGNOSIS — E7849 Other hyperlipidemia: Secondary | ICD-10-CM

## 2019-09-28 NOTE — Telephone Encounter (Signed)
Requested medication (s) are due for refill today: yes  Requested medication (s) are on the active medication list: yes  Last refill:  02/14/19  #90  1 refill  Future visit scheduled: No  Notes to clinic: labs are from 2019 failed protocol    Requested Prescriptions  Pending Prescriptions Disp Refills   rosuvastatin (CRESTOR) 20 MG tablet [Pharmacy Med Name: ROSUVASTATIN CALCIUM 20 MG TAB] 90 tablet 1    Sig: TAKE 1 TABLET BY MOUTH EVERYDAY AT BEDTIME      Cardiovascular:  Antilipid - Statins Failed - 09/28/2019  7:34 PM      Failed - Total Cholesterol in normal range and within 360 days    Cholesterol  Date Value Ref Range Status  09/08/2017 157 <200 mg/dL Final          Failed - LDL in normal range and within 360 days    LDL Cholesterol (Calc)  Date Value Ref Range Status  09/08/2017 92 mg/dL (calc) Final    Comment:    Reference range: <100 . Desirable range <100 mg/dL for primary prevention;   <70 mg/dL for patients with CHD or diabetic patients  with > or = 2 CHD risk factors. [name] Kitchen LDL-C is now calculated using the Martin-Hopkins  calculation, which is a validated novel method providing  better accuracy than the Friedewald equation in the  estimation of LDL-C.  Cresenciano Genre et al. Annamaria Helling. 0383;338(32): 2061-2068  (http://education.[employer].com/faq/FAQ164)           Failed - HDL in normal range and within 360 days    HDL  Date Value Ref Range Status  09/08/2017 51 >40 mg/dL Final          Failed - Triglycerides in normal range and within 360 days    Triglycerides  Date Value Ref Range Status  09/08/2017 53 <150 mg/dL Final          Passed - Patient is not pregnant      Passed - Valid encounter within last 12 months    Recent Outpatient Visits           3 weeks ago Anal fissure   [facility] [place], Devonne Doughty, DO   6 months ago Thrombosed external hemorrhoid   [place], DO   8  months ago Chronic pain of right knee   [place], DO   1 year ago Chronic pain of right knee   [place], DO   1 year ago Acute right-sided low back pain without sciatica   [place], DO       Future Appointments             In 5 months [place], Ronda Fairly, MD [place]

## 2019-10-04 DIAGNOSIS — Z20822 Contact with and (suspected) exposure to covid-19: Secondary | ICD-10-CM | POA: Diagnosis not present

## 2019-10-07 ENCOUNTER — Other Ambulatory Visit: Payer: Self-pay

## 2019-10-07 ENCOUNTER — Ambulatory Visit (INDEPENDENT_AMBULATORY_CARE_PROVIDER_SITE_OTHER): Payer: Medicare Other

## 2019-10-07 DIAGNOSIS — Z23 Encounter for immunization: Secondary | ICD-10-CM

## 2019-10-24 ENCOUNTER — Ambulatory Visit: Payer: Medicare Other | Attending: Internal Medicine

## 2019-10-24 DIAGNOSIS — Z23 Encounter for immunization: Secondary | ICD-10-CM

## 2019-10-24 NOTE — Progress Notes (Signed)
   Covid-19 Vaccination Clinic  Name:  Sheddrick Lattanzio    MRN: [id number] DOB: Oct 01, 1950  10/24/2019  Mr. Desa was observed post Covid-19 immunization for 15 minutes without incident. He was provided with Vaccine Information Sheet and instruction to access the V-Safe system.   Mr. Acy was instructed to call 911 with any severe reactions post vaccine: [name] Kitchen Difficulty breathing  . Swelling of face and throat  . A fast heartbeat  . A bad rash all over body  . Dizziness and weakness

## 2019-11-08 ENCOUNTER — Encounter: Payer: Self-pay | Admitting: Family Medicine

## 2019-11-08 DIAGNOSIS — H2513 Age-related nuclear cataract, bilateral: Secondary | ICD-10-CM | POA: Diagnosis not present

## 2019-11-08 DIAGNOSIS — H401233 Low-tension glaucoma, bilateral, severe stage: Secondary | ICD-10-CM | POA: Diagnosis not present

## 2019-11-08 DIAGNOSIS — H3561 Retinal hemorrhage, right eye: Secondary | ICD-10-CM | POA: Diagnosis not present

## 2019-11-12 ENCOUNTER — Other Ambulatory Visit: Payer: Self-pay | Admitting: Family Medicine

## 2019-11-12 DIAGNOSIS — I10 Essential (primary) hypertension: Secondary | ICD-10-CM

## 2019-11-12 NOTE — Telephone Encounter (Signed)
Requested Prescriptions  Pending Prescriptions Disp Refills   hydrochlorothiazide (HYDRODIURIL) 12.5 MG tablet [Pharmacy Med Name: HYDROCHLOROTHIAZIDE 12.5 MG TB] 90 tablet 1    Sig: TAKE 1 TABLET BY MOUTH EVERY DAY     Cardiovascular: Diuretics - Thiazide Failed - 11/12/2019  8:52 AM      Failed - Ca in normal range and within 360 days    Calcium  Date Value Ref Range Status  07/28/2019 8.7 (L) 8.9 - 10.3 mg/dL Final         Failed - Last BP in normal range    BP Readings from Last 1 Encounters:  09/20/19 126/90         Passed - Cr in normal range and within 360 days    Creat  Date Value Ref Range Status  09/08/2017 1.08 0.70 - 1.25 mg/dL Final    Comment:    For patients >24 years of age, the reference limit for Creatinine is approximately 13% higher for people identified as African-American. .    Creatinine, Ser  Date Value Ref Range Status  07/28/2019 1.16 0.61 - 1.24 mg/dL Final         Passed - K in normal range and within 360 days    Potassium  Date Value Ref Range Status  07/28/2019 3.6 3.5 - 5.1 mmol/L Final         Passed - Na in normal range and within 360 days    Sodium  Date Value Ref Range Status  07/28/2019 137 135 - 145 mmol/L Final         Passed - Valid encounter within last 6 months    Recent Outpatient Visits          2 months ago Anal fissure   [facility] [place], Devonne Doughty, DO   8 months ago Thrombosed external hemorrhoid   [place], DO   10 months ago Chronic pain of right knee   [place], DO   1 year ago Chronic pain of right knee   [place], DO   1 year ago Acute right-sided low back pain without sciatica   [place], DO      Future Appointments            In 4 months [place], Ronda Fairly, [facility]

## 2019-12-15 ENCOUNTER — Other Ambulatory Visit: Payer: Self-pay | Admitting: Urology

## 2019-12-15 DIAGNOSIS — N401 Enlarged prostate with lower urinary tract symptoms: Secondary | ICD-10-CM

## 2019-12-15 DIAGNOSIS — N5201 Erectile dysfunction due to arterial insufficiency: Secondary | ICD-10-CM

## 2020-01-24 ENCOUNTER — Ambulatory Visit (INDEPENDENT_AMBULATORY_CARE_PROVIDER_SITE_OTHER): Payer: Medicare Other

## 2020-01-24 ENCOUNTER — Telehealth: Payer: Self-pay

## 2020-01-24 VITALS — Ht 68.0 in | Wt 220.0 lb

## 2020-01-24 DIAGNOSIS — Z0000 Encounter for general adult medical examination without abnormal findings: Secondary | ICD-10-CM | POA: Diagnosis not present

## 2020-01-24 NOTE — Progress Notes (Signed)
I connected with Curtis Gregory today by telephone and verified that I am speaking with the correct person using two identifiers. Location patient: home Location provider: work Persons participating in the virtual visit: Levis Ducre, Glenna Durand LPN.   I discussed the limitations, risks, security and privacy concerns of performing an evaluation and management service by telephone and the availability of in person appointments. I also discussed with the patient that there may be a patient responsible charge related to this service. The patient expressed understanding and verbally consented to this telephonic visit.    Interactive audio and video telecommunications were attempted between this provider and patient, however failed, due to patient having technical difficulties OR patient did not have access to video capability.  We continued and completed visit with audio only.     Vital signs may be patient reported or missing.  Subjective:   Curtis Gregory is a 70 y.o. male who presents for Medicare Annual/Subsequent preventive examination.  Review of Systems     Cardiac Risk Factors include: advanced age (>42men, >28 women);dyslipidemia;hypertension;obesity (BMI >30kg/m2);sedentary lifestyle     Objective:    Today's Vitals   01/24/20 1316  Weight: 220 lb (99.8 kg)  Height: 5\' 8"  (1.727 m)   Body mass index is 33.45 kg/m.  Advanced Directives 01/24/2020 09/20/2019 08/05/2019 03/30/2019 03/16/2019 01/04/2019 09/15/2017  Does Patient Have a Medical Advance Directive? Yes No No Yes Yes Yes Yes  Type of Paramedic of [place];Living will - - Living will;Healthcare Power of [place];Living will Living will;Healthcare Power of Attorney Living will;Healthcare Power of Attorney  Does patient want to make changes to medical advance directive? - - - No - Patient declined No - Patient declined - -  Copy of [place] in Chart? No  - copy requested - - No - copy requested No - copy requested No - copy requested No - copy requested  Would patient like information on creating a medical advance directive? - - No - Patient declined - - - -    Current Medications (verified) Outpatient Encounter Medications as of 01/24/2020  Medication Sig  . albuterol (PROVENTIL HFA;VENTOLIN HFA) 108 (90 BASE) MCG/ACT inhaler Inhale 1-2 puffs into the lungs every 6 (six) hours as needed for wheezing or shortness of breath. Reported on 06/04/2015  . fexofenadine (ALLEGRA) 180 MG tablet TAKE 1 TABLET BY MOUTH EVERY DAY (Patient taking differently: Take 180 mg by mouth every morning.)  . finasteride (PROSCAR) 5 MG tablet Take 1 tablet (5 mg total) by mouth daily. (Patient taking differently: Take 5 mg by mouth every morning.)  . fluticasone (FLONASE) 50 MCG/ACT nasal spray Place 2 sprays into both nostrils daily. Use for 4-6 weeks then stop and use seasonally or as needed. (Patient taking differently: Place 2 sprays into both nostrils daily as needed for allergies. Use for 4-6 weeks then stop and use seasonally or as needed.)  . hydrochlorothiazide (HYDRODIURIL) 12.5 MG tablet TAKE 1 TABLET BY MOUTH EVERY DAY  . hydrocortisone (ANUSOL-HC) 25 MG suppository Place 1 suppository (25 mg total) rectally 2 (two) times daily. For 7 days  . ibuprofen (ADVIL) 200 MG tablet Take 400 mg by mouth every 8 (eight) hours as needed (pain.).  [name] Kitchen ipratropium (ATROVENT) 0.06 % nasal spray USE 2 SPRAYS IN EACH NOSTRIL FOUR TIMES DAILY FOR UP TO 5 TO 7 DAYS THEN STOP (Patient taking differently: Place 2 sprays into both nostrils daily as needed (allergies.).)  . latanoprost (XALATAN)  0.005 % ophthalmic solution Place 1 drop into both eyes at bedtime.   . NONFORMULARY OR COMPOUNDED ITEM Place 1 application rectally 3 (three) times daily as needed (hemmorroids.). Nifedipine 0.3-Lidocaine 1.3-Petroleum ointment  . omeprazole (PRILOSEC) 20 MG capsule TAKE 1 CAPSULE BY MOUTH  EVERY DAY (Patient taking differently: Take 20 mg by mouth daily before breakfast.)  . rosuvastatin (CRESTOR) 20 MG tablet Take 1 tablet (20 mg total) by mouth at bedtime.  . sildenafil (REVATIO) 20 MG tablet TAKE TWO TO FIVE TABLETS BY MOUTH ONE HOUR PRIOR TO INTERCOURSE  . tamsulosin (FLOMAX) 0.4 MG CAPS capsule Take 1 capsule (0.4 mg total) by mouth daily after breakfast.  . timolol (TIMOPTIC-XR) 0.5 % ophthalmic gel-forming Place 1 drop into the left eye at bedtime.    No facility-administered encounter medications on file as of 01/24/2020.    Allergies (verified) Patient has no known allergies.   History: Past Medical History:  Diagnosis Date  . Asthma   . Benign prostatic hypertrophy with urinary frequency 06/16/2014  . Excessive urination at night 06/16/2014  . GERD (gastroesophageal reflux disease)   . Glaucoma   . Hyperlipemia   . Hypertension   . Internal and external thrombosed hemorrhoids 03/15/2019  . Sleep apnea    Past Surgical History:  Procedure Laterality Date  . BOTOX INJECTION N/A 08/05/2019   Procedure: BOTOX INJECTION;  Surgeon: Ronny Bacon, MD;  Location: [facility];  Service: General;  Laterality: N/A;  . COLONOSCOPY WITH PROPOFOL N/A 09/11/2016   Procedure: COLONOSCOPY WITH PROPOFOL;  Surgeon: Lucilla Lame, MD;  Location: [place];  Service: Gastroenterology;  Laterality: N/A;  sleep apnea  . HEMORRHOID SURGERY N/A 03/30/2019   Procedure: HEMORRHOIDECTOMY;  Surgeon: Ronny Bacon, MD;  Location: [facility];  Service: General;  Laterality: N/A;  . none    . POLYPECTOMY N/A 09/11/2016   Procedure: POLYPECTOMY;  Surgeon: Lucilla Lame, MD;  Location: [place];  Service: Gastroenterology;  Laterality: N/A;  . SPHINCTEROTOMY N/A 08/05/2019   Procedure: SPHINCTEROTOMY, Chemical anal;  Surgeon: Ronny Bacon, MD;  Location: [facility];  Service: General;  Laterality: N/A;   Family History  Problem Relation Age of Onset  . Diabetes Mother   .  Heart disease Mother   . Alzheimer's disease Father   . Prostate cancer Father   . Prostate cancer Brother   . Kidney cancer Neg Hx    Social History   Socioeconomic History  . Marital status: Married    Spouse name: Not on file  . Number of children: Not on file  . Years of education: Not on file  . Highest education level: Master's degree (e.g., MA, MS, MEng, MEd, MSW, MBA)  Occupational History  . Occupation: retired  Tobacco Use  . Smoking status: Never Smoker  . Smokeless tobacco: Never Used  Vaping Use  . Vaping Use: Never used  Substance and Sexual Activity  . Alcohol use: Yes    Alcohol/week: 1.0 standard drink    Types: 1 Glasses of wine per week    Comment: occasional  . Drug use: No  . Sexual activity: Not Currently  Other Topics Concern  . Not on file  Social History Narrative  . Not on file   Social Determinants of Health   Financial Resource Strain: Low Risk   . Difficulty of Paying Living Expenses: Not hard at all  Food Insecurity: No Food Insecurity  . Worried About Charity fundraiser in the Last Year: Never true  .  Ran Out of Food in the Last Year: Never true  Transportation Needs: No Transportation Needs  . Lack of Transportation (Medical): No  . Lack of Transportation (Non-Medical): No  Physical Activity: Insufficiently Active  . Days of Exercise per Week: 3 days  . Minutes of Exercise per Session: 30 min  Stress: No Stress Concern Present  . Feeling of Stress : Not at all  Social Connections: Not on file    Tobacco Counseling Counseling given: Not Answered   Clinical Intake:  Pre-visit preparation completed: Yes  Pain : No/denies pain     Nutritional Status: BMI > 30  Obese Nutritional Risks: None Diabetes: No  How often do you need to have someone help you when you read instructions, pamphlets, or other written materials from your doctor or pharmacy?: 1 - Never What is the last grade level you completed in school?: master's  degree  Diabetic? no  Interpreter Needed?: No  Information entered by :: NAllen LPN   Activities of Daily Living In your present state of health, do you have any difficulty performing the following activities: 01/24/2020 07/27/2019  Hearing? N N  Vision? N N  Difficulty concentrating or making decisions? N N  Walking or climbing stairs? N N  Dressing or bathing? N N  Doing errands, shopping? N N  Preparing Food and eating ? N -  Using the Toilet? N -  In the past six months, have you accidently leaked urine? N -  Do you have problems with loss of bowel control? N -  Managing your Medications? N -  Managing your Finances? N -  Housekeeping or managing your Housekeeping? N -  Some recent data might be hidden    Patient Care Team: Olin Hauser, DO as PCP - General (Family Medicine) Nickie Retort, MD as Consulting Physician (Urology)  Indicate any recent Medical Services you may have received from other than [facility] providers in the past year (date may be approximate).     Assessment:   This is a routine wellness examination for Curtis Gregory.  Hearing/Vision screen No exam data present  Dietary issues and exercise activities discussed: Current Exercise Habits: Home exercise routine, Type of exercise: strength training/weights, Time (Minutes): 30, Frequency (Times/Week): 3, Weekly Exercise (Minutes/Week): 90  Goals    . DIET - INCREASE WATER INTAKE     Recommend drinking at least 6-8 glasses of water a day     . Patient Stated     01/24/2020, no goals      Depression Screen PHQ 2/9 Scores 01/24/2020 03/15/2019 01/04/2019 01/03/2019 11/20/2017 09/18/2017 09/15/2017  PHQ - 2 Score 0 0 0 0 0 0 0    Fall Risk Fall Risk  01/24/2020 08/23/2019 07/26/2019 06/23/2019 05/17/2019  Falls in the past year? 0 0 0 0 0  Comment - - - - -  Number falls in past yr: - 0 - - -  Injury with Fall? - 0 - - -  Risk for fall due to : Medication side effect - - - -  Follow up Falls  evaluation completed;Education provided;Falls prevention discussed - - - -    FALL RISK PREVENTION PERTAINING TO THE HOME:  Any stairs in or around the home? No  If so, are there any without handrails? n/a Home free of loose throw rugs in walkways, pet beds, electrical cords, etc? Yes  Adequate lighting in your home to reduce risk of falls? Yes   ASSISTIVE DEVICES UTILIZED TO PREVENT FALLS:  Life  alert? No  Use of a cane, walker or w/c? No  Grab bars in the bathroom? No  Shower chair or bench in shower? No  Elevated toilet seat or a handicapped toilet? No   TIMED UP AND GO:  Was the test performed? No .    Cognitive Function:     6CIT Screen 01/24/2020 09/15/2017  What Year? 0 points 0 points  What month? 0 points 0 points  What time? 0 points 0 points  Count back from 20 0 points 0 points  Months in reverse 0 points 0 points  Repeat phrase 2 points 2 points  Total Score 2 2    Immunizations Immunization History  Administered Date(s) Administered  . Fluad Quad(high Dose 65+) 09/08/2018, 10/07/2019  . Influenza, High Dose Seasonal PF 10/06/2016, 11/10/2017  . Influenza,inj,Quad PF,6+ Mos 10/21/2013, 10/18/2014, 11/08/2015  . PFIZER SARS-COV-2 Vaccination 02/20/2019, 03/17/2019, 10/24/2019  . Pneumococcal Conjugate-13 10/06/2016  . Pneumococcal Polysaccharide-23 09/08/2018  . Tdap 10/28/2014    TDAP status: Up to date  Flu Vaccine status: Up to date  Pneumococcal vaccine status: Up to date  Covid-19 vaccine status: Completed vaccines  Qualifies for Shingles Vaccine? Yes   Zostavax completed Yes   Shingrix Completed?: No.    Education has been provided regarding the importance of this vaccine. Patient has been advised to call insurance company to determine out of pocket expense if they have not yet received this vaccine. Advised may also receive vaccine at local pharmacy or Health Dept. Verbalized acceptance and understanding.  Screening Tests Health Maintenance   Topic Date Due  . COLONOSCOPY (Pts 45-37yrs Insurance coverage will need to be confirmed)  09/11/2021  . TETANUS/TDAP  10/27/2024  . INFLUENZA VACCINE  Completed  . COVID-19 Vaccine  Completed  . Hepatitis C Screening  Completed  . PNA vac Low Risk Adult  Completed    Health Maintenance  There are no preventive care reminders to display for this patient.  Colorectal cancer screening: Type of screening: Colonoscopy. Completed 09/11/2016. Repeat every 5 years  Lung Cancer Screening: (Low Dose CT Chest recommended if Age 75-80 years, 30 pack-year currently smoking OR have quit w/in 15years.) does not qualify.   Lung Cancer Screening Referral: no  Additional Screening:  Hepatitis C Screening: does qualify; Completed 05/22/2016  Vision Screening: Recommended annual ophthalmology exams for early detection of glaucoma and other disorders of the eye. Is the patient up to date with their annual eye exam?  No  Who is the provider or what is the name of the office in which the patient attends annual eye exams? [facility] If pt is not established with a provider, would they like to be referred to a provider to establish care? No .   Dental Screening: Recommended annual dental exams for proper oral hygiene  Community Resource Referral / Chronic Care Management: CRR required this visit?  No   CCM required this visit?  No      Plan:     I have personally reviewed and noted the following in the patient's chart:   . Medical and social history . Use of alcohol, tobacco or illicit drugs  . Current medications and supplements . Functional ability and status . Nutritional status . Physical activity . Advanced directives . List of other physicians . Hospitalizations, surgeries, and ER visits in previous 12 months . Vitals . Screenings to include cognitive, depression, and falls . Referrals and appointments  In addition, I have reviewed and discussed with patient  certain  preventive protocols, quality metrics, and best practice recommendations. A written personalized care plan for preventive services as well as general preventive health recommendations were provided to patient.     Kellie Simmering, LPN   [id number]   Nurse Notes:

## 2020-01-24 NOTE — Patient Instructions (Signed)
[name] , Thank you for taking time to come for your Medicare Wellness Visit. I appreciate your ongoing commitment to your health goals. Please review the following plan we discussed and let me know if I can assist you in the future.   Screening recommendations/referrals: Colonoscopy: completed 09/11/2016, due 09/11/2021 Recommended yearly ophthalmology/optometry visit for glaucoma screening and checkup Recommended yearly dental visit for hygiene and checkup  Vaccinations: Influenza vaccine: completed 10/07/2019, due 08/13/2020 Pneumococcal vaccine: completed 09/08/2018 Tdap vaccine: completed 10/28/2014, due 10/27/2024 Shingles vaccine: discussed   Covid-19:  10/24/2019, 03/17/2019, 02/20/2019  Advanced directives: Please bring a copy of your POA (Power of Attorney) and/or Living Will to your next appointment.   Conditions/risks identified: none  Next appointment: Follow up in one year for your annual wellness visit.   Preventive Care 69 Years and Older, Male Preventive care refers to lifestyle choices and visits with your health care provider that can promote health and wellness. What does preventive care include?  A yearly physical exam. This is also called an annual well check.  Dental exams once or twice a year.  Routine eye exams. Ask your health care provider how often you should have your eyes checked.  Personal lifestyle choices, including:  Daily care of your teeth and gums.  Regular physical activity.  Eating a healthy diet.  Avoiding tobacco and drug use.  Limiting alcohol use.  Practicing safe sex.  Taking low doses of aspirin every day.  Taking vitamin and mineral supplements as recommended by your health care provider. What happens during an annual well check? The services and screenings done by your health care provider during your annual well check will depend on your age, overall health, lifestyle risk factors, and family history of disease. Counseling   Your health care provider may ask you questions about your:  Alcohol use.  Tobacco use.  Drug use.  Emotional well-being.  Home and relationship well-being.  Sexual activity.  Eating habits.  History of falls.  Memory and ability to understand (cognition).  Work and work Statistician. Screening  You may have the following tests or measurements:  Height, weight, and BMI.  Blood pressure.  Lipid and cholesterol levels. These may be checked every 5 years, or more frequently if you are over 74 years old.  Skin check.  Lung cancer screening. You may have this screening every year starting at age 69 if you have a 30-pack-year history of smoking and currently smoke or have quit within the past 15 years.  Fecal occult blood test (FOBT) of the stool. You may have this test every year starting at age 69.  Flexible sigmoidoscopy or colonoscopy. You may have a sigmoidoscopy every 5 years or a colonoscopy every 10 years starting at age 69.  Prostate cancer screening. Recommendations will vary depending on your family history and other risks.  Hepatitis C blood test.  Hepatitis B blood test.  Sexually transmitted disease (STD) testing.  Diabetes screening. This is done by checking your blood sugar (glucose) after you have not eaten for a while (fasting). You may have this done every 1-3 years.  Abdominal aortic aneurysm (AAA) screening. You may need this if you are a current or former smoker.  Osteoporosis. You may be screened starting at age 52 if you are at high risk. Talk with your health care provider about your test results, treatment options, and if necessary, the need for more tests. Vaccines  Your health care provider may recommend certain vaccines, such as:  Influenza  vaccine. This is recommended every year.  Tetanus, diphtheria, and acellular pertussis (Tdap, Td) vaccine. You may need a Td booster every 10 years.  Zoster vaccine. You may need this after age  69.  Pneumococcal 13-valent conjugate (PCV13) vaccine. One dose is recommended after age 69.  Pneumococcal polysaccharide (PPSV23) vaccine. One dose is recommended after age 74. Talk to your health care provider about which screenings and vaccines you need and how often you need them. This information is not intended to replace advice given to you by your health care provider. Make sure you discuss any questions you have with your health care provider. Document Released: 01/26/2015 Document Revised: 09/19/2015 Document Reviewed: 10/31/2014 Elsevier Interactive Patient Education  2017 [place] Prevention in the Home Falls can cause injuries. They can happen to people of all ages. There are many things you can do to make your home safe and to help prevent falls. What can I do on the outside of my home?  Regularly fix the edges of walkways and driveways and fix any cracks.  Remove anything that might make you trip as you walk through a door, such as a raised step or threshold.  Trim any bushes or trees on the path to your home.  Use bright outdoor lighting.  Clear any walking paths of anything that might make someone trip, such as rocks or tools.  Regularly check to see if handrails are loose or broken. Make sure that both sides of any steps have handrails.  Any raised decks and porches should have guardrails on the edges.  Have any leaves, snow, or ice cleared regularly.  Use sand or salt on walking paths during winter.  Clean up any spills in your garage right away. This includes oil or grease spills. What can I do in the bathroom?  Use night lights.  Install grab bars by the toilet and in the tub and shower. Do not use towel bars as grab bars.  Use non-skid mats or decals in the tub or shower.  If you need to sit down in the shower, use a plastic, non-slip stool.  Keep the floor dry. Clean up any water that spills on the floor as soon as it happens.  Remove  soap buildup in the tub or shower regularly.  Attach bath mats securely with double-sided non-slip rug tape.  Do not have throw rugs and other things on the floor that can make you trip. What can I do in the bedroom?  Use night lights.  Make sure that you have a light by your bed that is easy to reach.  Do not use any sheets or blankets that are too big for your bed. They should not hang down onto the floor.  Have a firm chair that has side arms. You can use this for support while you get dressed.  Do not have throw rugs and other things on the floor that can make you trip. What can I do in the kitchen?  Clean up any spills right away.  Avoid walking on wet floors.  Keep items that you use a lot in easy-to-reach places.  If you need to reach something above you, use a strong step stool that has a grab bar.  Keep electrical cords out of the way.  Do not use floor polish or wax that makes floors slippery. If you must use wax, use non-skid floor wax.  Do not have throw rugs and other things on the floor that can  make you trip. What can I do with my stairs?  Do not leave any items on the stairs.  Make sure that there are handrails on both sides of the stairs and use them. Fix handrails that are broken or loose. Make sure that handrails are as long as the stairways.  Check any carpeting to make sure that it is firmly attached to the stairs. Fix any carpet that is loose or worn.  Avoid having throw rugs at the top or bottom of the stairs. If you do have throw rugs, attach them to the floor with carpet tape.  Make sure that you have a light switch at the top of the stairs and the bottom of the stairs. If you do not have them, ask someone to add them for you. What else can I do to help prevent falls?  Wear shoes that:  Do not have high heels.  Have rubber bottoms.  Are comfortable and fit you well.  Are closed at the toe. Do not wear sandals.  If you use a  stepladder:  Make sure that it is fully opened. Do not climb a closed stepladder.  Make sure that both sides of the stepladder are locked into place.  Ask someone to hold it for you, if possible.  Clearly mark and make sure that you can see:  Any grab bars or handrails.  First and last steps.  Where the edge of each step is.  Use tools that help you move around (mobility aids) if they are needed. These include:  Canes.  Walkers.  Scooters.  Crutches.  Turn on the lights when you go into a dark area. Replace any light bulbs as soon as they burn out.  Set up your furniture so you have a clear path. Avoid moving your furniture around.  If any of your floors are uneven, fix them.  If there are any pets around you, be aware of where they are.  Review your medicines with your doctor. Some medicines can make you feel dizzy. This can increase your chance of falling. Ask your doctor what other things that you can do to help prevent falls. This information is not intended to replace advice given to you by your health care provider. Make sure you discuss any questions you have with your health care provider. Document Released: 10/26/2008 Document Revised: 06/07/2015 Document Reviewed: 02/03/2014 Elsevier Interactive Patient Education  2017 [employer].

## 2020-01-24 NOTE — Telephone Encounter (Signed)
Patient consented to telehealth visit. 

## 2020-01-28 DIAGNOSIS — Z20822 Contact with and (suspected) exposure to covid-19: Secondary | ICD-10-CM | POA: Diagnosis not present

## 2020-01-28 DIAGNOSIS — Z03818 Encounter for observation for suspected exposure to other biological agents ruled out: Secondary | ICD-10-CM | POA: Diagnosis not present

## 2020-02-05 ENCOUNTER — Other Ambulatory Visit: Payer: Self-pay | Admitting: Family Medicine

## 2020-02-05 DIAGNOSIS — K219 Gastro-esophageal reflux disease without esophagitis: Secondary | ICD-10-CM

## 2020-02-28 ENCOUNTER — Other Ambulatory Visit: Payer: Self-pay | Admitting: Family Medicine

## 2020-02-28 DIAGNOSIS — N401 Enlarged prostate with lower urinary tract symptoms: Secondary | ICD-10-CM

## 2020-02-28 DIAGNOSIS — R350 Frequency of micturition: Secondary | ICD-10-CM

## 2020-02-28 DIAGNOSIS — N5201 Erectile dysfunction due to arterial insufficiency: Secondary | ICD-10-CM

## 2020-02-28 MED ORDER — SILDENAFIL CITRATE 20 MG PO TABS: ORAL_TABLET | ORAL | 1 refills | Status: DC

## 2020-03-05 ENCOUNTER — Other Ambulatory Visit: Payer: Medicare Other

## 2020-03-05 ENCOUNTER — Other Ambulatory Visit: Payer: Self-pay

## 2020-03-05 DIAGNOSIS — N401 Enlarged prostate with lower urinary tract symptoms: Secondary | ICD-10-CM

## 2020-03-06 LAB — PSA: Prostate Specific Ag, Serum: 0.4 ng/mL (ref 0.0–4.0)

## 2020-03-12 ENCOUNTER — Ambulatory Visit: Payer: Self-pay | Admitting: Urology

## 2020-03-16 ENCOUNTER — Encounter: Payer: Self-pay | Admitting: Urology

## 2020-03-16 ENCOUNTER — Other Ambulatory Visit: Payer: Self-pay

## 2020-03-16 ENCOUNTER — Ambulatory Visit (INDEPENDENT_AMBULATORY_CARE_PROVIDER_SITE_OTHER): Payer: Medicare Other | Admitting: Urology

## 2020-03-16 VITALS — BP 126/75 | HR 71 | Ht 68.0 in | Wt 221.0 lb

## 2020-03-16 DIAGNOSIS — N401 Enlarged prostate with lower urinary tract symptoms: Secondary | ICD-10-CM

## 2020-03-16 DIAGNOSIS — Z8042 Family history of malignant neoplasm of prostate: Secondary | ICD-10-CM | POA: Diagnosis not present

## 2020-03-16 DIAGNOSIS — N5201 Erectile dysfunction due to arterial insufficiency: Secondary | ICD-10-CM | POA: Diagnosis not present

## 2020-03-16 DIAGNOSIS — R350 Frequency of micturition: Secondary | ICD-10-CM

## 2020-03-16 MED ORDER — SILDENAFIL CITRATE 20 MG PO TABS: ORAL_TABLET | ORAL | 1 refills | Status: DC

## 2020-03-16 MED ORDER — FINASTERIDE 5 MG PO TABS: 5.0000 mg | ORAL_TABLET | Freq: Every day | ORAL | 3 refills | Status: DC

## 2020-03-16 MED ORDER — TAMSULOSIN HCL 0.4 MG PO CAPS: 0.4000 mg | ORAL_CAPSULE | Freq: Every day | ORAL | 3 refills | Status: DC

## 2020-03-16 NOTE — Progress Notes (Signed)
03/16/2020 11:37 AM   Curtis Gregory 1950/05/18 [id number]  Referring provider: Olin Hauser, DO [address] [place],   [address]  Chief Complaint  Patient presents with  . Benign Prostatic Hypertrophy    Urologic history: 1.  BPH with lower urinary tract symptoms -Mild to moderate LUTS IPSS 10/35 -Combination therapy tamsulosin/finasteride  2.  Erectile dysfunction -Sildenafil  HPI: 70 y.o. male presents for annual follow-up.   No significant change in his voiding symptoms since last years visit  Denies dysuria or gross hematuria  Denies flank, abdominal or pelvic pain  Stable ED on sildenafil  PSA 03/05/2020 stable at 0.4   PMH: Past Medical History:  Diagnosis Date  . Asthma   . Benign prostatic hypertrophy with urinary frequency 06/16/2014  . Excessive urination at night 06/16/2014  . GERD (gastroesophageal reflux disease)   . Glaucoma   . Hyperlipemia   . Hypertension   . Internal and external thrombosed hemorrhoids 03/15/2019  . Sleep apnea     Surgical History: Past Surgical History:  Procedure Laterality Date  . BOTOX INJECTION N/A 08/05/2019   Procedure: BOTOX INJECTION;  Surgeon: Ronny Bacon, MD;  Location: [facility];  Service: General;  Laterality: N/A;  . COLONOSCOPY WITH PROPOFOL N/A 09/11/2016   Procedure: COLONOSCOPY WITH PROPOFOL;  Surgeon: Lucilla Lame, MD;  Location: [facility];  Service: Gastroenterology;  Laterality: N/A;  sleep apnea  . HEMORRHOID SURGERY N/A 03/30/2019   Procedure: HEMORRHOIDECTOMY;  Surgeon: Ronny Bacon, MD;  Location: [facility];  Service: General;  Laterality: N/A;  . none    . POLYPECTOMY N/A 09/11/2016   Procedure: POLYPECTOMY;  Surgeon: Lucilla Lame, MD;  Location: [facility];  Service: Gastroenterology;  Laterality: N/A;  . SPHINCTEROTOMY N/A 08/05/2019   Procedure: SPHINCTEROTOMY, Chemical anal;  Surgeon: Ronny Bacon, MD;  Location: [facility];  Service: General;  Laterality:  N/A;    Home Medications:  Allergies as of 03/16/2020   No Known Allergies     Medication List       Accurate as of March 16, 2020 11:37 AM. If you have any questions, ask your nurse or doctor.        albuterol 108 (90 Base) MCG/ACT inhaler Commonly known as: VENTOLIN HFA Inhale 1-2 puffs into the lungs every 6 (six) hours as needed for wheezing or shortness of breath. Reported on 06/04/2015   fexofenadine 180 MG tablet Commonly known as: ALLEGRA TAKE 1 TABLET BY MOUTH EVERY DAY What changed: when to take this   finasteride 5 MG tablet Commonly known as: PROSCAR Take 1 tablet (5 mg total) by mouth daily. What changed: when to take this   fluticasone 50 MCG/ACT nasal spray Commonly known as: FLONASE Place 2 sprays into both nostrils daily. Use for 4-6 weeks then stop and use seasonally or as needed. What changed:   when to take this  reasons to take this   hydrochlorothiazide 12.5 MG tablet Commonly known as: HYDRODIURIL TAKE 1 TABLET BY MOUTH EVERY DAY   hydrocortisone 25 MG suppository Commonly known as: ANUSOL-HC Place 1 suppository (25 mg total) rectally 2 (two) times daily. For 7 days   ibuprofen 200 MG tablet Commonly known as: ADVIL Take 400 mg by mouth every 8 (eight) hours as needed (pain.).   ipratropium 0.06 % nasal spray Commonly known as: ATROVENT USE 2 SPRAYS IN EACH NOSTRIL FOUR TIMES DAILY FOR UP TO 5 TO 7 DAYS THEN STOP What changed: See the new instructions.   latanoprost  0.005 % ophthalmic solution Commonly known as: XALATAN Place 1 drop into both eyes at bedtime.   NONFORMULARY OR COMPOUNDED ITEM Place 1 application rectally 3 (three) times daily as needed (hemmorroids.). Nifedipine 0.3-Lidocaine 1.3-Petroleum ointment   omeprazole 20 MG capsule Commonly known as: PRILOSEC TAKE 1 CAPSULE BY MOUTH EVERY DAY   rosuvastatin 20 MG tablet Commonly known as: CRESTOR Take 1 tablet (20 mg total) by mouth at bedtime.   sildenafil 20 MG  tablet Commonly known as: REVATIO TAKE TWO TO FIVE TABLETS BY MOUTH ONE HOUR PRIOR TO INTERCOURSE   tamsulosin 0.4 MG Caps capsule Commonly known as: FLOMAX Take 1 capsule (0.4 mg total) by mouth daily after breakfast.   timolol 0.5 % ophthalmic gel-forming Commonly known as: TIMOPTIC-XR Place 1 drop into the left eye at bedtime.       Allergies: No Known Allergies  Family History: Family History  Problem Relation Age of Onset  . Diabetes Mother   . Heart disease Mother   . Alzheimer's disease Father   . Prostate cancer Father   . Prostate cancer Brother   . Kidney cancer Neg Hx     Social History:  reports that he has never smoked. He has never used smokeless tobacco. He reports current alcohol use of about 1.0 standard drink of alcohol per week. He reports that he does not use drugs.   Physical Exam: BP 126/75   Pulse 71   Ht 5\' 8"  (1.727 m)   Wt 221 lb (100.2 kg)   BMI 33.60 kg/m   Constitutional:  Alert and oriented, No acute distress. HEENT: [place] AT, moist mucus membranes.  Trachea midline, no masses. Cardiovascular: No clubbing, cyanosis, or edema. Respiratory: Normal respiratory effort, no increased work of breathing. GI: Abdomen is soft, nontender, nondistended, no abdominal masses GU: Very tight anal canal and difficult to adequately assess prostate by digital Skin: No rashes, bruises or suspicious lesions. Neurologic: Grossly intact, no focal deficits, moving all 4 extremities. Psychiatric: Normal mood and affect.   Assessment & Plan:    1.  BPH with LUTS  Stable on tamsulosin/finasteride  Refill sent to pharmacy  Continue annual follow-up  2.  Erectile dysfunction  Stable on sildenafil  Refill sent to pharmacy  3.  Family history prostate cancer  Stable PSA at 0.4  Tight anal canal and DRE suboptimal  Patient states he has had problems since hemorrhoid surgery 1 year ago  Will defer future DRE unless his PSA starts to rise   Abbie Sons, MD  [facility] [address], [place], [place] [address] [phone]

## 2020-03-17 ENCOUNTER — Encounter: Payer: Self-pay | Admitting: Urology

## 2020-03-26 ENCOUNTER — Other Ambulatory Visit: Payer: Self-pay | Admitting: Urology

## 2020-03-26 DIAGNOSIS — N401 Enlarged prostate with lower urinary tract symptoms: Secondary | ICD-10-CM

## 2020-04-02 ENCOUNTER — Ambulatory Visit (INDEPENDENT_AMBULATORY_CARE_PROVIDER_SITE_OTHER): Payer: Medicare Other | Admitting: Family Medicine

## 2020-04-02 ENCOUNTER — Other Ambulatory Visit: Payer: Self-pay

## 2020-04-02 ENCOUNTER — Encounter: Payer: Self-pay | Admitting: Family Medicine

## 2020-04-02 ENCOUNTER — Other Ambulatory Visit: Payer: Self-pay | Admitting: *Deleted

## 2020-04-02 VITALS — BP 124/74 | HR 70 | Ht 68.5 in | Wt 224.2 lb

## 2020-04-02 DIAGNOSIS — N401 Enlarged prostate with lower urinary tract symptoms: Secondary | ICD-10-CM

## 2020-04-02 DIAGNOSIS — R350 Frequency of micturition: Secondary | ICD-10-CM

## 2020-04-02 DIAGNOSIS — M7061 Trochanteric bursitis, right hip: Secondary | ICD-10-CM

## 2020-04-02 DIAGNOSIS — M25551 Pain in right hip: Secondary | ICD-10-CM

## 2020-04-02 MED ORDER — FINASTERIDE 5 MG PO TABS: 5.0000 mg | ORAL_TABLET | Freq: Every day | ORAL | 3 refills | Status: DC

## 2020-04-02 MED ORDER — LIDOCAINE HCL (PF) 1 % IJ SOLN
4.0000 mL | Freq: Once | INTRAMUSCULAR | Status: AC
  Administered 2020-04-02: 4 mL

## 2020-04-02 MED ORDER — METHYLPREDNISOLONE ACETATE 40 MG/ML IJ SUSP
40.0000 mg | Freq: Once | INTRAMUSCULAR | Status: AC
  Administered 2020-04-02: 40 mg via INTRA_ARTICULAR

## 2020-04-02 MED ORDER — TAMSULOSIN HCL 0.4 MG PO CAPS: 0.4000 mg | ORAL_CAPSULE | Freq: Every day | ORAL | 3 refills | Status: DC

## 2020-04-02 NOTE — Progress Notes (Signed)
Subjective:    Patient ID: Curtis Gregory, male    DOB: [birth year], 70 y.o.   MRN: [id number]  Curtis Gregory is a 70 y.o. 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 Describes worse pain with inc activity and ambulation, worse by end of day Travel driving to [place] tomorrow, needs quicker relief Would like to see Orthopedic as well, wife sees Dr Rudene Christians at [facility] Denies fall or other injury   Depression screen [name] 2/9 01/24/2020 03/15/2019 01/04/2019  Decreased Interest 0 0 0  Down, Depressed, Hopeless 0 0 0  PHQ - 2 Score 0 0 0    Social History   Tobacco Use  . Smoking status: Never Smoker  . Smokeless tobacco: Never Used  Vaping Use  . Vaping Use: Never used  Substance Use Topics  . Alcohol use: Yes    Alcohol/week: 1.0 standard drink    Types: 1 Glasses of wine per week    Comment: occasional  . Drug use: No    Review of Systems Per HPI unless specifically indicated above     Objective:    BP 124/74 (BP Location: Left Arm, Patient Position: Sitting, Cuff Size: Normal)   Pulse 70   Ht 5' 8.5" (1.74 m)   Wt 224 lb 3.2 oz (101.7 kg)   BMI 33.59 kg/m   Wt Readings from Last 3 Encounters:  04/02/20 224 lb 3.2 oz (101.7 kg)  03/16/20 221 lb (100.2 kg)  01/24/20 220 lb (99.8 kg)   ________________________________________________________ PROCEDURE NOTE Date: 04/02/20 Right greater trochanter injection Discussed benefits and risks (including pain, bleeding, infection, steroid flare). Verbal consent given by patient. Medication:  1 cc Depo-medrol 40mg  and 4 cc Lidocaine 1% without epi Time Out taken  Patient in RIGHT lateral decubitus position with affected RIGHT hip superior. Landmarks identified over RIGHT greater trochanter. Palpated to  identify point of maximum tenderness over bony process. Area cleansed with alcohol wipes. Using 21 gauage and 1, 1/2 inch needle, Right trochanteric bursa space was injected (with above listed medication) with needle to bone contact then slightly withdrawn to inject. cold spray used for superficial anesthetic. Sterile bandage placed. Patient tolerated procedure well without bleeding or paresthesias. No complications.   Physical Exam Vitals and nursing note reviewed.  Constitutional:      General: He is not in acute distress.    Appearance: He is well-developed. He is not diaphoretic.     Comments: Well-appearing, comfortable, cooperative  HENT:     Head: Normocephalic and atraumatic.  Eyes:     General:        Right eye: No discharge.        Left eye: No discharge.     Conjunctiva/sclera: Conjunctivae normal.  Cardiovascular:     Rate and Rhythm: Normal rate.  Pulmonary:     Effort: Pulmonary effort is normal.  Musculoskeletal:     Comments: Right Hip Localized reproducible tenderness over R greater trochanter laterally. Has full range of motion hip flex extension. Strength intact  Skin:    General: Skin is warm and dry.     Findings: No erythema or rash.  Neurological:     Mental Status: He is alert and oriented to person, place, and time.  Psychiatric:  Behavior: Behavior normal.     Comments: Well groomed, good eye contact, normal speech and thoughts    Results for orders placed or performed in visit on 03/05/20  PSA  Result Value Ref Range   Prostate Specific Ag, Serum 0.4 0.0 - 4.0 ng/mL      Assessment & Plan:   Problem List Items Addressed This Visit   None   Visit Diagnoses    Trochanteric bursitis of right hip    -  Primary   Relevant Medications   lidocaine (PF) (XYLOCAINE) 1 % injection 4 mL (Completed)   methylPREDNISolone acetate (DEPO-MEDROL) injection 40 mg (Completed)   Other Relevant Orders   Ambulatory referral to Orthopedic Surgery   Right  hip pain       Relevant Medications   lidocaine (PF) (XYLOCAINE) 1 % injection 4 mL (Completed)   methylPREDNISolone acetate (DEPO-MEDROL) injection 40 mg (Completed)   Other Relevant Orders   Ambulatory referral to Orthopedic Surgery      Acute on chronic gradual worsening problem with R Hip pain, likely trochanteric bursitis given history and exam with point tenderness. Suspect underlying mild osteoarthritis as possible cause, without known injury or trauma. H/o OA in other joints. No prior hip problems or imaging. - No radiation of pain or radicular symptoms - Inadequate conservative therapy   Plan: 1. Start with R trochanteric bursa injection today, tolerated well 2. May use Tylenol PRN for breakthrough 3. Encouraged use of heating pad 1-2x daily for now then PRN.  Referral to [facility] This Encounter  Procedures  . Ambulatory referral to Orthopedic Surgery    Referral Priority:   Routine    Referral Type:   Surgical    Referral Reason:   Specialty Services Required    Requested Specialty:   Orthopedic Surgery    Number of Visits Requested:   1      Meds ordered this encounter  Medications  . lidocaine (PF) (XYLOCAINE) 1 % injection 4 mL  . methylPREDNISolone acetate (DEPO-MEDROL) injection 40 mg    Follow up plan: Return if symptoms worsen or fail to improve.   [name], [facility] 04/02/2020, 3:36 PM

## 2020-04-02 NOTE — Patient Instructions (Addendum)
Thank you for coming to the office today.  Referral to Dr Lauro [facility]  Trochanteric [place] steroid injection on 04/02/20  You received a Right Hip Joint steroid injection today. - Lidocaine numbing medicine may ease the pain initially for a few hours until it wears off - As discussed, you may experience a "steroid flare" this evening or within 24-48 hours, anytime medicine is injected into an inflamed joint it can cause the pain to get worse temporarily - Everyone responds differently to these injections, it depends on the patient and the severity of the joint problem, it may provide anywhere from days to weeks, to months of relief. Ideal response is >6 months relief - Try to take it easy for next 1-2 days, avoid over activity and strain on joint (limit walking for hip) - Recommend the following:   - For swelling - rest, compression sleeve / ACE wrap, elevation, and ice packs as needed for first few days   - For pain in future may use heating pad or moist heat as needed   Please schedule a Follow-up Appointment to: Return if symptoms worsen or fail to improve.  If you have any other questions or concerns, please feel free to call the office or send a message through [facility]. You may also schedule an earlier appointment if necessary.  Additionally, you may be receiving a survey about your experience at our office within a few days to 1 week by e-mail or mail. We value your feedback.  [name], DO [place]

## 2020-04-10 DIAGNOSIS — H2513 Age-related nuclear cataract, bilateral: Secondary | ICD-10-CM | POA: Diagnosis not present

## 2020-04-10 DIAGNOSIS — H3561 Retinal hemorrhage, right eye: Secondary | ICD-10-CM | POA: Diagnosis not present

## 2020-04-10 DIAGNOSIS — H401233 Low-tension glaucoma, bilateral, severe stage: Secondary | ICD-10-CM | POA: Diagnosis not present

## 2020-04-10 DIAGNOSIS — H16103 Unspecified superficial keratitis, bilateral: Secondary | ICD-10-CM | POA: Diagnosis not present

## 2020-04-13 DIAGNOSIS — H2513 Age-related nuclear cataract, bilateral: Secondary | ICD-10-CM | POA: Diagnosis not present

## 2020-04-13 DIAGNOSIS — H401233 Low-tension glaucoma, bilateral, severe stage: Secondary | ICD-10-CM | POA: Diagnosis not present

## 2020-04-13 DIAGNOSIS — H16103 Unspecified superficial keratitis, bilateral: Secondary | ICD-10-CM | POA: Diagnosis not present

## 2020-04-13 DIAGNOSIS — H3561 Retinal hemorrhage, right eye: Secondary | ICD-10-CM | POA: Diagnosis not present

## 2020-04-15 ENCOUNTER — Other Ambulatory Visit: Payer: Self-pay | Admitting: Family Medicine

## 2020-04-15 DIAGNOSIS — E7849 Other hyperlipidemia: Secondary | ICD-10-CM

## 2020-04-15 NOTE — Telephone Encounter (Signed)
Requested medication (s) are due for refill today: yes  Requested medication (s) are on the active medication list: yes  Last refill:  09/29/19  Future visit scheduled: no  Notes to clinic:  overdue lab work   Requested Prescriptions  Pending Prescriptions Disp Refills   rosuvastatin (CRESTOR) 20 MG tablet [Pharmacy Med Name: ROSUVASTATIN CALCIUM 20 MG TAB] 90 tablet 1    Sig: TAKE 1 TABLET BY MOUTH EVERYDAY AT BEDTIME      Cardiovascular:  Antilipid - Statins Failed - 04/15/2020  9:55 AM      Failed - Total Cholesterol in normal range and within 360 days    Cholesterol  Date Value Ref Range Status  09/08/2017 157 <200 mg/dL Final          Failed - LDL in normal range and within 360 days    LDL Cholesterol (Calc)  Date Value Ref Range Status  09/08/2017 92 mg/dL (calc) Final    Comment:    Reference range: <100 . Desirable range <100 mg/dL for primary prevention;   <70 mg/dL for patients with CHD or diabetic patients  with > or = 2 CHD risk factors. [name] Kitchen LDL-C is now calculated using the Martin-Hopkins  calculation, which is a validated novel method providing  better accuracy than the Friedewald equation in the  estimation of LDL-C.  Cresenciano Genre et al. Annamaria Helling. 6333;545(62): 2061-2068  (http://education.[employer].com/faq/FAQ164)           Failed - HDL in normal range and within 360 days    HDL  Date Value Ref Range Status  09/08/2017 51 >40 mg/dL Final          Failed - Triglycerides in normal range and within 360 days    Triglycerides  Date Value Ref Range Status  09/08/2017 53 <150 mg/dL Final          Passed - Patient is not pregnant      Passed - Valid encounter within last 12 months    Recent Outpatient Visits           1 week ago Trochanteric bursitis of right hip   [place], DO   7 months ago Anal fissure   [place], DO   1 year ago Thrombosed external  hemorrhoid   [place], DO   1 year ago Chronic pain of right knee   [place], DO   1 year ago Chronic pain of right knee   [place], DO       Future Appointments             In 9 months  [facility], [place]   In 11 months [place], Ronda Fairly, [place]

## 2020-04-24 DIAGNOSIS — Z23 Encounter for immunization: Secondary | ICD-10-CM | POA: Diagnosis not present

## 2020-05-01 ENCOUNTER — Other Ambulatory Visit: Payer: Self-pay | Admitting: Family Medicine

## 2020-05-01 DIAGNOSIS — K219 Gastro-esophageal reflux disease without esophagitis: Secondary | ICD-10-CM

## 2020-05-16 ENCOUNTER — Other Ambulatory Visit: Payer: Self-pay | Admitting: Family Medicine

## 2020-05-16 DIAGNOSIS — I10 Essential (primary) hypertension: Secondary | ICD-10-CM

## 2020-05-16 NOTE — Telephone Encounter (Signed)
Courtesy refill. Patient needs to be seen in the office. Requested Prescriptions  Pending Prescriptions Disp Refills  . hydrochlorothiazide (HYDRODIURIL) 12.5 MG tablet [Pharmacy Med Name: HYDROCHLOROTHIAZIDE 12.5 MG TB] 30 tablet 0    Sig: TAKE 1 TABLET BY MOUTH EVERY DAY     Cardiovascular: Diuretics - Thiazide Failed - 05/16/2020  1:50 AM      Failed - Ca in normal range and within 360 days    Calcium  Date Value Ref Range Status  07/28/2019 8.7 (L) 8.9 - 10.3 mg/dL Final         Passed - Cr in normal range and within 360 days    Creat  Date Value Ref Range Status  09/08/2017 1.08 0.70 - 1.25 mg/dL Final    Comment:    For patients >72 years of age, the reference limit for Creatinine is approximately 13% higher for people identified as African-American. .    Creatinine, Ser  Date Value Ref Range Status  07/28/2019 1.16 0.61 - 1.24 mg/dL Final         Passed - K in normal range and within 360 days    Potassium  Date Value Ref Range Status  07/28/2019 3.6 3.5 - 5.1 mmol/L Final         Passed - Na in normal range and within 360 days    Sodium  Date Value Ref Range Status  07/28/2019 137 135 - 145 mmol/L Final         Passed - Last BP in normal range    BP Readings from Last 1 Encounters:  04/02/20 124/74         Passed - Valid encounter within last 6 months    Recent Outpatient Visits          1 month ago Trochanteric bursitis of right hip   [place], DO   8 months ago Anal fissure   [place], DO   1 year ago Thrombosed external hemorrhoid   [place], DO   1 year ago Chronic pain of right knee   [place], DO   1 year ago Chronic pain of right knee   [place], Devonne Doughty, DO      Future Appointments            In 8 months  [facility],  [place]   In 10 months [place], Ronda Fairly, [facility]

## 2020-06-16 ENCOUNTER — Other Ambulatory Visit: Payer: Self-pay | Admitting: Family Medicine

## 2020-06-16 DIAGNOSIS — I10 Essential (primary) hypertension: Secondary | ICD-10-CM

## 2020-06-16 NOTE — Telephone Encounter (Signed)
Last RF on 05/16/20 #30 day courtesy refill. Pt has not made any appointments. Sent pt message via MyChart to call office and make appt.  Requested Prescriptions  Pending Prescriptions Disp Refills  . hydrochlorothiazide (HYDRODIURIL) 12.5 MG tablet [Pharmacy Med Name: HYDROCHLOROTHIAZIDE 12.5 MG TB] 30 tablet 0    Sig: TAKE 1 TABLET BY MOUTH EVERY DAY     Cardiovascular: Diuretics - Thiazide Failed - 06/16/2020  9:33 AM      Failed - Ca in normal range and within 360 days    Calcium  Date Value Ref Range Status  07/28/2019 8.7 (L) 8.9 - 10.3 mg/dL Final         Passed - Cr in normal range and within 360 days    Creat  Date Value Ref Range Status  09/08/2017 1.08 0.70 - 1.25 mg/dL Final    Comment:    For patients >16 years of age, the reference limit for Creatinine is approximately 13% higher for people identified as African-American. .    Creatinine, Ser  Date Value Ref Range Status  07/28/2019 1.16 0.61 - 1.24 mg/dL Final         Passed - K in normal range and within 360 days    Potassium  Date Value Ref Range Status  07/28/2019 3.6 3.5 - 5.1 mmol/L Final         Passed - Na in normal range and within 360 days    Sodium  Date Value Ref Range Status  07/28/2019 137 135 - 145 mmol/L Final         Passed - Last BP in normal range    BP Readings from Last 1 Encounters:  04/02/20 124/74         Passed - Valid encounter within last 6 months    Recent Outpatient Visits          2 months ago Trochanteric bursitis of right hip   [place], DO   9 months ago Anal fissure   [place], DO   1 year ago Thrombosed external hemorrhoid   [place], DO   1 year ago Chronic pain of right knee   [place], DO   1 year ago Chronic pain of right knee   [place], Devonne Doughty, DO       Future Appointments            In 7 months  [facility], [place]   In 9 months [place], Ronda Fairly, [facility]

## 2020-07-02 ENCOUNTER — Other Ambulatory Visit: Payer: Self-pay | Admitting: Family Medicine

## 2020-07-02 ENCOUNTER — Other Ambulatory Visit: Payer: Self-pay | Admitting: Student

## 2020-07-02 DIAGNOSIS — M25551 Pain in right hip: Secondary | ICD-10-CM | POA: Diagnosis not present

## 2020-07-02 DIAGNOSIS — M87052 Idiopathic aseptic necrosis of left femur: Secondary | ICD-10-CM

## 2020-07-02 DIAGNOSIS — I10 Essential (primary) hypertension: Secondary | ICD-10-CM

## 2020-07-02 DIAGNOSIS — M1611 Unilateral primary osteoarthritis, right hip: Secondary | ICD-10-CM | POA: Diagnosis not present

## 2020-07-02 NOTE — Telephone Encounter (Signed)
   Notes to clinic: Patient requesting a 90 day supply   Requested Prescriptions  Pending Prescriptions Disp Refills   hydrochlorothiazide (HYDRODIURIL) 12.5 MG tablet [Pharmacy Med Name: HYDROCHLOROTHIAZIDE 12.5 MG TB] 90 tablet 1    Sig: TAKE 1 TABLET BY MOUTH EVERY DAY      Cardiovascular: Diuretics - Thiazide Failed - 07/02/2020  8:44 AM      Failed - Ca in normal range and within 360 days    Calcium  Date Value Ref Range Status  07/28/2019 8.7 (L) 8.9 - 10.3 mg/dL Final          Passed - Cr in normal range and within 360 days    Creat  Date Value Ref Range Status  09/08/2017 1.08 0.70 - 1.25 mg/dL Final    Comment:    For patients >16 years of age, the reference limit for Creatinine is approximately 13% higher for people identified as African-American. .    Creatinine, Ser  Date Value Ref Range Status  07/28/2019 1.16 0.61 - 1.24 mg/dL Final          Passed - K in normal range and within 360 days    Potassium  Date Value Ref Range Status  07/28/2019 3.6 3.5 - 5.1 mmol/L Final          Passed - Na in normal range and within 360 days    Sodium  Date Value Ref Range Status  07/28/2019 137 135 - 145 mmol/L Final          Passed - Last BP in normal range    BP Readings from Last 1 Encounters:  04/02/20 124/74          Passed - Valid encounter within last 6 months    Recent Outpatient Visits           3 months ago Trochanteric bursitis of right hip   [place], DO   10 months ago Anal fissure   [place], DO   1 year ago Thrombosed external hemorrhoid   [place], DO   1 year ago Chronic pain of right knee   [place], DO   1 year ago Chronic pain of right knee   [facility] [place], Devonne Doughty, DO       Future Appointments             In 2 days  [place], Devonne Doughty, [facility], [place]   In 7 months  [facility], [place]   In 8 months [place], Ronda Fairly, [place]

## 2020-07-04 ENCOUNTER — Other Ambulatory Visit: Payer: Self-pay

## 2020-07-04 ENCOUNTER — Ambulatory Visit (INDEPENDENT_AMBULATORY_CARE_PROVIDER_SITE_OTHER): Payer: Medicare Other | Admitting: Family Medicine

## 2020-07-04 ENCOUNTER — Encounter: Payer: Self-pay | Admitting: Family Medicine

## 2020-07-04 ENCOUNTER — Other Ambulatory Visit: Payer: Self-pay | Admitting: Family Medicine

## 2020-07-04 ENCOUNTER — Ambulatory Visit
Admission: RE | Admit: 2020-07-04 | Discharge: 2020-07-04 | Disposition: A | Discharge status: Home / Self Care | Payer: Medicare Other | Source: Ambulatory Visit | Attending: Student | Admitting: Student

## 2020-07-04 VITALS — BP 121/63 | HR 63 | Ht 68.5 in | Wt 219.8 lb

## 2020-07-04 DIAGNOSIS — M87052 Idiopathic aseptic necrosis of left femur: Secondary | ICD-10-CM | POA: Diagnosis not present

## 2020-07-04 DIAGNOSIS — Z7189 Other specified counseling: Secondary | ICD-10-CM

## 2020-07-04 DIAGNOSIS — I10 Essential (primary) hypertension: Secondary | ICD-10-CM | POA: Diagnosis not present

## 2020-07-04 DIAGNOSIS — M1612 Unilateral primary osteoarthritis, left hip: Secondary | ICD-10-CM | POA: Diagnosis not present

## 2020-07-04 DIAGNOSIS — R351 Nocturia: Secondary | ICD-10-CM

## 2020-07-04 DIAGNOSIS — R7309 Other abnormal glucose: Secondary | ICD-10-CM

## 2020-07-04 NOTE — Assessment & Plan Note (Signed)
Well-controlled HTN - Home BP readings reviewed  No known complications    Plan:  1.  Continue current BP regimen - HCTZ 12.5mg  daily, add refills 2. Encourage improved lifestyle - low sodium diet, regular exercise 3. Continue monitor BP outside office, bring readings to next visit, if persistently >140/90 or new symptoms notify office sooner

## 2020-07-04 NOTE — Progress Notes (Signed)
Subjective:    Patient ID: Curtis Gregory, male    DOB: Mar 26, 1950, 70 y.o.   MRN: [id number]  Curtis Gregory is a 70 y.o. male presenting on 07/04/2020 for Hypertension   HPI  CHRONIC HTN: Reports he is doing well. Avg BP recently 120s, in past it was lowered from 25 to 12.5mg  Current Meds - HCTZ 12.5mg  daily needs refill authorization Reports good compliance, took meds today. Tolerating well, w/o complaints. Denies CP, dyspnea, HA, edema, dizziness / lightheadedness  HYPERLIPIDEMIA: - Reports no concerns. Last lipid panel 2020, controlled due for repeat panel - Currently taking Rosuvastatin 20mg , tolerating well without side effects or myalgias  He has experienced a lot of family and friend losses/deaths in the past few years. He is interested in scheduling with a grief counselor / therapist. He denies active depression or anxiety   Depression screen [facility] 2/9 07/04/2020 01/24/2020 03/15/2019  Decreased Interest 0 0 0  Down, Depressed, Hopeless 1 0 0  PHQ - 2 Score 1 0 0  Altered sleeping 1 - -  Tired, decreased energy 0 - -  Change in appetite 0 - -  Feeling bad or failure about yourself  1 - -  Trouble concentrating 0 - -  Moving slowly or fidgety/restless 0 - -  Suicidal thoughts 0 - -  PHQ-9 Score 3 - -  Difficult doing work/chores Not difficult at all - -    Social History   Tobacco Use   Smoking status: Never   Smokeless tobacco: Never  Vaping Use   Vaping Use: Never used  Substance Use Topics   Alcohol use: Yes    Alcohol/week: 1.0 standard drink    Types: 1 Glasses of wine per week    Comment: occasional   Drug use: No    Review of Systems Per HPI unless specifically indicated above     Objective:    BP 121/63   Pulse 63   Ht 5' 8.5" (1.74 m)   Wt 219 lb 12.8 oz (99.7 kg)   SpO2 100%   BMI 32.93 kg/m   Wt Readings from Last 3 Encounters:  07/04/20 219 lb 12.8 oz (99.7 kg)  04/02/20 224 lb 3.2 oz (101.7 kg)  03/16/20 221 lb (100.2 kg)     Physical Exam Vitals and nursing note reviewed.  Constitutional:      General: He is not in acute distress.    Appearance: Normal appearance. He is well-developed. He is not diaphoretic.     Comments: Well-appearing, comfortable, cooperative  HENT:     Head: Normocephalic and atraumatic.  Eyes:     General:        Right eye: No discharge.        Left eye: No discharge.     Conjunctiva/sclera: Conjunctivae normal.  Cardiovascular:     Rate and Rhythm: Normal rate.  Pulmonary:     Effort: Pulmonary effort is normal.  Skin:    General: Skin is warm and dry.     Findings: No erythema or rash.  Neurological:     Mental Status: He is alert and oriented to person, place, and time.  Psychiatric:        Mood and Affect: Mood normal.        Behavior: Behavior normal.        Thought Content: Thought content normal.     Comments: Well groomed, good eye contact, normal speech and thoughts   Results for orders placed or performed in visit on  03/05/20  PSA  Result Value Ref Range   Prostate Specific Ag, Serum 0.4 0.0 - 4.0 ng/mL      Assessment & Plan:   Problem List Items Addressed This Visit     Essential hypertension - Primary    Well-controlled HTN - Home BP readings reviewed  No known complications    Plan:  1.  Continue current BP regimen - HCTZ 12.5mg  daily, add refills 2. Encourage improved lifestyle - low sodium diet, regular exercise 3. Continue monitor BP outside office, bring readings to next visit, if persistently >140/90 or new symptoms notify office sooner       Other Visit Diagnoses     Bereavement counseling           Will give handout on therapist for grief counseling. He will look through list and check insurance and notify us if need a doctor's order for referral or he may self refer. Can try [facility] as well as bereavement counseling.  No orders of the defined types were placed in this encounter.     Follow up plan: Return in about 3 months  (around 10/04/2020) for 3 month fasting lab only then 1 week later [facility].  Future labs ordered for 10/15/20   [name], [facility] 07/04/2020, 11:02 AM

## 2020-07-04 NOTE — Patient Instructions (Signed)
Thank you for coming to the office today.  PSYCHIATRY (AND THERAPY-COUSENLING)   [facility] Address: [address], [place], [place] [address] [email or web address] Phone: [phone]  [facility] ([place] [facility]) Address: [address], [place], [place] [address] Hours: 8:30AM-5PM Phone: [phone]  [facility] (All ages) [address] [place], [id number] Phone: [phone] (Option 1) [email or web address]  ----------------------------------------------------------------- THERAPIST ONLY   [place] or [place] - they do a lot of remote care  [email or web address]  [place] [place], [place] [address] Johnnette Litter P: [phone]  [facility], [address], [place]: [place].   Address: [place], [place], [place] [address] Hours: Open today  9AM-7PM Phone: [phone]  [facility] [address] [place] Address: [address] [place], [place], [place] [address] Phone: [phone]  Please schedule a Follow-up Appointment to: No follow-ups on file.  If you have any other questions or concerns, please feel free to call the office or send a message through [place]. You may also schedule an earlier appointment if necessary.  Additionally, you may be receiving a survey about your experience at our office within a few days to 1 week by e-mail or mail. We value your feedback.  [name], DO [place]

## 2020-07-13 DIAGNOSIS — M1611 Unilateral primary osteoarthritis, right hip: Secondary | ICD-10-CM | POA: Diagnosis not present

## 2020-07-19 ENCOUNTER — Other Ambulatory Visit: Payer: Self-pay | Admitting: Orthopedic Surgery

## 2020-08-01 ENCOUNTER — Encounter
Admission: RE | Admit: 2020-08-01 | Discharge: 2020-08-01 | Disposition: A | Discharge status: Home / Self Care | Payer: Medicare Other | Source: Ambulatory Visit | Attending: Orthopedic Surgery | Admitting: Orthopedic Surgery

## 2020-08-01 ENCOUNTER — Other Ambulatory Visit: Payer: Self-pay

## 2020-08-01 DIAGNOSIS — Z01818 Encounter for other preprocedural examination: Secondary | ICD-10-CM | POA: Diagnosis not present

## 2020-08-01 DIAGNOSIS — Z01810 Encounter for preprocedural cardiovascular examination: Secondary | ICD-10-CM | POA: Diagnosis not present

## 2020-08-01 LAB — COMPREHENSIVE METABOLIC PANEL
ALT: 14 U/L (ref 0–44)
AST: 20 U/L (ref 15–41)
Albumin: 3.7 g/dL (ref 3.5–5.0)
Alkaline Phosphatase: 47 U/L (ref 38–126)
Anion gap: 10 (ref 5–15)
BUN: 22 mg/dL (ref 8–23)
CO2: 27 mmol/L (ref 22–32)
Calcium: 8.4 mg/dL — ABNORMAL LOW (ref 8.9–10.3)
Chloride: 103 mmol/L (ref 98–111)
Creatinine, Ser: 1 mg/dL (ref 0.61–1.24)
GFR, Estimated: >60 mL/min (ref >60)
Glucose, Bld: 91 mg/dL (ref 70–99)
Potassium: 3.6 mmol/L (ref 3.5–5.1)
Sodium: 140 mmol/L (ref 135–145)
Total Bilirubin: 0.9 mg/dL (ref 0.3–1.2)
Total Protein: 7 g/dL (ref 6.5–8.1)

## 2020-08-01 LAB — URINALYSIS, ROUTINE W REFLEX MICROSCOPIC
Bilirubin Urine: NEGATIVE
Glucose, UA: NEGATIVE mg/dL
Hgb urine dipstick: NEGATIVE
Ketones, ur: NEGATIVE mg/dL
Leukocytes,Ua: NEGATIVE
Nitrite: NEGATIVE
Protein, ur: NEGATIVE mg/dL
Specific Gravity, Urine: 1.024 (ref 1.005–1.030)
pH: 6 (ref 5.0–8.0)

## 2020-08-01 LAB — TYPE AND SCREEN
ABO/RH(D): A POS
Antibody Screen: NEGATIVE

## 2020-08-01 LAB — CBC WITH DIFFERENTIAL/PLATELET
Abs Immature Granulocytes: 0.01 10*3/uL (ref 0.00–0.07)
Basophils Absolute: 0.1 10*3/uL (ref 0.0–0.1)
Basophils Relative: 2 %
Eosinophils Absolute: 0.2 10*3/uL (ref 0.0–0.5)
Eosinophils Relative: 5 %
HCT: 44.1 % (ref 39.0–52.0)
Hemoglobin: 14.6 g/dL (ref 13.0–17.0)
Immature Granulocytes: 0 %
Lymphocytes Relative: 46 %
Lymphs Abs: 1.6 10*3/uL (ref 0.7–4.0)
MCH: 30.7 pg (ref 26.0–34.0)
MCHC: 33.1 g/dL (ref 30.0–36.0)
MCV: 92.6 fL (ref 80.0–100.0)
Monocytes Absolute: 0.6 10*3/uL (ref 0.1–1.0)
Monocytes Relative: 17 %
Neutro Abs: 1 10*3/uL — ABNORMAL LOW (ref 1.7–7.7)
Neutrophils Relative %: 30 %
Platelets: 236 10*3/uL (ref 150–400)
RBC: 4.76 MIL/uL (ref 4.22–5.81)
RDW: 13 % (ref 11.5–15.5)
WBC: 3.4 10*3/uL — ABNORMAL LOW (ref 4.0–10.5)
nRBC: 0 % (ref 0.0–0.2)

## 2020-08-01 LAB — SURGICAL PCR SCREEN
MRSA, PCR: NEGATIVE
Staphylococcus aureus: NEGATIVE

## 2020-08-01 NOTE — Patient Instructions (Addendum)
Your procedure is scheduled on: August 09, 2020 THURSDAY Report to the Registration Desk on the 1st floor of the [facility]. To find out your arrival time, please call [phone] between 1PM - 3PM on: August 08, 2020 [facility]  REMEMBER: Instructions that are not followed completely may result in serious medical risk, up to and including death; or upon the discretion of your surgeon and anesthesiologist your surgery may need to be rescheduled.  Do not eat food after midnight the night before surgery.  No gum chewing, lozengers or hard candies.  You may however, drink CLEAR liquids up to 2 hours before you are scheduled to arrive for your surgery. Do not drink anything within 2 hours of your scheduled arrival time.  Clear liquids include: - water  - apple juice without pulp - gatorade (not RED, PURPLE, OR BLUE) - black coffee or tea (Do NOT add milk or creamers to the coffee or tea) Do NOT drink anything that is not on this list.  Type 1 and Type 2 diabetics should only drink water.  In addition, your doctor has ordered for you to drink the provided  Ensure Pre-Surgery Clear Carbohydrate Drink  Drinking this carbohydrate drink up to two hours before surgery helps to reduce insulin resistance and improve patient outcomes. Please complete drinking 2 hours prior to scheduled arrival time.  TAKE THESE MEDICATIONS THE MORNING OF SURGERY WITH A SIP OF WATER: FINASTERIDE TAMSULOSIN OMEPRAZOLE (take one the night before and one on the morning of surgery - helps to prevent nausea after surgery.)  Use inhalers AND FLONASE on the day of surgery   One week prior to surgery: Stop Anti-inflammatories (NSAIDS) such as Advil, Aleve, Ibuprofen, Motrin, Naproxen, Naprosyn and ASPIRIN AND Aspirin based products such as Excedrin, Goodys Powder, BC Powder. Stop ANY OVER THE COUNTER supplements until after surgery. You may however, continue to take Tylenol if needed for pain up until the day of  surgery.  No Alcohol for 24 hours before or after surgery.  No Smoking including e-cigarettes for 24 hours prior to surgery.  No chewable tobacco products for at least 6 hours prior to surgery.  No nicotine patches on the day of surgery.  Do not use any "recreational" drugs for at least a week prior to your surgery.  Please be advised that the combination of cocaine and anesthesia may have negative outcomes, up to and including death. If you test positive for cocaine, your surgery will be cancelled.  On the morning of surgery brush your teeth with toothpaste and water, you may rinse your mouth with mouthwash if you wish. Do not swallow any toothpaste or mouthwash.  Do not wear jewelry, make-up, hairpins, clips or nail polish.  Do not wear lotions, powders, or perfumes OR DEODORANT   Do not shave body from the neck down 48 hours prior to surgery just in case you cut yourself which could leave a site for infection.  Also, freshly shaved skin may become irritated if using the CHG soap.  Contact lenses, hearing aids and dentures may not be worn into surgery.  Do not bring valuables to the hospital. [facility] is not responsible for any missing/lost belongings or valuables.   Use CHG Soap as directed on instruction sheet.  Bring your C-PAP to the hospital with you in case you may have to spend the night.   Notify your doctor if there is any change in your medical condition (cold, fever, infection).  Wear comfortable clothing (specific to  your surgery type) to the hospital.  After surgery, you can help prevent lung complications by doing breathing exercises.  Take deep breaths and cough every 1-2 hours. Your doctor may order a device called an Incentive Spirometer to help you take deep breaths. When coughing or sneezing, hold a pillow firmly against your incision with both hands. This is called "splinting." Doing this helps protect your incision. It also decreases belly  discomfort.  If you are being admitted to the hospital overnight, YOU MAY [place]  If you are being discharged the day of surgery, you will not be allowed to drive home. You will need a responsible adult (18 years or older) to drive you home and stay with you that night.   Please call the [place] Dept. at [phone] if you have any questions about these instructions.  Surgery Visitation Policy:  Patients undergoing a surgery or procedure may have one family member or support person with them as long as that person is not COVID-19 positive or experiencing its symptoms.  That person may remain in the waiting area during the procedure.  Inpatient Visitation:    Visiting hours are 7 a.m. to 8 p.m. Inpatients will be allowed two visitors daily. The visitors may change each day during the patient's stay. No visitors under the age of 11. Any visitor under the age of 18 must be accompanied by an adult. The visitor must pass COVID-19 screenings, use hand sanitizer when entering and exiting the patient's room and wear a mask at all times, including in the patient's room. Patients must also wear a mask when staff or their visitor are in the room. Masking is required regardless of vaccination status.

## 2020-08-07 ENCOUNTER — Other Ambulatory Visit: Payer: Self-pay

## 2020-08-07 ENCOUNTER — Other Ambulatory Visit
Admission: RE | Admit: 2020-08-07 | Discharge: 2020-08-07 | Disposition: A | Discharge status: Home / Self Care | Payer: Medicare Other | Source: Ambulatory Visit | Attending: Orthopedic Surgery | Admitting: Orthopedic Surgery

## 2020-08-07 DIAGNOSIS — Z01812 Encounter for preprocedural laboratory examination: Secondary | ICD-10-CM | POA: Insufficient documentation

## 2020-08-07 DIAGNOSIS — Z20822 Contact with and (suspected) exposure to covid-19: Secondary | ICD-10-CM | POA: Insufficient documentation

## 2020-08-07 LAB — SARS CORONAVIRUS 2 (TAT 6-24 HRS): SARS Coronavirus 2: NEGATIVE

## 2020-08-09 ENCOUNTER — Inpatient Hospital Stay: Payer: Medicare Other

## 2020-08-09 ENCOUNTER — Inpatient Hospital Stay: Payer: Medicare Other | Admitting: Anesthesiology

## 2020-08-09 ENCOUNTER — Inpatient Hospital Stay
Admission: RE | Admit: 2020-08-09 | Discharge: 2020-08-13 | DRG: 470 | Disposition: A | Discharge status: Home Health | Payer: Medicare Other | Attending: Orthopedic Surgery | Admitting: Orthopedic Surgery

## 2020-08-09 ENCOUNTER — Other Ambulatory Visit: Payer: Self-pay

## 2020-08-09 ENCOUNTER — Encounter: Payer: Self-pay | Admitting: Orthopedic Surgery

## 2020-08-09 ENCOUNTER — Encounter
Admission: RE | Disposition: A | Discharge status: Home / Self Care | Payer: Self-pay | Source: Home / Self Care | Attending: Orthopedic Surgery

## 2020-08-09 DIAGNOSIS — Z419 Encounter for procedure for purposes other than remedying health state, unspecified: Secondary | ICD-10-CM

## 2020-08-09 DIAGNOSIS — Z96641 Presence of right artificial hip joint: Secondary | ICD-10-CM

## 2020-08-09 DIAGNOSIS — E785 Hyperlipidemia, unspecified: Secondary | ICD-10-CM | POA: Diagnosis present

## 2020-08-09 DIAGNOSIS — K219 Gastro-esophageal reflux disease without esophagitis: Secondary | ICD-10-CM | POA: Diagnosis present

## 2020-08-09 DIAGNOSIS — Z20822 Contact with and (suspected) exposure to covid-19: Secondary | ICD-10-CM | POA: Diagnosis present

## 2020-08-09 DIAGNOSIS — Z8042 Family history of malignant neoplasm of prostate: Secondary | ICD-10-CM

## 2020-08-09 DIAGNOSIS — Z8249 Family history of ischemic heart disease and other diseases of the circulatory system: Secondary | ICD-10-CM | POA: Diagnosis not present

## 2020-08-09 DIAGNOSIS — Z833 Family history of diabetes mellitus: Secondary | ICD-10-CM | POA: Diagnosis not present

## 2020-08-09 DIAGNOSIS — N400 Enlarged prostate without lower urinary tract symptoms: Secondary | ICD-10-CM | POA: Diagnosis present

## 2020-08-09 DIAGNOSIS — G8918 Other acute postprocedural pain: Secondary | ICD-10-CM

## 2020-08-09 DIAGNOSIS — I10 Essential (primary) hypertension: Secondary | ICD-10-CM | POA: Diagnosis present

## 2020-08-09 DIAGNOSIS — Z471 Aftercare following joint replacement surgery: Secondary | ICD-10-CM | POA: Diagnosis not present

## 2020-08-09 DIAGNOSIS — Z820 Family history of epilepsy and other diseases of the nervous system: Secondary | ICD-10-CM

## 2020-08-09 DIAGNOSIS — M1611 Unilateral primary osteoarthritis, right hip: Secondary | ICD-10-CM | POA: Diagnosis present

## 2020-08-09 DIAGNOSIS — G4730 Sleep apnea, unspecified: Secondary | ICD-10-CM | POA: Diagnosis not present

## 2020-08-09 HISTORY — PX: TOTAL HIP ARTHROPLASTY: SHX124

## 2020-08-09 LAB — CBC
HCT: 43.6 % (ref 39.0–52.0)
Hemoglobin: 14.7 g/dL (ref 13.0–17.0)
MCH: 32.2 pg (ref 26.0–34.0)
MCHC: 33.7 g/dL (ref 30.0–36.0)
MCV: 95.4 fL (ref 80.0–100.0)
Platelets: 246 10*3/uL (ref 150–400)
RBC: 4.57 MIL/uL (ref 4.22–5.81)
RDW: 12.8 % (ref 11.5–15.5)
WBC: 10.3 10*3/uL (ref 4.0–10.5)
nRBC: 0 % (ref 0.0–0.2)

## 2020-08-09 LAB — CREATININE, SERUM
Creatinine, Ser: 0.92 mg/dL (ref 0.61–1.24)
GFR, Estimated: >60 mL/min (ref >60)

## 2020-08-09 LAB — ABO/RH: ABO/RH(D): A POS

## 2020-08-09 SURGERY — ARTHROPLASTY, HIP, TOTAL, ANTERIOR APPROACH
Anesthesia: Spinal | Site: Hip | Laterality: Right

## 2020-08-09 MED ORDER — ACETAMINOPHEN 325 MG PO TABS: 325.0000 mg | ORAL_TABLET | Freq: Four times a day (QID) | ORAL | Status: DC | PRN

## 2020-08-09 MED ORDER — PROPOFOL 10 MG/ML IV BOLUS
INTRAVENOUS | Status: DC | PRN
  Administered 2020-08-09 (×2): 50 mg via INTRAVENOUS

## 2020-08-09 MED ORDER — ALUM & MAG HYDROXIDE-SIMETH 200-200-20 MG/5ML PO SUSP: 30.0000 mL | ORAL | Status: DC | PRN

## 2020-08-09 MED ORDER — ROSUVASTATIN CALCIUM 10 MG PO TABS
20.0000 mg | ORAL_TABLET | Freq: Every day | ORAL | Status: DC
  Administered 2020-08-10 – 2020-08-12 (×3): 20 mg via ORAL
  Filled 2020-08-09 (×4): qty 2

## 2020-08-09 MED ORDER — ACETAMINOPHEN 10 MG/ML IV SOLN: 1000.0000 mg | Freq: Once | INTRAVENOUS | Status: DC | PRN

## 2020-08-09 MED ORDER — FLUTICASONE PROPIONATE 50 MCG/ACT NA SUSP
2.0000 | Freq: Every day | NASAL | Status: DC | PRN
  Filled 2020-08-09: qty 16

## 2020-08-09 MED ORDER — DIPHENHYDRAMINE HCL 12.5 MG/5ML PO ELIX: 12.5000 mg | ORAL_SOLUTION | ORAL | Status: DC | PRN

## 2020-08-09 MED ORDER — DEXAMETHASONE SODIUM PHOSPHATE 10 MG/ML IJ SOLN
INTRAMUSCULAR | Status: DC | PRN
  Administered 2020-08-09: 10 mg via INTRAVENOUS

## 2020-08-09 MED ORDER — MORPHINE SULFATE (PF) 2 MG/ML IV SOLN
0.5000 mg | INTRAVENOUS | Status: DC | PRN
  Administered 2020-08-10: 1 mg via INTRAVENOUS
  Filled 2020-08-09: qty 1

## 2020-08-09 MED ORDER — POLYETHYLENE GLYCOL 3350 17 G PO PACK
17.0000 g | PACK | Freq: Every day | ORAL | Status: DC | PRN
  Administered 2020-08-10 – 2020-08-12 (×3): 17 g via ORAL
  Filled 2020-08-09 (×4): qty 1

## 2020-08-09 MED ORDER — CHLORHEXIDINE GLUCONATE 0.12 % MT SOLN
OROMUCOSAL | Status: AC
  Administered 2020-08-09: 15 mL via OROMUCOSAL
  Filled 2020-08-09: qty 15

## 2020-08-09 MED ORDER — METHOCARBAMOL 1000 MG/10ML IJ SOLN
500.0000 mg | Freq: Four times a day (QID) | INTRAVENOUS | Status: DC | PRN
  Filled 2020-08-09: qty 5

## 2020-08-09 MED ORDER — CEFAZOLIN SODIUM-DEXTROSE 2-4 GM/100ML-% IV SOLN
2.0000 g | Freq: Four times a day (QID) | INTRAVENOUS | Status: AC
  Administered 2020-08-10: 2 g via INTRAVENOUS
  Filled 2020-08-09 (×2): qty 100

## 2020-08-09 MED ORDER — TRAMADOL HCL 50 MG PO TABS
50.0000 mg | ORAL_TABLET | Freq: Four times a day (QID) | ORAL | Status: DC
Start: 2020-08-09 — End: 2020-08-12
  Administered 2020-08-09 – 2020-08-12 (×9): 50 mg via ORAL
  Filled 2020-08-09 (×13): qty 1

## 2020-08-09 MED ORDER — SODIUM CHLORIDE 0.9 % IV SOLN: INTRAVENOUS | Status: DC

## 2020-08-09 MED ORDER — BISACODYL 10 MG RE SUPP: 10.0000 mg | Freq: Every day | RECTAL | Status: DC | PRN

## 2020-08-09 MED ORDER — ORAL CARE MOUTH RINSE: 15.0000 mL | Freq: Once | OROMUCOSAL | Status: AC

## 2020-08-09 MED ORDER — ONDANSETRON HCL 4 MG/2ML IJ SOLN
INTRAMUSCULAR | Status: AC
  Filled 2020-08-09: qty 2

## 2020-08-09 MED ORDER — BUPIVACAINE HCL (PF) 0.5 % IJ SOLN
INTRAMUSCULAR | Status: DC | PRN
  Administered 2020-08-09: 2.5 mL

## 2020-08-09 MED ORDER — LATANOPROST 0.005 % OP SOLN
1.0000 [drp] | Freq: Every day | OPHTHALMIC | Status: DC
  Administered 2020-08-10 – 2020-08-12 (×3): 1 [drp] via OPHTHALMIC
  Filled 2020-08-09: qty 2.5

## 2020-08-09 MED ORDER — METOCLOPRAMIDE HCL 10 MG PO TABS: 5.0000 mg | ORAL_TABLET | Freq: Three times a day (TID) | ORAL | Status: DC | PRN

## 2020-08-09 MED ORDER — HYDROCODONE-ACETAMINOPHEN 5-325 MG PO TABS
1.0000 | ORAL_TABLET | ORAL | Status: DC | PRN
  Administered 2020-08-09 – 2020-08-11 (×3): 1 via ORAL
  Filled 2020-08-09 (×3): qty 1

## 2020-08-09 MED ORDER — DROPERIDOL 2.5 MG/ML IJ SOLN
0.6250 mg | Freq: Once | INTRAMUSCULAR | Status: DC | PRN
  Filled 2020-08-09: qty 2

## 2020-08-09 MED ORDER — LORATADINE 10 MG PO TABS
10.0000 mg | ORAL_TABLET | Freq: Every day | ORAL | Status: DC
  Administered 2020-08-10 – 2020-08-13 (×4): 10 mg via ORAL
  Filled 2020-08-09 (×4): qty 1

## 2020-08-09 MED ORDER — TAMSULOSIN HCL 0.4 MG PO CAPS
0.4000 mg | ORAL_CAPSULE | Freq: Every day | ORAL | Status: DC
  Administered 2020-08-10 – 2020-08-13 (×4): 0.4 mg via ORAL
  Filled 2020-08-09 (×4): qty 1

## 2020-08-09 MED ORDER — DEXAMETHASONE SODIUM PHOSPHATE 10 MG/ML IJ SOLN
INTRAMUSCULAR | Status: AC
  Filled 2020-08-09: qty 1

## 2020-08-09 MED ORDER — TIMOLOL MALEATE 0.5 % OP SOLG: 1.0000 [drp] | Freq: Every day | OPHTHALMIC | Status: DC

## 2020-08-09 MED ORDER — DOCUSATE SODIUM 100 MG PO CAPS
100.0000 mg | ORAL_CAPSULE | Freq: Two times a day (BID) | ORAL | Status: DC
  Administered 2020-08-09 – 2020-08-13 (×8): 100 mg via ORAL
  Filled 2020-08-09 (×8): qty 1

## 2020-08-09 MED ORDER — ONDANSETRON HCL 4 MG/2ML IJ SOLN
INTRAMUSCULAR | Status: DC | PRN
  Administered 2020-08-09: 4 mg via INTRAVENOUS

## 2020-08-09 MED ORDER — CEFAZOLIN SODIUM-DEXTROSE 2-4 GM/100ML-% IV SOLN
INTRAVENOUS | Status: AC
  Administered 2020-08-09: 2 g via INTRAVENOUS
  Filled 2020-08-09: qty 100

## 2020-08-09 MED ORDER — ZOLPIDEM TARTRATE 5 MG PO TABS: 5.0000 mg | ORAL_TABLET | Freq: Every evening | ORAL | Status: DC | PRN

## 2020-08-09 MED ORDER — FINASTERIDE 5 MG PO TABS
5.0000 mg | ORAL_TABLET | Freq: Every day | ORAL | Status: DC
  Administered 2020-08-10 – 2020-08-13 (×4): 5 mg via ORAL
  Filled 2020-08-09 (×4): qty 1

## 2020-08-09 MED ORDER — MENTHOL 3 MG MT LOZG
1.0000 | LOZENGE | OROMUCOSAL | Status: DC | PRN
  Filled 2020-08-09: qty 9

## 2020-08-09 MED ORDER — OXYCODONE HCL 5 MG PO TABS: 5.0000 mg | ORAL_TABLET | Freq: Once | ORAL | Status: DC | PRN

## 2020-08-09 MED ORDER — PROPOFOL 500 MG/50ML IV EMUL
INTRAVENOUS | Status: DC | PRN
  Administered 2020-08-09: 80 ug/kg/min via INTRAVENOUS

## 2020-08-09 MED ORDER — FLEET ENEMA 7-19 GM/118ML RE ENEM: 1.0000 | ENEMA | Freq: Once | RECTAL | Status: DC | PRN

## 2020-08-09 MED ORDER — FENTANYL CITRATE (PF) 100 MCG/2ML IJ SOLN: 25.0000 ug | INTRAMUSCULAR | Status: DC | PRN

## 2020-08-09 MED ORDER — TIMOLOL MALEATE 0.5 % OP SOLN
1.0000 [drp] | Freq: Every day | OPHTHALMIC | Status: DC
  Administered 2020-08-10 – 2020-08-12 (×3): 1 [drp] via OPHTHALMIC
  Filled 2020-08-09: qty 5

## 2020-08-09 MED ORDER — MIDAZOLAM HCL 2 MG/2ML IJ SOLN
INTRAMUSCULAR | Status: AC
  Filled 2020-08-09: qty 2

## 2020-08-09 MED ORDER — ALBUTEROL SULFATE (2.5 MG/3ML) 0.083% IN NEBU: 3.0000 mL | INHALATION_SOLUTION | Freq: Four times a day (QID) | RESPIRATORY_TRACT | Status: DC | PRN

## 2020-08-09 MED ORDER — MEPERIDINE HCL 25 MG/ML IJ SOLN: 6.2500 mg | INTRAMUSCULAR | Status: DC | PRN

## 2020-08-09 MED ORDER — PROMETHAZINE HCL 25 MG/ML IJ SOLN: 6.2500 mg | INTRAMUSCULAR | Status: DC | PRN

## 2020-08-09 MED ORDER — ONDANSETRON HCL 4 MG PO TABS: 4.0000 mg | ORAL_TABLET | Freq: Four times a day (QID) | ORAL | Status: DC | PRN

## 2020-08-09 MED ORDER — SODIUM CHLORIDE 0.9 % IV SOLN
INTRAVENOUS | Status: DC | PRN
  Administered 2020-08-09: 30 ug/min via INTRAVENOUS

## 2020-08-09 MED ORDER — PANTOPRAZOLE SODIUM 40 MG PO TBEC
80.0000 mg | DELAYED_RELEASE_TABLET | Freq: Every day | ORAL | Status: DC
  Administered 2020-08-10 – 2020-08-13 (×4): 80 mg via ORAL
  Filled 2020-08-09 (×4): qty 2

## 2020-08-09 MED ORDER — PHENOL 1.4 % MT LIQD
1.0000 | OROMUCOSAL | Status: DC | PRN
  Filled 2020-08-09: qty 177

## 2020-08-09 MED ORDER — CHLORHEXIDINE GLUCONATE 0.12 % MT SOLN: 15.0000 mL | Freq: Once | OROMUCOSAL | Status: AC

## 2020-08-09 MED ORDER — HYDROCHLOROTHIAZIDE 25 MG PO TABS
12.5000 mg | ORAL_TABLET | Freq: Every day | ORAL | Status: DC
  Administered 2020-08-10 – 2020-08-13 (×3): 12.5 mg via ORAL
  Filled 2020-08-09 (×4): qty 1

## 2020-08-09 MED ORDER — METOCLOPRAMIDE HCL 5 MG/ML IJ SOLN: 5.0000 mg | Freq: Three times a day (TID) | INTRAMUSCULAR | Status: DC | PRN

## 2020-08-09 MED ORDER — 0.9 % SODIUM CHLORIDE (POUR BTL) OPTIME
TOPICAL | Status: DC | PRN
  Administered 2020-08-09: 1000 mL
  Administered 2020-08-09: 500 mL

## 2020-08-09 MED ORDER — CEFAZOLIN SODIUM-DEXTROSE 2-4 GM/100ML-% IV SOLN
2.0000 g | INTRAVENOUS | Status: AC
  Administered 2020-08-09: 2 g via INTRAVENOUS

## 2020-08-09 MED ORDER — ONDANSETRON HCL 4 MG/2ML IJ SOLN
4.0000 mg | Freq: Four times a day (QID) | INTRAMUSCULAR | Status: DC | PRN
  Administered 2020-08-09: 4 mg via INTRAVENOUS
  Filled 2020-08-09: qty 2

## 2020-08-09 MED ORDER — MIDAZOLAM HCL 5 MG/5ML IJ SOLN
INTRAMUSCULAR | Status: DC | PRN
  Administered 2020-08-09: 2 mg via INTRAVENOUS

## 2020-08-09 MED ORDER — OXYCODONE HCL 5 MG/5ML PO SOLN
5.0000 mg | Freq: Once | ORAL | Status: DC | PRN
Start: 2020-08-09 — End: 2020-08-09

## 2020-08-09 MED ORDER — SODIUM CHLORIDE 0.9 % IJ SOLN: INTRAMUSCULAR | Status: DC | PRN

## 2020-08-09 MED ORDER — METHOCARBAMOL 500 MG PO TABS
500.0000 mg | ORAL_TABLET | Freq: Four times a day (QID) | ORAL | Status: DC | PRN
  Administered 2020-08-09 – 2020-08-11 (×4): 500 mg via ORAL
  Filled 2020-08-09 (×5): qty 1

## 2020-08-09 MED ORDER — HYDROCODONE-ACETAMINOPHEN 7.5-325 MG PO TABS
1.0000 | ORAL_TABLET | ORAL | Status: DC | PRN
  Administered 2020-08-10 (×2): 2 via ORAL
  Administered 2020-08-10 – 2020-08-11 (×2): 1 via ORAL
  Administered 2020-08-13: 2 via ORAL
  Filled 2020-08-09: qty 2
  Filled 2020-08-09: qty 1
  Filled 2020-08-09 (×3): qty 2
  Filled 2020-08-09: qty 1

## 2020-08-09 MED ORDER — LACTATED RINGERS IV SOLN: INTRAVENOUS | Status: DC

## 2020-08-09 MED ORDER — ENOXAPARIN SODIUM 40 MG/0.4ML IJ SOSY
40.0000 mg | PREFILLED_SYRINGE | INTRAMUSCULAR | Status: DC
  Administered 2020-08-10 – 2020-08-13 (×4): 40 mg via SUBCUTANEOUS
  Filled 2020-08-09 (×4): qty 0.4

## 2020-08-09 MED ORDER — VITAMIN D 25 MCG (1000 UNIT) PO TABS
2000.0000 [IU] | ORAL_TABLET | Freq: Every day | ORAL | Status: DC
  Administered 2020-08-10 – 2020-08-13 (×4): 2000 [IU] via ORAL
  Filled 2020-08-09 (×4): qty 2

## 2020-08-09 SURGICAL SUPPLY — 62 items
BLADE SAGITTAL AGGR TOOTH XLG (BLADE) ×2 IMPLANT
BNDG COHESIVE 6X5 TAN ST LF (GAUZE/BANDAGES/DRESSINGS) ×6 IMPLANT
CANISTER SUCT 1200ML W/VALVE (MISCELLANEOUS) ×2 IMPLANT
CANISTER WOUND CARE 500ML ATS (WOUND CARE) ×2 IMPLANT
CHLORAPREP W/TINT 26 (MISCELLANEOUS) ×2 IMPLANT
COVER BACK TABLE REUSABLE LG (DRAPES) ×2 IMPLANT
CUP ACETAB VERSA DBL 28X58 DMI (Orthopedic Implant) ×2 IMPLANT
DRAPE 3/4 80X56 (DRAPES) ×6 IMPLANT
DRAPE C-ARM XRAY 36X54 (DRAPES) ×2 IMPLANT
DRAPE INCISE IOBAN 66X60 STRL (DRAPES) IMPLANT
DRAPE POUCH INSTRU U-SHP 10X18 (DRAPES) ×2 IMPLANT
DRESSING SURGICEL FIBRLLR 1X2 (HEMOSTASIS) ×2 IMPLANT
DRSG MEPILEX SACRM 8.7X9.8 (GAUZE/BANDAGES/DRESSINGS) ×2 IMPLANT
DRSG OPSITE POSTOP 4X8 (GAUZE/BANDAGES/DRESSINGS) ×4 IMPLANT
DRSG SURGICEL FIBRILLAR 1X2 (HEMOSTASIS) ×4
ELECT BLADE 6.5 EXT (BLADE) ×2 IMPLANT
ELECT REM PT RETURN 9FT ADLT (ELECTROSURGICAL) ×2
ELECTRODE REM PT RTRN 9FT ADLT (ELECTROSURGICAL) ×1 IMPLANT
GAUZE 4X4 16PLY ~~LOC~~+RFID DBL (SPONGE) ×2 IMPLANT
GLOVE SURG SYN 9.0  PF PI (GLOVE) ×4
GLOVE SURG SYN 9.0 PF PI (GLOVE) ×2 IMPLANT
GLOVE SURG UNDER POLY LF SZ9 (GLOVE) ×2 IMPLANT
GOWN SRG 2XL LVL 4 RGLN SLV (GOWNS) ×1 IMPLANT
GOWN STRL NON-REIN 2XL LVL4 (GOWNS) ×2
GOWN STRL REUS W/ TWL LRG LVL3 (GOWN DISPOSABLE) ×1 IMPLANT
GOWN STRL REUS W/TWL LRG LVL3 (GOWN DISPOSABLE) ×2
HEMOVAC 400CC 10FR (MISCELLANEOUS) IMPLANT
HIP FEM HD L 28 (Head) ×2 IMPLANT
HOLDER FOLEY CATH W/STRAP (MISCELLANEOUS) ×2 IMPLANT
HOOD PEEL AWAY FLYTE STAYCOOL (MISCELLANEOUS) ×2 IMPLANT
IRRIGATION SURGIPHOR STRL (IV SOLUTION) IMPLANT
KIT PREVENA INCISION MGT 13 (CANNISTER) ×2 IMPLANT
MANIFOLD NEPTUNE II (INSTRUMENTS) ×2 IMPLANT
MAT ABSORB  FLUID 56X50 GRAY (MISCELLANEOUS) ×2
MAT ABSORB FLUID 56X50 GRAY (MISCELLANEOUS) ×1 IMPLANT
NDL SAFETY ECLIPSE 18X1.5 (NEEDLE) ×1 IMPLANT
NEEDLE HYPO 18GX1.5 SHARP (NEEDLE) ×2
NEEDLE SPNL 20GX3.5 QUINCKE YW (NEEDLE) ×4 IMPLANT
NS IRRIG 1000ML POUR BTL (IV SOLUTION) ×2 IMPLANT
PACK HIP COMPR (MISCELLANEOUS) ×2 IMPLANT
SCALPEL PROTECTED #10 DISP (BLADE) ×4 IMPLANT
SHELL ACETABULAR SZ0 58MM (Shell) ×2 IMPLANT
SOL PREP PVP 2OZ (MISCELLANEOUS) ×2
SOLUTION PREP PVP 2OZ (MISCELLANEOUS) ×1 IMPLANT
SPONGE DRAIN TRACH 4X4 STRL 2S (GAUZE/BANDAGES/DRESSINGS) ×2 IMPLANT
SPONGE T-LAP 18X18 ~~LOC~~+RFID (SPONGE) ×4 IMPLANT
STAPLER SKIN PROX 35W (STAPLE) ×2 IMPLANT
STEM FEMORAL 4 STD COLLARED (Stem) ×2 IMPLANT
STRAP SAFETY 5IN WIDE (MISCELLANEOUS) ×2 IMPLANT
SUT DVC 2 QUILL PDO  T11 36X36 (SUTURE) ×2
SUT DVC 2 QUILL PDO T11 36X36 (SUTURE) ×1 IMPLANT
SUT SILK 0 (SUTURE) ×2
SUT SILK 0 30XBRD TIE 6 (SUTURE) ×1 IMPLANT
SUT V-LOC 90 ABS DVC 3-0 CL (SUTURE) ×2 IMPLANT
SUT VIC AB 1 CT1 36 (SUTURE) ×2 IMPLANT
SYR 20ML LL LF (SYRINGE) ×2 IMPLANT
SYR 30ML LL (SYRINGE) ×2 IMPLANT
SYR 50ML LL SCALE MARK (SYRINGE) ×4 IMPLANT
SYR BULB IRRIG 60ML STRL (SYRINGE) ×2 IMPLANT
TAPE MICROFOAM 4IN (TAPE) ×2 IMPLANT
TOWEL OR 17X26 4PK STRL BLUE (TOWEL DISPOSABLE) ×2 IMPLANT
TRAY FOLEY MTR SLVR 16FR STAT (SET/KITS/TRAYS/PACK) ×2 IMPLANT

## 2020-08-09 NOTE — Transfer of Care (Signed)
Immediate Anesthesia Transfer of Care Note  Patient: Curtis Gregory  Procedure(s) Performed: TOTAL HIP ARTHROPLASTY ANTERIOR APPROACH (Right: Hip)  Patient Location: PACU  Anesthesia Type:Spinal  Level of Consciousness: drowsy  Airway & Oxygen Therapy: Patient Spontanous Breathing and Patient connected to face mask oxygen  Post-op Assessment: Report given to RN and Post -op Vital signs reviewed and stable  Post vital signs: Reviewed and stable  Last Vitals:  Vitals Value Taken Time  BP 125/77 08/09/20 1634  Temp    Pulse 45 08/09/20 1634  Resp 17 08/09/20 1634  SpO2 100 % 08/09/20 1634  Vitals shown include unvalidated device data.  Last Pain:  Vitals:   08/09/20 1634  TempSrc:   PainSc: 0-No pain      Patients Stated Pain Goal: 0 ([id number])  Complications: No notable events documented.

## 2020-08-09 NOTE — Progress Notes (Signed)
Pt transferred from PACU in stable condition.  Pt oriented to room and unit.  Pt instructed on call bell and placed by side.

## 2020-08-09 NOTE — H&P (Signed)
Chief Complaint  Patient presents with   Right Hip - Pain    History of the Present Illness: Curtis Gregory is a 70 y.o. male presents for evaluation of severe right hip arthritis. He had x-rays on 07/02/2020 with Cameron Proud, PA-C here in the office showing severe arthritis. He is interested in an anterior approach for total hip arthroplasty, and comes in to discuss this. An MRI reveals scarring. He also had abnormal appearance to the left hip, possibly related to a dysplasia or remote trauma, but no avascular necrosis. That was done after his prior visit because of a very abnormal shape to the femoral head. The lateral head was flattened. The patient states he had an injection that lasted about 2 days. 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  The patient states he has never had problems with his left hip.   Of note, he states he [place] in the house during the 7th month of pregnancy. He states he was not expected to survive and was put in a rocking chair for the first 6 months of his life.  The patient is a retired Set designer.  I have reviewed past medical, surgical, social and family history, and allergies as documented in the EMR.  Past Medical History: Past Medical History:  Diagnosis Date   Asthma   Benign prostatic hyperplasia with urinary frequency 06/16/2014   GERD (gastroesophageal reflux disease)   Glaucoma   Hyperlipemia   Hypertension   Internal and external thrombosed hemorrhoids 03/14/2009   Sleep apnea   Past Surgical History: Past Surgical History:  Procedure Laterality Date   COLONOSCOPY WITH PROPOFOL. POLYPECTOMY 09/11/2016  Surgeon: Lucilla Lame, MD   HEMORRHOIDECTOMY 03/30/2019  Surgeon: Ronny Bacon, MD   SPHINCTEROTOMY 08/05/2019  Surgeon: Ronny Bacon, MD   Past Family History: Family History  Problem Relation Age of  Onset   Diabetes Mother   Heart disease Mother   Alzheimer's disease Father   Prostate cancer Father   Prostate cancer Brother   Medications: Current Outpatient Medications Ordered in Epic  Medication Sig Dispense Refill   acetaminophen (TYLENOL) 650 MG ER tablet 1-2 tablets as needed for pain   fexofenadine (ALLEGRA) 180 MG tablet Take 1 tablet by mouth as needed   finasteride (PROSCAR) 5 mg tablet Take 5 mg by mouth once daily   hydroCHLOROthiazide (HYDRODIURIL) 12.5 MG tablet Take 1 tablet by mouth once daily   HYDROcodone-acetaminophen (NORCO) 5-325 mg tablet TAKE 1 TABLET BY MOUTH EVERY 6 (SIX) HOURS AS NEEDED FOR MODERATE PAIN.   ibuprofen (MOTRIN) 200 MG tablet Take by mouth Take 400 mg by mouth every 8 (eight) hours as needed (pain.).   latanoprost (XALATAN) 0.005 % ophthalmic solution Place 1 drop into both eyes nightly   omeprazole (PRILOSEC) 20 MG DR capsule Take 1 capsule by mouth once daily   rosuvastatin (CRESTOR) 20 MG tablet Take 20 mg by mouth nightly   sildenafil (REVATIO) 20 mg tablet TAKE TWO TO FIVE TABLETS BY MOUTH ONE HOUR PRIOR TO INTERCOURSE   tamsulosin (FLOMAX) 0.4 mg capsule Take by mouth Take 1 capsule (0.4 mg total) by mouth daily after breakfast.   timolol (TIMOPTIC-XE) 0.5 % ophthalmic gel-forming solution APPLY ONE DROP ONCE EVERY DAY INTO LEFT EYE ONLY   No current Epic-ordered facility-administered medications on file.   Allergies: No Known Allergies  Body mass index is 33.42 kg/m.  Review of Systems: A comprehensive 14 point ROS was performed, reviewed, and the pertinent orthopaedic findings are documented in the HPI.  Vitals:  07/13/20 0854  BP: 138/72    General Physical Examination:   General/Constitutional: No apparent distress: well-nourished and well developed. Eyes: Pupils equal, round with synchronous movement. Lungs: Clear to auscultation HEENT: Normal Vascular: No edema, swelling or tenderness, except as noted in detailed  exam. Cardiac: Heart rate and rhythm is regular. Integumentary: No impressive skin lesions present, except as noted in detailed exam. Neuro/Psych: Normal mood and affect, oriented to person, place and time.  Musculoskeletal Examination:   On exam, right hip has 10 degrees internal rotation and 10 degrees externa rotation. Antalgic gait.  Radiographs:  No new imaging studies were obtained today.  Assessment: ICD-10-CM  1. Primary osteoarthritis of right hip M16.11   Plan:  The patient has clinical findings of severe right hip osteoarthritis and suffers a great deal from this.  We discussed the patient's x-ray findings. I recommend right total hip arthroplasty. I explained the surgery and postoperative course in detail.  We will schedule the patient for right total hip arthroplasty this month.  Surgical Risks:  The nature of the condition and the proposed procedure has been reviewed in detail with the patient. Surgical versus non-surgical options and prognosis for recovery have been reviewed and the inherent risks and benefits of each have been discussed including the risks of infection, bleeding, injury to nerves/blood vessels/tendons, incomplete relief of symptoms, persisting pain and/or stiffness, loss of function, complex regional pain syndrome, failure of the procedure, as appropriate.  Attestation: Trilby Drummer, am documenting for [facility], MD utilizing [place].    Electronically signed by Lauris Poag, MD at 07/14/2020 5:07 PM EDT  Reviewed  H+P. No changes noted.

## 2020-08-09 NOTE — Anesthesia Preprocedure Evaluation (Addendum)
Anesthesia Evaluation  Patient identified by MRN, date of birth, ID band Patient awake    Reviewed: Allergy & Precautions, NPO status , Patient's Chart, lab work & pertinent test results  Airway Mallampati: II  TM Distance: >3 FB Neck ROM: Full    Dental no notable dental hx.    Pulmonary asthma , sleep apnea and Continuous Positive Airway Pressure Ventilation ,    Pulmonary exam normal breath sounds clear to auscultation       Cardiovascular Exercise Tolerance: Poor METS: 3 - Mets hypertension, Normal cardiovascular exam Rhythm:Regular Rate:Normal     Neuro/Psych negative neurological ROS  negative psych ROS   GI/Hepatic Neg liver ROS, GERD  Controlled and Medicated,  Endo/Other  negative endocrine ROS  Renal/GU negative Renal ROS   BPH    Musculoskeletal  (+) Arthritis , Osteoarthritis,    Abdominal (+) + obese,   Peds  Hematology negative hematology ROS (+)   Anesthesia Other Findings   Reproductive/Obstetrics                            Anesthesia Physical Anesthesia Plan  ASA: 2  Anesthesia Plan: Spinal   Post-op Pain Management:    Induction:   PONV Risk Score and Plan: 2 and Propofol infusion and Treatment may vary due to age or medical condition  Airway Management Planned: Natural Airway and Nasal Cannula  Additional Equipment:   Intra-op Plan:   Post-operative Plan:   Informed Consent: I have reviewed the patients History and Physical, chart, labs and discussed the procedure including the risks, benefits and alternatives for the proposed anesthesia with the patient or authorized representative who has indicated his/her understanding and acceptance.     Dental Advisory Given  Plan Discussed with: Anesthesiologist, CRNA and Surgeon  Anesthesia Plan Comments: (Patient reports no bleeding problems and no anticoagulant use.  Plan for spinal with backup  GA  Patient consented for risks of anesthesia including but not limited to:  - adverse reactions to medications - damage to eyes, teeth, lips or other oral mucosa - nerve damage due to positioning  - risk of bleeding, infection and or nerve damage from spinal that could lead to paralysis - risk of headache or failed spinal - damage to teeth, lips or other oral mucosa - sore throat or hoarseness - damage to heart, brain, nerves, lungs, other parts of body or loss of life  Patient voiced understanding.)        Anesthesia Quick Evaluation

## 2020-08-09 NOTE — Anesthesia Procedure Notes (Signed)
Spinal  Patient location during procedure: OR Start time: 08/09/2020 2:30 PM End time: 08/09/2020 2:37 PM Reason for block: surgical anesthesia Staffing Performed: resident/CRNA  Resident/CRNA: Esaw Grandchild, CRNA Preanesthetic Checklist Completed: patient identified, IV checked, site marked, risks and benefits discussed, surgical consent, monitors and equipment checked, pre-op evaluation and timeout performed Spinal Block Patient position: sitting Prep: ChloraPrep Patient monitoring: heart rate, continuous pulse ox and blood pressure Approach: midline Location: L2-3 Injection technique: single-shot Needle Needle type: Pencan  Needle gauge: 24 G Needle length: 10 cm Assessment Sensory level: T6 Events: CSF return

## 2020-08-09 NOTE — Op Note (Signed)
08/09/2020  4:31 PM  PATIENT:  Almeta Monas  70 y.o. male  PRE-OPERATIVE DIAGNOSIS:  Primary osteoarthritis of right hip M16.11  POST-OPERATIVE DIAGNOSIS:  Primary osteoarthritis of right hip M16.11  PROCEDURE:  Procedure(s): TOTAL HIP ARTHROPLASTY ANTERIOR APPROACH (Right)  SURGEON: Laurene Footman, MD  ASSISTANTS: None  ANESTHESIA:   spinal  EBL:  Total I/O In: 1100 [I.V.:1000; IV Piggyback:100] Out: 600 [Urine:450; Blood:150]  BLOOD ADMINISTERED:none  DRAINS:  Incisional wound VAC    LOCAL MEDICATIONS USED:  MARCAINE    and OTHER Exparel  SPECIMEN: Right femoral head  DISPOSITION OF SPECIMEN:  PATHOLOGY  COUNTS:  YES  TOURNIQUET:  * No tourniquets in log *  IMPLANTS: Medacta AMIS 4 standard stem with 58 mm mPACT DM cup and liner with L metal 28 mm head  DICTATION: .Dragon Dictation   The patient was brought to the operating room and after spinal anesthesia was obtained patient was placed on the operative table with the ipsilateral foot into the Medacta attachment, contralateral leg on a well-padded table. C-arm was brought in and preop template x-ray taken. After prepping and draping in usual sterile fashion appropriate patient identification and timeout procedures were completed. Anterior approach to the hip was obtained and centered over the greater trochanter and TFL muscle. The subcutaneous tissue was incised hemostasis being achieved by electrocautery. TFL fascia was incised and the muscle retracted laterally deep retractor placed. The lateral femoral circumflex vessels were identified and ligated. The anterior capsule was exposed and a capsulotomy performed. The neck was identified and a femoral neck cut carried out with a saw. The head was removed without difficulty and showed sclerotic femoral head and acetabulum. Reaming was carried out to 58 mm and a 58 mm cup trial gave appropriate tightness to the acetabular component a 58 DM cup was impacted into position. The  leg was then externally rotated and ischiofemoral and pubofemoral releases carried out. The femur was sequentially broached to a size 4, size 4 standard with S and L heads trials were placed and the final components chosen. The 4 standard stem was inserted along with a metal L 28 mm head and 58 mm liner. The hip was reduced and was stable the wound was thoroughly irrigated with fibrillar placed along the posterior capsule and medial neck. The deep fascia ws closed using a heavy Quill after infiltration of 30 cc of quarter percent Sensorcaine with epinephrine. diluted with Exparel, 3-0 V-loc to close the skin with skin staples.  Incisional wound VAC applied and patient was sent to recovery in stable condition.   PLAN OF CARE: Admit to inpatient

## 2020-08-10 ENCOUNTER — Other Ambulatory Visit: Payer: Self-pay | Admitting: *Deleted

## 2020-08-10 ENCOUNTER — Encounter: Payer: Self-pay | Admitting: Orthopedic Surgery

## 2020-08-10 DIAGNOSIS — R350 Frequency of micturition: Secondary | ICD-10-CM

## 2020-08-10 DIAGNOSIS — N401 Enlarged prostate with lower urinary tract symptoms: Secondary | ICD-10-CM

## 2020-08-10 DIAGNOSIS — N5201 Erectile dysfunction due to arterial insufficiency: Secondary | ICD-10-CM

## 2020-08-10 LAB — CBC
HCT: 37.2 % — ABNORMAL LOW (ref 39.0–52.0)
Hemoglobin: 13.1 g/dL (ref 13.0–17.0)
MCH: 31.6 pg (ref 26.0–34.0)
MCHC: 35.2 g/dL (ref 30.0–36.0)
MCV: 89.6 fL (ref 80.0–100.0)
Platelets: 267 10*3/uL (ref 150–400)
RBC: 4.15 MIL/uL — ABNORMAL LOW (ref 4.22–5.81)
RDW: 12.2 % (ref 11.5–15.5)
WBC: 9.9 10*3/uL (ref 4.0–10.5)
nRBC: 0 % (ref 0.0–0.2)

## 2020-08-10 LAB — BASIC METABOLIC PANEL
Anion gap: 4 — ABNORMAL LOW (ref 5–15)
BUN: 17 mg/dL (ref 8–23)
CO2: 26 mmol/L (ref 22–32)
Calcium: 7.9 mg/dL — ABNORMAL LOW (ref 8.9–10.3)
Chloride: 105 mmol/L (ref 98–111)
Creatinine, Ser: 1.01 mg/dL (ref 0.61–1.24)
GFR, Estimated: >60 mL/min (ref >60)
Glucose, Bld: 146 mg/dL — ABNORMAL HIGH (ref 70–99)
Potassium: 4 mmol/L (ref 3.5–5.1)
Sodium: 135 mmol/L (ref 135–145)

## 2020-08-10 MED ORDER — ENOXAPARIN SODIUM 40 MG/0.4ML IJ SOSY: 40.0000 mg | PREFILLED_SYRINGE | INTRAMUSCULAR | 0 refills | Status: DC

## 2020-08-10 MED ORDER — DOCUSATE SODIUM 100 MG PO CAPS: 100.0000 mg | ORAL_CAPSULE | Freq: Two times a day (BID) | ORAL | 0 refills | Status: DC

## 2020-08-10 MED ORDER — TRAMADOL HCL 50 MG PO TABS: 50.0000 mg | ORAL_TABLET | Freq: Four times a day (QID) | ORAL | 0 refills | Status: DC | PRN

## 2020-08-10 MED ORDER — SILDENAFIL CITRATE 20 MG PO TABS: ORAL_TABLET | ORAL | 1 refills | Status: DC

## 2020-08-10 MED ORDER — HYDROCODONE-ACETAMINOPHEN 5-325 MG PO TABS: 1.0000 | ORAL_TABLET | ORAL | 0 refills | Status: DC | PRN

## 2020-08-10 MED ORDER — METHOCARBAMOL 500 MG PO TABS
500.0000 mg | ORAL_TABLET | Freq: Four times a day (QID) | ORAL | 0 refills | Status: DC | PRN
Start: 2020-08-10 — End: 2020-10-19

## 2020-08-10 NOTE — Discharge Summary (Signed)
Physician Discharge Summary  Patient ID: Curtis Gregory MRN: [id number] DOB/AGE: [birth year] 70 y.o.  Admit date: 08/09/2020 Discharge date: 08/13/2020  Admission Diagnoses:  Status post total hip replacement, right [Z96.641]   Discharge Diagnoses: Patient Active Problem List   Diagnosis Date Noted   Status post total hip replacement, right 08/09/2020   Family history of prostate cancer 03/16/2020   Erectile dysfunction due to arterial insufficiency 03/16/2020   Anal fissure 07/26/2019   Rectal or anal pain 06/23/2019   Status post hemorrhoidectomy 05/17/2019   Chronic pain of right knee 09/08/2018   Elevated hemoglobin A1c 09/09/2017   Vitamin D deficiency 09/09/2017   Benign neoplasm of descending colon    Seasonal allergies 04/30/2015   Snoring 10/18/2014   Torticollis, acute 10/18/2014   Asthma, mild intermittent 06/16/2014   Hyperlipidemia 06/16/2014   Essential hypertension 06/16/2014   GERD (gastroesophageal reflux disease) 06/16/2014   Pain in the wrist 06/16/2014   Apnea, sleep 06/16/2014   Benign prostatic hyperplasia with urinary frequency 06/16/2014    Past Medical History:  Diagnosis Date   Asthma    Benign prostatic hypertrophy with urinary frequency 06/16/2014   Excessive urination at night 06/16/2014   GERD (gastroesophageal reflux disease)    Glaucoma    Hyperlipemia    Hypertension    Internal and external thrombosed hemorrhoids 03/15/2019   Sleep apnea      Transfusion: none   Consultants (if any):   Discharged Condition: Improved  Hospital Course: Dominyck Califf is an 70 y.o. male who was admitted 08/09/2020 with a diagnosis of right hip ostearthritis and went to the operating room on 08/09/2020 and underwent the above named procedures.    Surgeries: Procedure(s): TOTAL HIP ARTHROPLASTY ANTERIOR APPROACH on 08/09/2020 Patient tolerated the surgery well. Taken to PACU where she was stabilized and then transferred to the orthopedic floor.  Started on  Lovenox 40 mg q 24 hrs. Foot pumps applied bilaterally at 80 mm. Heels elevated on bed with rolled towels. No evidence of DVT. Negative Homan. Physical therapy started on day #1 for gait training and transfer. OT started day #1 for ADL and assisted devices.  Patient's foley was d/c on day #1. Patient's IV was d/c on day #2.  Patient may continue to progress of physical therapy throughout his stay.  By postop day IV.  4 patient was doing well with physical therapy, stable and ready for discharge to home with home health PT.  On post op day #4 patient was stable and ready for discharge to home with home health PT.    He was given perioperative antibiotics:  Anti-infectives (From admission, onward)    Start     Dose/Rate Route Frequency Ordered Stop   08/09/20 2100  ceFAZolin (ANCEF) IVPB 2g/100 mL premix        2 g 200 mL/hr over 30 Minutes Intravenous Every 6 hours 08/09/20 1820 08/10/20 0330   08/09/20 1011  ceFAZolin (ANCEF) 2-4 GM/100ML-% IVPB       Note to Pharmacy: [facility], [facility]: cabinet override      08/09/20 1011 08/09/20 2208   08/09/20 0930  ceFAZolin (ANCEF) IVPB 2g/100 mL premix        2 g 200 mL/hr over 30 Minutes Intravenous On call to O.R. 08/09/20 [id number] 08/09/20 1455     .  He was given sequential compression devices, early ambulation, and Lovenox, teds for DVT prophylaxis.  He benefited maximally from the hospital stay and there were no complications.  Recent vital signs:  Vitals:   08/13/20 0512 08/13/20 0721  BP: 117/64 121/75  Pulse: 67 70  Resp: 19 16  Temp: 98.9 F (37.2 C) 98.6 F (37 C)  SpO2: 100% 96%    Recent laboratory studies:  Lab Results  Component Value Date   HGB 12.6 (L) 08/11/2020   HGB 13.1 08/10/2020   HGB 14.7 08/09/2020   Lab Results  Component Value Date   WBC 9.3 08/11/2020   PLT 243 08/11/2020   No results found for: INR Lab Results  Component Value Date   NA 135 08/10/2020   K 4.0 08/10/2020   CL 105  08/10/2020   CO2 26 08/10/2020   BUN 17 08/10/2020   CREATININE 1.01 08/10/2020   GLUCOSE 146 (H) 08/10/2020    Discharge Medications:   Allergies as of 08/13/2020   No Known Allergies      Medication List     STOP taking these medications    ibuprofen 200 MG tablet Commonly known as: ADVIL       TAKE these medications    albuterol 108 (90 Base) MCG/ACT inhaler Commonly known as: VENTOLIN HFA Inhale 1-2 puffs into the lungs every 6 (six) hours as needed for wheezing or shortness of breath. Reported on 06/04/2015   docusate sodium 100 MG capsule Commonly known as: COLACE Take 1 capsule (100 mg total) by mouth 2 (two) times daily.   enoxaparin 40 MG/0.4ML injection Commonly known as: LOVENOX Inject 0.4 mLs (40 mg total) into the skin daily for 14 days.   finasteride 5 MG tablet Commonly known as: PROSCAR Take 1 tablet (5 mg total) by mouth daily.   HYDROcodone-acetaminophen 5-325 MG tablet Commonly known as: NORCO/VICODIN Take 1-2 tablets by mouth every 4 (four) hours as needed for moderate pain (pain score 4-6).   hydrocortisone 25 MG suppository Commonly known as: ANUSOL-HC Place 1 suppository (25 mg total) rectally 2 (two) times daily. For 7 days   latanoprost 0.005 % ophthalmic solution Commonly known as: XALATAN Place 1 drop into both eyes at bedtime.   methocarbamol 500 MG tablet Commonly known as: ROBAXIN Take 1 tablet (500 mg total) by mouth every 6 (six) hours as needed for muscle spasms.   sildenafil 20 MG tablet Commonly known as: REVATIO TAKE TWO TO FIVE TABLETS BY MOUTH ONE HOUR PRIOR TO INTERCOURSE What changed:  how much to take how to take this when to take this reasons to take this additional instructions   tamsulosin 0.4 MG Caps capsule Commonly known as: FLOMAX Take 1 capsule (0.4 mg total) by mouth daily after breakfast.   timolol 0.5 % ophthalmic gel-forming Commonly known as: TIMOPTIC-XR Place 1 drop into the left eye at  bedtime.   traMADol 50 MG tablet Commonly known as: ULTRAM Take 1 tablet (50 mg total) by mouth every 6 (six) hours as needed.   Vitamin D3 50 MCG (2000 UT) capsule Take 2,000 Units by mouth daily.       ASK your doctor about these medications    fexofenadine 180 MG tablet Commonly known as: ALLEGRA TAKE 1 TABLET BY MOUTH EVERY DAY   fluticasone 50 MCG/ACT nasal spray Commonly known as: FLONASE Place 2 sprays into both nostrils daily. Use for 4-6 weeks then stop and use seasonally or as needed.   hydrochlorothiazide 12.5 MG tablet Commonly known as: HYDRODIURIL TAKE 1 TABLET BY MOUTH EVERY DAY   omeprazole 20 MG capsule Commonly known as: PRILOSEC TAKE 1 CAPSULE BY MOUTH EVERY  DAY   rosuvastatin 20 MG tablet Commonly known as: CRESTOR TAKE 1 TABLET BY MOUTH EVERYDAY AT BEDTIME               Durable Medical Equipment  (From admission, onward)           Start     Ordered   08/09/20 1821  DME Walker rolling  Once       Question Answer Comment  Walker: With 5 Inch Wheels   Patient needs a walker to treat with the following condition Status post total hip replacement, right      08/09/20 1820   08/09/20 1821  DME 3 n 1  Once        08/09/20 1820   08/09/20 1821  DME Bedside commode  Once       Question:  Patient needs a bedside commode to treat with the following condition  Answer:  Status post total hip replacement, right   08/09/20 1820            Diagnostic Studies: DG HIP OPERATIVE UNILAT W OR W/O PELVIS RIGHT  Result Date: 08/09/2020 CLINICAL DATA:  70 year old male with right hip arthroplasty. EXAM: OPERATIVE right HIP (WITH PELVIS IF PERFORMED) 1 VIEWS TECHNIQUE: Fluoroscopic spot image(s) were submitted for interpretation post-operatively. COMPARISON:  None. FINDINGS: Single intraoperative fluoroscopic spot images of the right hip provided. The total right hip arthroplasty. The total fluoroscopic time is 18 seconds with a total air kerma of 7.0  mGy. IMPRESSION: Right hip arthroplasty. Electronically Signed   By: Anner Crete M.D.   On: 08/09/2020 17:18   DG HIP UNILAT W OR W/O PELVIS 2-3 VIEWS RIGHT  Result Date: 08/09/2020 CLINICAL DATA:  Status post right hip replacement. EXAM: DG HIP (WITH OR WITHOUT PELVIS) 2-3V RIGHT COMPARISON:  None. FINDINGS: The right acetabular and femoral components are well situated. Expected postoperative changes are noted in the surrounding soft tissues. IMPRESSION: Status post right total hip arthroplasty. Electronically Signed   By: Marijo Conception M.D.   On: 08/09/2020 17:27    Disposition:      Follow-up Information     Duanne Guess, PA-C Follow up in 2 week(s).   Specialties: Orthopedic Surgery, Emergency Medicine Contact information: [place] [address] [phone]                  Signed: Feliberto Gottron 08/13/2020, 7:56 AM

## 2020-08-10 NOTE — Anesthesia Postprocedure Evaluation (Signed)
Anesthesia Post Note  Patient: [name]  Procedure(s) Performed: TOTAL HIP ARTHROPLASTY ANTERIOR APPROACH (Right: Hip)  Patient location during evaluation: Nursing Unit Anesthesia Type: Spinal Level of consciousness: oriented and awake and alert Pain management: pain level controlled Vital Signs Assessment: post-procedure vital signs reviewed and stable Respiratory status: spontaneous breathing and respiratory function stable Cardiovascular status: blood pressure returned to baseline and stable Postop Assessment: no headache, no backache and no apparent nausea or vomiting Anesthetic complications: no   No notable events documented.   Last Vitals:  Vitals:   08/10/20 0113 08/10/20 0437  BP: (!) 146/83 109/88  Pulse: (!) 56 (!) 59  Resp: 17 16  Temp: 36.9 C 37.1 C  SpO2: 100% 99%    Last Pain:  Vitals:   08/10/20 0113  TempSrc:   PainSc: 5                  Hoyte Ziebell

## 2020-08-10 NOTE — Progress Notes (Signed)
   Subjective: 1 Day Post-Op Procedure(s) (LRB): TOTAL HIP ARTHROPLASTY ANTERIOR APPROACH (Right) Patient reports pain as mild.   Patient is well, and has had no acute complaints or problems Denies any CP, SOB, ABD pain. We will continue therapy today.  Plan is to go Home after hospital stay.  Objective: Vital signs in last 24 hours: Temp:  [97.3 F (36.3 C)-98.7 F (37.1 C)] 98.4 F (36.9 C) (07/29 0754) Pulse Rate:  [50-64] 57 (07/29 0754) Resp:  [11-20] 16 (07/29 0754) BP: (109-152)/(71-88) 129/73 (07/29 0754) SpO2:  [99 %-100 %] 100 % (07/29 0754) Weight:  [99.8 kg] 99.8 kg (07/28 1019)  Intake/Output from previous day: 07/28 0701 - 07/29 0700 In: 2365.2 [I.V.:2065.2; IV Piggyback:300] Out: 1900 [Urine:1750; Blood:150] Intake/Output this shift: No intake/output data recorded.  Recent Labs    08/09/20 1929 08/10/20 0606  HGB 14.7 13.1   Recent Labs    08/09/20 1929 08/10/20 0606  WBC 10.3 9.9  RBC 4.57 4.15*  HCT 43.6 37.2*  PLT 246 267   Recent Labs    08/09/20 1929 08/10/20 0606  NA  --  135  K  --  4.0  CL  --  105  CO2  --  26  BUN  --  17  CREATININE 0.92 1.01  GLUCOSE  --  146*  CALCIUM  --  7.9*   No results for input(s): LABPT, INR in the last 72 hours.  EXAM General - Patient is Alert, Appropriate, and Oriented Extremity - Neurovascular intact Sensation intact distally Intact pulses distally Dorsiflexion/Plantar flexion intact Incision: dressing C/D/I and no drainage No cellulitis present Compartment soft Dressing - no drainage and Praveena system Motor Function - intact, moving foot and toes well on exam.   Past Medical History:  Diagnosis Date   Asthma    Benign prostatic hypertrophy with urinary frequency 06/16/2014   Excessive urination at night 06/16/2014   GERD (gastroesophageal reflux disease)    Glaucoma    Hyperlipemia    Hypertension    Internal and external thrombosed hemorrhoids 03/15/2019   Sleep apnea      Assessment/Plan:   1 Day Post-Op Procedure(s) (LRB): TOTAL HIP ARTHROPLASTY ANTERIOR APPROACH (Right) Active Problems:   Status post total hip replacement, right  Estimated body mass index is 32.96 kg/m as calculated from the following:   Height as of this encounter: 5' 8.5" (1.74 m).   Weight as of this encounter: 99.8 kg. Advance diet Up with therapy Vital signs stable Pain well controlled Labs are stable Care management to assist with discharge to home with home health PT pending progress and completion of PT goals  DVT Prophylaxis - Lovenox, TED hose, and SCDs Weight-Bearing as tolerated to right leg   T. Rachelle Hora, PA-C [place] 08/10/2020, 8:08 AM

## 2020-08-10 NOTE — Progress Notes (Signed)
Physical Therapy Treatment Patient Details Name: Curtis Gregory MRN: [id number] DOB: [birth year] Today's Date: 08/10/2020    History of Present Illness Pt is s/p R THA on 08/09/20.    PT Comments    Pt seated EOB upon arrival to the room following OT treatment.  Pt agreed to participate in PT and to attempt standing/walking.  Pt was able to standing with modA from therapist and heavy use of the UE's.  Pt had one episode of instability due to taking hand off walker, but was able to prevent sudden lowering with the use of his L LE and R UE on the RW.  Pt then performed marching and side steps towards the Curtis Gregory, increased difficulty with this task, noting a minimal raise of the R LE off the floor due to pain/weakness.  Pt then transferred back to bed with modA for R LE navigation into the bed.  Pt left in room with all needs met and family/friends in room.  Current discharge plans to HHPT remain appropriate at this time, barring the patient is able to ambulate household distances.  Pt will continue to benefit from skilled therapy in order to address deficits listed below.     Follow Up Recommendations  Home health PT;Supervision/Assistance - 24 hour     Equipment Recommendations  Rolling walker with 5" wheels;3in1 (PT)    Recommendations for Other Services       Precautions / Restrictions Precautions Precautions: Anterior Hip Precaution Booklet Issued: Yes (comment) Restrictions Weight Bearing Restrictions: Yes RLE Weight Bearing: Weight bearing as tolerated    Mobility  Bed Mobility               General bed mobility comments: pt sitting upright following OT session at EOB.    Transfers Overall transfer level: Needs assistance Equipment used: Rolling walker (2 wheeled) Transfers: Sit to/from Stand Sit to Stand: Min assist         General transfer comment: minA necessary for coming upright into standing with RW.  Ambulation/Gait Ambulation/Gait assistance: Min  assist Gait Distance (Feet): 3 Feet Assistive device: Rolling walker (2 wheeled) Gait Pattern/deviations: Step-to pattern Gait velocity: decreased   General Gait Details: Pt unable to lift foot off ground consistently in order to side-step towards the Curtis Gregory Gregory, but did make small movements in order to return to the bed.   Stairs             Wheelchair Mobility    Modified Rankin (Stroke Patients Only)       Balance Overall balance assessment: Needs assistance Sitting-balance support: No upper extremity supported Sitting balance-Leahy Scale: Good     Standing balance support: Bilateral upper extremity supported Standing balance-Leahy Scale: Fair Standing balance comment: Pt requires heavy use of UE's on the RW to remain upright.  Pt attempted to take hand off walker and had incident of being off balance.                            Cognition Arousal/Alertness: Awake/alert Behavior During Therapy: WFL for tasks assessed/performed Overall Cognitive Status: Within Functional Limits for tasks assessed                                        Exercises Total Joint Exercises Ankle Circles/Pumps: AROM;Strengthening;Both;10 reps;Supine Quad Sets: AROM;Strengthening;Both;10 reps;Supine Gluteal Sets: AROM;Strengthening;Both;10 reps;Supine Hip ABduction/ADduction: AROM;Left;10 reps;AAROM;Right;5 reps;Strengthening;Supine Straight  Leg Raises: AROM;Left;10 reps;AAROM;Right;5 reps;Strengthening;Supine Other Exercises Other Exercises: Pt and wife educated on role of therapist and the services provided during hospital stay.    General Comments General comments (skin integrity, edema, etc.): not assessed due to pain wiht bed-level exercises.      Pertinent Vitals/Pain Pain Assessment: Faces Faces Pain Scale: Hurts whole lot Pain Location: R Incision Site Pain Descriptors / Indicators: Aching Pain Intervention(s): Limited activity within patient's  tolerance;Monitored during session;Premedicated before session;Repositioned    Home Living                      Prior Function            PT Goals (current goals can now be found in the care plan section) Acute Rehab PT Goals Patient Stated Goal: decrease pain PT Goal Formulation: With patient/family Time For Goal Achievement: 08/24/20 Potential to Achieve Goals: Good Progress towards PT goals: Progressing toward goals    Frequency    BID      PT Plan Current plan remains appropriate    Co-evaluation              AM-PAC PT "6 Clicks" Mobility   Outcome Measure  Help needed turning from your back to your side while in a flat bed without using bedrails?: A Lot Help needed moving from lying on your back to sitting on the side of a flat bed without using bedrails?: A Lot Help needed moving to and from a bed to a chair (including a wheelchair)?: A Lot Help needed standing up from a chair using your arms (e.g., wheelchair or bedside chair)?: A Lot Help needed to walk in hospital room?: Total Help needed climbing 3-5 steps with a railing? : Total 6 Click Score: 10    End of Session Equipment Utilized During Treatment: Gait belt Activity Tolerance: Patient limited by pain;Patient tolerated treatment well Patient left: in bed;with call bell/phone within reach;with bed alarm set;with family/visitor present Nurse Communication: Mobility status;Patient requests pain meds PT Visit Diagnosis: Unsteadiness on feet (R26.81);Other abnormalities of gait and mobility (R26.89);Muscle weakness (generalized) (M62.81);Difficulty in walking, not elsewhere classified (R26.2);Pain Pain - Right/Left: Right Pain - part of body: Hip     Time: 5974-1638 PT Time Calculation (min) (ACUTE ONLY): 40 min  Charges:  $Therapeutic Exercise: 38-52 mins                     Curtis Gregory, PT, DPT 08/10/20, 4:54 PM    Curtis Gregory 08/10/2020, 4:43 PM

## 2020-08-10 NOTE — Discharge Instructions (Signed)

## 2020-08-10 NOTE — Progress Notes (Signed)
Met with the patient to discuss DC plan an needs His wife was at the bedside He needs a RW and a 3 in 1, Rhonda with [employer] will bring into the room prior to DC He is set up with [facility] for [facility] PT He has transportation  He can afford his medications

## 2020-08-10 NOTE — Evaluation (Signed)
Occupational Therapy Evaluation Patient Details Name: Curtis Gregory MRN: [id number] DOB: 07/27/1950 Today's Date: 08/10/2020    History of Present Illness Pt is s/p R THA on 08/09/20.   Clinical Impression   Pt seen for OT evaluation this date in setting of acute hospitalization s/p R THA. Pt with c/o post-op R hip pain this date in addition to some baseline weakness. Pt requires increased time and encouragement for OOB activity. Requires MOD A to come to EOB sitting as well as cues for sequence throughout to reduce pain. Pt demos P static sitting balance initially, but with increased time and assist to square his hips, he is able to maintain G static sitting balance. Pt with limited hip flexion tolerance and generally unable to complete LB dressing without at least MAX A. Introduced topic of AE for LB ADLs. At this time, PT presented to complete session. Will continue to follow. Anticipate pt will require HHOT f/u to improve INDEP and safety with ADLs and offer further adaptation/modification education in the natural environment.    Follow Up Recommendations  Home health OT;Supervision - Intermittent    Equipment Recommendations  3 in 1 bedside commode;Tub/shower seat;Other (comment) (2ww, adaptive equipment for LB ADLs including hit kit: sock aide, reacher, [place] shoe horn, and LH sponge)    Recommendations for Other Services       Precautions / Restrictions Precautions Precautions: Anterior Hip Precaution Booklet Issued: Yes (comment) Restrictions Weight Bearing Restrictions: Yes RLE Weight Bearing: Weight bearing as tolerated      Mobility Bed Mobility Overal bed mobility: Needs Assistance Bed Mobility: Supine to Sit     Supine to sit: Mod assist     General bed mobility comments: MOD A, increased time, cues for sequence for pain reduction incluidng use of bed rails and using non-op LE to propel    Transfers Overall transfer level: Needs assistance Equipment used: Rolling  walker (2 wheeled) Transfers: Sit to/from Stand Sit to Stand: Min assist         General transfer comment: deferred    Balance Overall balance assessment: Needs assistance Sitting-balance support: No upper extremity supported Sitting balance-Leahy Scale: Good     Standing balance support: Bilateral upper extremity supported Standing balance-Leahy Scale: Fair Standing balance comment: deferred on OT eval                           ADL either performed or assessed with clinical judgement   ADL Overall ADL's : Needs assistance/impaired                                       General ADL Comments: SETUP for seated UB ADLs, reqiures MAX A for LB ADLs in sitting, MOD A for bed mobility.     Vision Patient Visual Report: No change from baseline       Perception     Praxis      Pertinent Vitals/Pain Pain Assessment: Faces Faces Pain Scale: Hurts whole lot Pain Location: R Incision Site Pain Descriptors / Indicators: Aching Pain Intervention(s): Limited activity within patient's tolerance;Monitored during session;Premedicated before session;Repositioned     Hand Dominance Right   Extremity/Trunk Assessment Upper Extremity Assessment Upper Extremity Assessment: Generalized weakness (ROM WFL, MMT grossly 4-/5)   Lower Extremity Assessment Lower Extremity Assessment: Generalized weakness;Defer to PT evaluation;RLE deficits/detail RLE Deficits / Details: expected post-op limitations d/t pain  including limited with hip flexion to perform LB ADLs. RLE: Unable to fully assess due to pain       Communication Communication Communication: No difficulties   Cognition Arousal/Alertness: Awake/alert Behavior During Therapy: WFL for tasks assessed/performed Overall Cognitive Status: Within Functional Limits for tasks assessed                                     General Comments  not assessed due to pain wiht bed-level exercises.     Exercises Total Joint Exercises Ankle Circles/Pumps: AROM;Strengthening;Both;10 reps;Supine Quad Sets: AROM;Strengthening;Both;10 reps;Supine Gluteal Sets: AROM;Strengthening;Both;10 reps;Supine Hip ABduction/ADduction: AROM;Left;10 reps;AAROM;Right;5 reps;Strengthening;Supine Straight Leg Raises: AROM;Left;10 reps;AAROM;Right;5 reps;Strengthening;Supine Other Exercises Other Exercises: Ed with pt and spouse re: role of OT in acute setting, potential use of AE for LB ADLs. Pt and spouse with good understanding.   Shoulder Instructions      Home Living Family/patient expects to be discharged to:: Private residence Living Arrangements: Spouse/significant other Available Help at Discharge: Family;Available 24 hours/day Type of Home: House Home Access: Stairs to enter [employer] of Steps: 1 Entrance Stairs-Rails: Right Home Layout: One level     Bathroom Shower/Tub: Occupational psychologist: Standard     Home Equipment: Hand held shower head;Cane - single point;Tub bench          Prior Functioning/Environment Level of Independence: Independent with assistive device(s)                 OT Problem List: Decreased strength;Decreased activity tolerance;Decreased knowledge of use of DME or AE;Pain      OT Treatment/Interventions: Self-care/ADL training;Therapeutic activities;Therapeutic exercise;Patient/family education;DME and/or AE instruction    OT Goals(Current goals can be found in the care plan section) Acute Rehab OT Goals Patient Stated Goal: decrease pain OT Goal Formulation: With patient Time For Goal Achievement: 08/24/20 Potential to Achieve Goals: Good ADL Goals Pt Will Perform Lower Body Dressing: with min assist;sit to/from stand;with adaptive equipment Pt Will Transfer to Toilet: with min guard assist;bedside commode Pt Will Perform Toileting - Clothing Manipulation and hygiene: with min guard assist;with min assist  OT Frequency:  Min 1X/week   Barriers to D/C:            Co-evaluation              AM-PAC OT "6 Clicks" Daily Activity     Outcome Measure Help from another person eating meals?: None Help from another person taking care of personal grooming?: A Little Help from another person toileting, which includes using toliet, bedpan, or urinal?: A Lot Help from another person bathing (including washing, rinsing, drying)?: A Lot Help from another person to put on and taking off regular upper body clothing?: A Little Help from another person to put on and taking off regular lower body clothing?: A Lot 6 Click Score: 16   End of Session Equipment Utilized During Treatment: Gait belt;Rolling walker Nurse Communication: Mobility status  Activity Tolerance: Patient tolerated treatment well Patient left: Other (comment) (seated EOB starting PT session)  OT Visit Diagnosis: Unsteadiness on feet (R26.81);Muscle weakness (generalized) (M62.81);Pain Pain - Right/Left: Right Pain - part of body: Hip                Time: 9326-7124 OT Time Calculation (min): 25 min Charges:  OT General Charges $OT Visit: 1 Visit OT Evaluation $OT Eval Moderate Complexity: 1 Mod OT Treatments $Self Care/Home Management :  8-22 mins  Gerrianne Scale, [place], OTR/L ascom [phone] 08/10/20, 5:15 PM

## 2020-08-10 NOTE — Evaluation (Signed)
Physical Therapy Evaluation Patient Details Name: Curtis Gregory MRN: [id number] DOB: 02-20-1950 Today's Date: 08/10/2020   History of Present Illness  Pt is s/p R THA on 08/09/20.  Clinical Impression  Pt received in Semi-Fowler's position and agreeable to therapy.  Pt is A&O x4 and is able to communicate well.  Pt's wife in room and confirmed all living situation needs.  Pt states he has 6/10 pain upon arrival and after performing bed-level exercises, he notes 10/10 pain.  Nursing notified and would be getting pain meds soon.  Pt elected to not attempt standing or bed mobility until pain levels decrease.  Anticipate d/c home as long as pain is well-maintained and he is able to functionally ambulate household distances.  Pt will benefit from skilled PT intervention to increase independence and safety with basic mobility in preparation for discharge to the venue listed below.       Follow Up Recommendations Home health PT;Supervision/Assistance - 24 hour    Equipment Recommendations  Rolling walker with 5" wheels;3in1 (PT)    Recommendations for Other Services       Precautions / Restrictions        Mobility  Bed Mobility               General bed mobility comments: deferred d/t 10/10 pain.    Transfers                    Ambulation/Gait                Stairs            Wheelchair Mobility    Modified Rankin (Stroke Patients Only)       Balance                                             Pertinent Vitals/Pain Pain Assessment: 0-10 Pain Score: 8  Pain Location: R Incision Site Pain Descriptors / Indicators: Aching Pain Intervention(s): Limited activity within patient's tolerance;Monitored during session;Premedicated before session;Repositioned;Ice applied    Home Living Family/patient expects to be discharged to:: Private residence Living Arrangements: Spouse/significant other Available Help at Discharge:  Family;Available 24 hours/day Type of Home: House Home Access: Stairs to enter Entrance Stairs-Rails: Right Entrance Stairs-Number of Steps: 1 Home Layout: One level Home Equipment: Hand held shower head;Cane - single point;Tub bench      Prior Function Level of Independence: Independent with assistive device(s)               Hand Dominance   Dominant Hand: Right    Extremity/Trunk Assessment   Upper Extremity Assessment Upper Extremity Assessment: Generalized weakness    Lower Extremity Assessment Lower Extremity Assessment: Generalized weakness;RLE deficits/detail RLE: Unable to fully assess due to pain       Communication   Communication: No difficulties  Cognition Arousal/Alertness: Awake/alert Behavior During Therapy: WFL for tasks assessed/performed Overall Cognitive Status: Within Functional Limits for tasks assessed                                        General Comments General comments (skin integrity, edema, etc.): not assessed due to pain wiht bed-level exercises.    Exercises Total Joint Exercises Ankle Circles/Pumps: AROM;Strengthening;Both;10 reps;Supine Quad Sets: AROM;Strengthening;Both;10 reps;Supine Gluteal Sets: AROM;Strengthening;Both;10  reps;Supine Hip ABduction/ADduction: AROM;Left;10 reps;AAROM;Right;5 reps;Strengthening;Supine Straight Leg Raises: AROM;Left;10 reps;AAROM;Right;5 reps;Strengthening;Supine Other Exercises Other Exercises: Pt and wife educated on role of therapist and the services provided during hospital stay.   Assessment/Plan    PT Assessment Patient needs continued PT services  PT Problem List Decreased strength;Decreased range of motion;Decreased activity tolerance;Decreased balance;Decreased mobility;Pain       PT Treatment Interventions DME instruction;Gait training;Stair training;Functional mobility training;Therapeutic activities;Therapeutic exercise;Balance training;Neuromuscular  re-education    PT Goals (Current goals can be found in the Care Plan section)  Acute Rehab PT Goals Patient Stated Goal: decrease pain PT Goal Formulation: With patient/family Time For Goal Achievement: 08/24/20 Potential to Achieve Goals: Good    Frequency BID   Barriers to discharge        Co-evaluation               AM-PAC PT "6 Clicks" Mobility  Outcome Measure Help needed turning from your back to your side while in a flat bed without using bedrails?: A Lot Help needed moving from lying on your back to sitting on the side of a flat bed without using bedrails?: A Lot Help needed moving to and from a bed to a chair (including a wheelchair)?: A Lot Help needed standing up from a chair using your arms (e.g., wheelchair or bedside chair)?: Total Help needed to walk in hospital room?: Total Help needed climbing 3-5 steps with a railing? : Total 6 Click Score: 9    End of Session   Activity Tolerance: Patient limited by pain Patient left: in bed;with call bell/phone within reach;with bed alarm set;with family/visitor present Nurse Communication: Mobility status;Patient requests pain meds PT Visit Diagnosis: Unsteadiness on feet (R26.81);Other abnormalities of gait and mobility (R26.89);Muscle weakness (generalized) (M62.81);Difficulty in walking, not elsewhere classified (R26.2);Pain Pain - Right/Left: Right Pain - part of body: Hip    Time: [id number] PT Time Calculation (min) (ACUTE ONLY): 25 min   Charges:   PT Evaluation $PT Eval Low Complexity: 1 Low PT Treatments $Therapeutic Exercise: 8-22 mins        Gwenlyn Saran, PT, DPT 08/10/20, 1:30 PM   Christie Nottingham 08/10/2020, 1:21 PM

## 2020-08-10 NOTE — Plan of Care (Signed)
Patient in bed with call light within reach. Able to make needs known to staff. Takes med whole . IV intact receiving with iv  ABT no s/sx of adverse effects . Right hip dressing clean and dry. Will continue to monitor.

## 2020-08-11 LAB — CBC
HCT: 36.8 % — ABNORMAL LOW (ref 39.0–52.0)
Hemoglobin: 12.6 g/dL — ABNORMAL LOW (ref 13.0–17.0)
MCH: 31 pg (ref 26.0–34.0)
MCHC: 34.2 g/dL (ref 30.0–36.0)
MCV: 90.6 fL (ref 80.0–100.0)
Platelets: 243 10*3/uL (ref 150–400)
RBC: 4.06 MIL/uL — ABNORMAL LOW (ref 4.22–5.81)
RDW: 12.9 % (ref 11.5–15.5)
WBC: 9.3 10*3/uL (ref 4.0–10.5)
nRBC: 0 % (ref 0.0–0.2)

## 2020-08-11 MED ORDER — ACETAMINOPHEN 500 MG PO TABS
1000.0000 mg | ORAL_TABLET | Freq: Three times a day (TID) | ORAL | Status: DC
  Administered 2020-08-11 – 2020-08-13 (×6): 1000 mg via ORAL
  Filled 2020-08-11 (×6): qty 2

## 2020-08-11 NOTE — Progress Notes (Signed)
Physical Therapy Treatment Patient Details Name: Curtis Gregory MRN: [id number] DOB: 1950/02/17 Today's Date: 08/11/2020    History of Present Illness 70 y/o male s/p R THA  08/09/20.    PT Comments    Pt showed good effort with PT session this AM, however remains functionally very limited and needed direct assist with all mobility and especially with ambulation.  He had relatively flat affect but certainly followed instructions and showed good effort with all requested tasks.  He reported very little pain at rest on arrival but clearly was pain limited with most tasks involving the R LE.  He very much struggled with ambulation (>10 minutes to "walk" ~35f with excessive fatigue and need for PT to directly assist with advancement of the R LE.)  Pt plan and still hoping to return home at discharge, however per trajectory of progress this is not an appropriate/safe option, recommending STR at d/c for safety and best chance to appropriately advance post op.  Follow Up Recommendations  SNF;Supervision/Assistance - 24 hour     Equipment Recommendations  Rolling walker with 5" wheels;3in1 (PT)    Recommendations for Other Services       Precautions / Restrictions Precautions Precautions: Anterior Hip Restrictions Weight Bearing Restrictions: Yes RLE Weight Bearing: Weight bearing as tolerated    Mobility  Bed Mobility Overal bed mobility: Needs Assistance Bed Mobility: Supine to Sit     Supine to sit: Mod assist     General bed mobility comments: Pt struggled to get R LE moving in bed, able to inconsisently edge toward EOB bed been needing assist for ~80% of this effort.  She showed great effort in trying to pull torso toward EOB and up to sitting but ultimately needed considerable assist with all these tasks despite great motivation to do so on his own    Transfers Overall transfer level: Needs assistance Equipment used: Rolling walker (2 wheeled) Transfers: Sit to/from Stand Sit  to Stand: Min assist;From elevated surface         General transfer comment: Pt unable to rise on first attempt, however with slightly elevated bed (~2"), increased cuing for set up and sequencing and encouragement he was able to rise to standing w/o excessive direct assist.  Pt initially not putting weight on R heel at all, did better on second attempt but still very hesitant and guarded.  Ambulation/Gait Ambulation/Gait assistance: Mod assist;Min assist Gait Distance (Feet): 6 Feet Assistive device: Rolling walker (2 wheeled)       General Gait Details: Similar to prior documented efforts pt struggled to clear/lift R LE for steppage.  With cuing he did seem to get more confident with WBing on the R heel, but he still remained excessively guarded and did have consistent low grade buckling on the R, heavily reliant on the walker (and therefore fatiguing in UEs as well) during the effort. PT needed to directly assist advancing R foot for vast majority of the steps, on the few he did manage to move R LE forward it was never more than an inch or 2.  Pt showed great effort t/o the ambulation trial but was extremely functionally limited and to go this very modest distance took >10 minutes.   Stairs             Wheelchair Mobility    Modified Rankin (Stroke Patients Only)       Balance Overall balance assessment: Needs assistance Sitting-balance support: No upper extremity supported;Feet supported Sitting balance-Leahy Scale: Good  Standing balance support: Bilateral upper extremity supported Standing balance-Leahy Scale: Fair Standing balance comment: highly reliant on the walker to maintain upright, poor tolerance with functional WBing on the R, arms quick to fatigue to maintain standing                            Cognition Arousal/Alertness: Awake/alert Behavior During Therapy: Flat affect;WFL for tasks assessed/performed Overall Cognitive Status: Within  Functional Limits for tasks assessed                                        Exercises Total Joint Exercises Ankle Circles/Pumps:  (pt reports he was able to do some consistent AP reps over night) Quad Sets: Strengthening;10 reps Short Arc Quad: AAROM;AROM;10 reps (pt struggled with against gravity engagement) Heel Slides: AAROM;5 reps;Strengthening (pain and weakness with hip flexion, good effort with resisted leg ext) Hip ABduction/ADduction: AAROM;10 reps (Pt could not AROM more than a degree or 2, could give increased force with increased reps) Straight Leg Raises:  (unable) Other Exercises Other Exercises: Pt and family educated re: OT role, DME recs, d/c recs, falls prevention, adapted strategies for LB dressing/bathing, AE recs Other Exercises: LBD, sit<>stand, sitting/standing balance/tolerance    General Comments        Pertinent Vitals/Pain Pain Assessment: 0-10 Pain Score:  (reports very minimal pain at rest that increases with essentially any R LE movement (reports "7 or 8" /10 during transition to sitting)) Pain Location: R Incision Site Pain Descriptors / Indicators: Aching Pain Intervention(s): Limited activity within patient's tolerance;Repositioned;Premedicated before session;Monitored during session;Ice applied    Home Living                      Prior Function            PT Goals (current goals can now be found in the care plan section) Acute Rehab PT Goals Patient Stated Goal: decrease pain Progress towards PT goals: Progressing toward goals (slow progress)    Frequency    BID      PT Plan Discharge plan needs to be updated    Co-evaluation              AM-PAC PT "6 Clicks" Mobility   Outcome Measure  Help needed turning from your back to your side while in a flat bed without using bedrails?: A Lot Help needed moving from lying on your back to sitting on the side of a flat bed without using bedrails?: A Lot Help  needed moving to and from a bed to a chair (including a wheelchair)?: A Lot Help needed standing up from a chair using your arms (e.g., wheelchair or bedside chair)?: A Lot Help needed to walk in hospital room?: Total Help needed climbing 3-5 steps with a railing? : Total 6 Click Score: 10    End of Session Equipment Utilized During Treatment: Gait belt Activity Tolerance: Patient limited by pain;Patient limited by fatigue Patient left: with chair alarm set;with call bell/phone within reach Nurse Communication: Mobility status PT Visit Diagnosis: Unsteadiness on feet (R26.81);Other abnormalities of gait and mobility (R26.89);Muscle weakness (generalized) (M62.81);Difficulty in walking, not elsewhere classified (R26.2);Pain Pain - Right/Left: Right Pain - part of body: Hip     Time: 0826-0923 PT Time Calculation (min) (ACUTE ONLY): 57 min  Charges:  $Gait Training: 8-22 mins $Therapeutic  Exercise: 23-37 mins $Therapeutic Activity: 8-22 mins                     Kreg Shropshire, DPT 08/11/2020, 1:24 PM

## 2020-08-11 NOTE — Progress Notes (Addendum)
Occupational Therapy Treatment Patient Details Name: Curtis Gregory MRN: [id number] DOB: Dec 28, 1950 Today's Date: 08/11/2020    History of present illness Pt is s/p R THA on 08/09/20.   OT comments  Curtis Gregory was seen for OT treatment on this date. Upon arrival to room pt reclinedin chair with daughter present at bedside, wife joined during session. Pt requires MIN A don pants seated EOB using reacher and significantly increased time - pt requires assist for pulling up over rear in standing. Pt required ~20 min to complete dressing task 2/2 poor R hip/knee AROM r/t pain. Pt premedicated prior to session, reporting 5/10 pain at end of session, ice applied and RN in room to address.  Pt requires CGA + RW for sit<>stand from recliner and tolerates minimal stepping forward backward at [place] with increased time - pt demonstrates increased difficulty stepping forward compared to stepping backwards. Pt and family instructed in self care skills, falls prevention strategies, home/routines modifications, DME/AE for LB bathing and dressing tasks, and compression stocking mgt strategies. Pt return demonstrated understanding of instruction provided. Pt making good progress toward goals. Pt continues to benefit from skilled OT services to maximize return to PLOF and minimize risk of future falls, injury, caregiver burden, and readmission. Will continue to follow POC. Discharge recommendation remains appropriate if family is able to provide MIN A for all LB ADLs, toileting, and mobility - if family unable recommend STR.    Follow Up Recommendations  Home health OT;24/7 Supervision    Equipment Recommendations  3 in 1 bedside commode;Tub/shower seat;Other (comment) (2ww, adaptive equipment for LB ADLs including hit kit: sock aide, reacher, [place] shoe horn, and LH sponge)    Recommendations for Other Services      Precautions / Restrictions Precautions Precautions: Anterior Hip Restrictions Weight Bearing  Restrictions: Yes RLE Weight Bearing: Weight bearing as tolerated       Mobility Bed Mobility               General bed mobility comments: pt recieved and left in chair    Transfers Overall transfer level: Needs assistance Equipment used: Rolling walker (2 wheeled) Transfers: Sit to/from Stand Sit to Stand: Min guard              Balance Overall balance assessment: Needs assistance Sitting-balance support: No upper extremity supported;Feet supported Sitting balance-Leahy Scale: Good     Standing balance support: Single extremity supported;During functional activity Standing balance-Leahy Scale: Fair                             ADL either performed or assessed with clinical judgement   ADL Overall ADL's : Needs assistance/impaired                                       General ADL Comments: MIN A don pants seated EOB using reacher and significantly increased time - pt requires assist for pulling up over rear in standing. CGA + RW for ADL t/f and minimal stepping forward backward at [facility] - pt demonstrates difficulty stepping forward compared to stepping backwards      Cognition Arousal/Alertness: Awake/alert Behavior During Therapy: WFL for tasks assessed/performed Overall Cognitive Status: Within Functional Limits for tasks assessed  Exercises Exercises: Other exercises Other Exercises Other Exercises: Pt and family educated re: OT role, DME recs, d/c recs, falls prevention, adapted strategies for LB dressing/bathing, AE recs Other Exercises: LBD, sit<>stand, sitting/standing balance/tolerance           Pertinent Vitals/ Pain       Pain Assessment: 0-10 Pain Score: 5  Pain Location: R Incision Site Pain Descriptors / Indicators: Aching Pain Intervention(s): Limited activity within patient's tolerance;Repositioned;Premedicated before session;Monitored during session;Ice  applied   Frequency  Min 1X/week        Progress Toward Goals  OT Goals(current goals can now be found in the care plan section)  Progress towards OT goals: Progressing toward goals  Acute Rehab OT Goals Patient Stated Goal: decrease pain OT Goal Formulation: With patient Time For Goal Achievement: 08/24/20 Potential to Achieve Goals: Good ADL Goals Pt Will Perform Lower Body Dressing: with min assist;sit to/from stand;with adaptive equipment Pt Will Transfer to Toilet: with min guard assist;bedside commode Pt Will Perform Toileting - Clothing Manipulation and hygiene: with min guard assist;with min assist  Plan Discharge plan remains appropriate;Frequency remains appropriate       AM-PAC OT "6 Clicks" Daily Activity     Outcome Measure   Help from another person eating meals?: None Help from another person taking care of personal grooming?: A Little Help from another person toileting, which includes using toliet, bedpan, or urinal?: A Little Help from another person bathing (including washing, rinsing, drying)?: A Little Help from another person to put on and taking off regular upper body clothing?: A Little Help from another person to put on and taking off regular lower body clothing?: A Little 6 Click Score: 19    End of Session Equipment Utilized During Treatment: Rolling walker  OT Visit Diagnosis: Unsteadiness on feet (R26.81);Muscle weakness (generalized) (M62.81);Pain Pain - Right/Left: Right Pain - part of body: Hip   Activity Tolerance Patient tolerated treatment well   Patient Left in chair;with call bell/phone within reach;with nursing/sitter in room;with family/visitor present   Nurse Communication Mobility status        Time: 6728-9791 OT Time Calculation (min): 57 min  Charges: OT General Charges $OT Visit: 1 Visit OT Treatments $Self Care/Home Management : 53-67 mins  Dessie Coma, M.S. OTR/L  08/11/20, 11:12 AM  ascom [phone]

## 2020-08-11 NOTE — Progress Notes (Signed)
Physical Therapy Treatment Patient Details Name: Curtis Gregory MRN: [id number] DOB: 27-Nov-1950 Today's Date: 08/11/2020    History of Present Illness 70 y/o male s/p R THA  08/09/20.    PT Comments    Pt more awake and able to show consistent effort this afternoon but still very limited with hip flexion strength effecting gait, mobility.  He was better able to shift weight onto the R LE but continues to be highly reliant on UE/walker and needing constant cuing to insure he does not lean too far forward and encouragement/cuing with essentially every step with poor R LE step-through coordination despite heroic effort each step. Pt with considerable improvement over any prior session, however continue to need assist with all aspects of mobility, transfers and gait, STR is still the clinically most appropriate d/c disposition with current trajectory of function.  Family present and adamant that he will go home.   Follow Up Recommendations  SNF;Supervision/Assistance - 24 hour     Equipment Recommendations  Rolling walker with 5" wheels;3in1 (PT)    Recommendations for Other Services       Precautions / Restrictions Precautions Precautions: Anterior Hip Restrictions RLE Weight Bearing: Weight bearing as tolerated    Mobility  Bed Mobility Overal bed mobility: Needs Assistance Bed Mobility: Sit to Supine       Sit to supine: Mod assist   General bed mobility comments: Pt continues to struggle with against gravity motion with the R LE, ultimately needed heavy assist to get leg back into bed    Transfers Overall transfer level: Needs assistance Equipment used: Rolling walker (2 wheeled) Transfers: Sit to/from Stand Sit to Stand: Min assist;From elevated surface         General transfer comment: Pt continues to have issues shifting weight forward and maintaining forward momentum to attain standing, but showed good effort and better control with only light need for assist to  keep weight forward  Ambulation/Gait Ambulation/Gait assistance: Min assist Gait Distance (Feet): 45 Feet Assistive device: Rolling walker (2 wheeled)       General Gait Details: Pt did much better this afternoon with ambulation, continues to have a very difficult time advancing R LE, specifically elevating/flexing hip and clearing toes, highly reliant on the walker, heavy use of UEs and consistently having forward lean and needing cues to stay up right..  He initially had very discordant motor control doing these and needing extra time and excessive effort get step just a few inches.  He did slowly improve both the coordiantion and step length, however gunting and needing heroic effort each time to do so with heavy encouragement from family and consistent cuing from PT.  Pt's vitals remained stable with the effort but he was exhausted/sweating having gone ~45 ft.   Stairs             Wheelchair Mobility    Modified Rankin (Stroke Patients Only)       Balance Overall balance assessment: Needs assistance Sitting-balance support: No upper extremity supported;Feet supported Sitting balance-Leahy Scale: Good     Standing balance support: Bilateral upper extremity supported Standing balance-Leahy Scale: Fair Standing balance comment: highly reliant on the walker to maintain upright, poor tolerance with functional WBing on the R, arms quick to fatigue to maintain standing                            Cognition Arousal/Alertness: Awake/alert Behavior During Therapy: [facility] for tasks assessed/performed  Overall Cognitive Status: Within Functional Limits for tasks assessed                                        Exercises Total Joint Exercises Ankle Circles/Pumps: AROM;Strengthening;Both;10 reps;Supine Quad Sets: Strengthening;10 reps Short Arc Quad: AAROM;AROM;10 reps Heel Slides: AAROM;5 reps;Strengthening (resisted leg ext) Hip ABduction/ADduction:  AAROM;10 reps Straight Leg Raises:  (unable)    General Comments        Pertinent Vitals/Pain Pain Score: 6  Pain Location: R Incision Site    Home Living                      Prior Function            PT Goals (current goals can now be found in the care plan section) Progress towards PT goals: Progressing toward goals    Frequency    BID      PT Plan Current plan remains appropriate    Co-evaluation              AM-PAC PT "6 Clicks" Mobility   Outcome Measure  Help needed turning from your back to your side while in a flat bed without using bedrails?: A Little Help needed moving from lying on your back to sitting on the side of a flat bed without using bedrails?: A Lot Help needed moving to and from a bed to a chair (including a wheelchair)?: A Lot Help needed standing up from a chair using your arms (e.g., wheelchair or bedside chair)?: A Little Help needed to walk in hospital room?: A Lot Help needed climbing 3-5 steps with a railing? : Total 6 Click Score: 13    End of Session Equipment Utilized During Treatment: Gait belt Activity Tolerance: Patient limited by pain;Patient limited by fatigue Patient left: with call bell/phone within reach;with bed alarm set;with family/visitor present Nurse Communication: Mobility status PT Visit Diagnosis: Unsteadiness on feet (R26.81);Other abnormalities of gait and mobility (R26.89);Muscle weakness (generalized) (M62.81);Difficulty in walking, not elsewhere classified (R26.2);Pain Pain - Right/Left: Right Pain - part of body: Hip     Time: [id number] PT Time Calculation (min) (ACUTE ONLY): 47 min  Charges:  $Gait Training: 8-22 mins $Therapeutic Exercise: 8-22 mins $Therapeutic Activity: 8-22 mins                     Kreg Shropshire, DPT 08/11/2020, 6:11 PM

## 2020-08-11 NOTE — Progress Notes (Signed)
  Subjective: 2 Days Post-Op Procedure(s) (LRB): TOTAL HIP ARTHROPLASTY ANTERIOR APPROACH (Right) Wife and daughter at bedside. Patient reports pain as  significant .    Patient is well, and has had no acute complaints or problems Plan is to go Rehab after hospital stay per current PT recommendations, but wife is strongly opposed to SNF placement. Negative for chest pain and shortness of breath Fever: no Gastrointestinal: negative for nausea and vomiting.  Patient has not had a bowel movement.  Objective: Vital signs in last 24 hours: Temp:  [98 F (36.7 C)-99.5 F (37.5 C)] 98 F (36.7 C) (07/30 0926) Pulse Rate:  [57-73] 67 (07/30 0926) Resp:  [14-17] 14 (07/30 0926) BP: (95-131)/(54-72) 95/63 (07/30 0926) SpO2:  [93 %-100 %] 93 % (07/30 0926)  Intake/Output from previous day:  Intake/Output Summary (Last 24 hours) at 08/11/2020 1149 Last data filed at 08/11/2020 1027 Gross per 24 hour  Intake 360 ml  Output 800 ml  Net -440 ml    Intake/Output this shift: Total I/O In: 120 [P.O.:120] Out: 0   Labs: Recent Labs    08/09/20 1929 08/10/20 0606 08/11/20 0447  HGB 14.7 13.1 12.6*   Recent Labs    08/10/20 0606 08/11/20 0447  WBC 9.9 9.3  RBC 4.15* 4.06*  HCT 37.2* 36.8*  PLT 267 243   Recent Labs    08/09/20 1929 08/10/20 0606  NA  --  135  K  --  4.0  CL  --  105  CO2  --  26  BUN  --  17  CREATININE 0.92 1.01  GLUCOSE  --  146*  CALCIUM  --  7.9*   No results for input(s): LABPT, INR in the last 72 hours.   EXAM General - Patient is Appropriate, Oriented, and slightly drowsy Extremity - Neurovascular intact Dorsiflexion/Plantar flexion intact Compartment soft Dressing/Incision -prevena in place, no drainage noted  Motor Function - intact, moving foot and toes well on exam.   Assessment/Plan: 2 Days Post-Op Procedure(s) (LRB): TOTAL HIP ARTHROPLASTY ANTERIOR APPROACH (Right) Active Problems:   Status post total hip replacement,  right  Estimated body mass index is 32.96 kg/m as calculated from the following:   Height as of this encounter: 5' 8.5" (1.74 m).   Weight as of this encounter: 99.8 kg. Advance diet Up with therapy  Lengthy discussion was had with wife. Wife is adamant that patient is being overmedicated and that this is causing drowsiness which is interfering with his progress in therapy. Explained multiple times that the Norco and robaxin can be held which should help with drowsiness Given prior discussion with PT, will continue with scheduled tramadol at this time. Tylenol scheduled 1000 mg q8h, which will allow for 1 Norco q12h if needed. Also discussed likely need for SNF given slow progress with therapy, but wife is adamant that patient will be better served at home.      DVT Prophylaxis - Lovenox, Ted hose, and SCDs Weight-Bearing as tolerated to right leg  Cassell Smiles, PA-C [facility] Orthopaedic Surgery 08/11/2020, 11:49 AM

## 2020-08-11 NOTE — Progress Notes (Signed)
Sent Dr. Posey Pronto secure chat and let MD know that BP is 95/63 and HCTZ due this morning. Dr. Posey Pronto gave order to hold HCTZ dose this a.m.

## 2020-08-12 MED ORDER — ALBUTEROL SULFATE (2.5 MG/3ML) 0.083% IN NEBU: 3.0000 mL | INHALATION_SOLUTION | Freq: Four times a day (QID) | RESPIRATORY_TRACT | Status: DC | PRN

## 2020-08-12 MED ORDER — TRAMADOL HCL 50 MG PO TABS: 50.0000 mg | ORAL_TABLET | Freq: Four times a day (QID) | ORAL | Status: DC | PRN

## 2020-08-12 NOTE — Plan of Care (Signed)

## 2020-08-12 NOTE — Progress Notes (Signed)
  Subjective: 3 Days Post-Op Procedure(s) (LRB): TOTAL HIP ARTHROPLASTY ANTERIOR APPROACH (Right) Wife present at bedside. Patient reports pain as well-controlled.   Patient is well, and has had no acute complaints or problems Plan is to go Home after hospital stay. Negative for chest pain and shortness of breath Fever: no Gastrointestinal: negative for nausea and vomiting.  Patient has not had a bowel movement.  Objective: Vital signs in last 24 hours: Temp:  [97.9 F (36.6 C)-100.2 F (37.9 C)] 99.9 F (37.7 C) (07/31 0820) Pulse Rate:  [63-73] 69 (07/31 0820) Resp:  [16-20] 16 (07/31 0820) BP: (119-134)/(66-72) 126/67 (07/31 0820) SpO2:  [99 %-100 %] 100 % (07/31 0820)  Intake/Output from previous day:  Intake/Output Summary (Last 24 hours) at 08/12/2020 1102 Last data filed at 08/12/2020 1027 Gross per 24 hour  Intake 480 ml  Output 1475 ml  Net -995 ml    Intake/Output this shift: Total I/O In: 240 [P.O.:240] Out: 325 [Urine:325]  Labs: Recent Labs    08/09/20 1929 08/10/20 0606 08/11/20 0447  HGB 14.7 13.1 12.6*   Recent Labs    08/10/20 0606 08/11/20 0447  WBC 9.9 9.3  RBC 4.15* 4.06*  HCT 37.2* 36.8*  PLT 267 243   Recent Labs    08/09/20 1929 08/10/20 0606  NA  --  135  K  --  4.0  CL  --  105  CO2  --  26  BUN  --  17  CREATININE 0.92 1.01  GLUCOSE  --  146*  CALCIUM  --  7.9*   No results for input(s): LABPT, INR in the last 72 hours.   EXAM General - Patient is Alert, Appropriate, and Oriented Extremity - Neurovascular intact Dorsiflexion/Plantar flexion intact Compartment soft Dressing/Incision -Prevena in place with no drainage noted  Motor Function - intact, moving foot and toes well on exam.    Gastrointestinal- soft, nontender, and active bowel sounds   Assessment/Plan: 3 Days Post-Op Procedure(s) (LRB): TOTAL HIP ARTHROPLASTY ANTERIOR APPROACH (Right) Active Problems:   Status post total hip replacement,  right  Estimated body mass index is 32.96 kg/m as calculated from the following:   Height as of this encounter: 5' 8.5" (1.74 m).   Weight as of this encounter: 99.8 kg. Advance diet Up with therapy Plan for discharge tomorrow to home.  Patient made significant progress with therapy today. Able to complete lap, albeit slowly and with great effort. Spoke with patient about staying overnight for one more therapy session tomorrow before discharge home. Patient in agreement. Pain seems well-controlled on current regimen with scheduled tylenol and tramadol, and Norco up to twice daily if needed.     DVT Prophylaxis - Lovenox, Ted hose, and SCDs Weight-Bearing as tolerated to right leg  Cassell Smiles, PA-C [facility] Orthopaedic Surgery 08/12/2020, 11:02 AM

## 2020-08-12 NOTE — Progress Notes (Signed)
Physical Therapy Treatment Patient Details Name: Curtis Gregory MRN: [id number] DOB: February 16, 1950 Today's Date: 08/12/2020    History of Present Illness 70 y/o male s/p R THA  08/09/20.    PT Comments    Pt continues to show great effort despite pain and persistent difficulty with functional hip flexion tasks.  He continues to need AAROM (either PT or his UEs) with heel slides and functional movement in bed but has done more activity each session with this PT.  He needed >30 minutes to slowly but steadily circumambulate the nurses station.  He was able to occasionally piece together a few reciprocal even steps, but generally each R LE advancement effort was it's own effortful step.  Pt ultimately making gains but has been limited to to pain and weakness, again great effort and motivation.  Wife present t/o session and providing consistent encouragement.   Follow Up Recommendations  Home health PT;Supervision for mobility/OOB     Equipment Recommendations  Rolling walker with 5" wheels;3in1 (PT) (discussed bed rails with wife/pt)    Recommendations for Other Services       Precautions / Restrictions Precautions Precautions: Anterior Hip Restrictions RLE Weight Bearing: Weight bearing as tolerated    Mobility  Bed Mobility Overal bed mobility: Needs Assistance Bed Mobility: Sit to Supine     Supine to sit: Min assist (only very light assist with controlling R LE transition off EOB)     General bed mobility comments: Pt needed multiple minutes to scoot toward EOB, get LEs to EOB and very slowly (and with very heavy UE reliance on the rail) get himself up to sitting EOB    Transfers Overall transfer level: Needs assistance Equipment used: Rolling walker (2 wheeled) Transfers: Sit to/from Stand Sit to Stand: Min guard         General transfer comment: cuing for set up and sequencing, able to rise w/o direct assit though still hesitant and highly reliant on  UEs  Ambulation/Gait Ambulation/Gait assistance: Min guard Gait Distance (Feet): 200 Feet Assistive device: Rolling walker (2 wheeled)       General Gait Details: Pt continues to have very slow, labored gait but did manage, with great persistance, to circumambulate the nurses' station this AM - the effort took more than 30 minutes with a few standing rest breaks.  Per his usual pt struggled with R hip flexion/advancement, that was inconsistent but did generally improve with increased distance/time.  He was still very reliant on UEs/walker but did not fatigue in the UEs nearly as much this date.  Pt did have intermittent sections (generally less than 10 small steps at a time) where he was able to put together reciprocal steps with some momentum, though generally most steps were short/choppy with inconsistent timing/sequencing.  Great effort, vitals stable t/o the session.   Stairs             Wheelchair Mobility    Modified Rankin (Stroke Patients Only)       Balance Overall balance assessment: Needs assistance Sitting-balance support: No upper extremity supported;Feet supported Sitting balance-Leahy Scale: Good     Standing balance support: Bilateral upper extremity supported Standing balance-Leahy Scale: Fair Standing balance comment: better able to attain and maintain upright standing, still needing consistent reminders for posture.  However no LOBs, buckling or overt safety issues                            Cognition Arousal/Alertness:  Awake/alert Behavior During Therapy: WFL for tasks assessed/performed Overall Cognitive Status: Within Functional Limits for tasks assessed                                        Exercises Total Joint Exercises Quad Sets: Strengthening;10 reps Short Arc Quad: AROM;10 reps;Strengthening Heel Slides: Strengthening;10 reps;AAROM (resisted leg ext) Hip ABduction/ADduction: AAROM;10 reps    General Comments         Pertinent Vitals/Pain Pain Score: 7  Pain Location: R Incision Site    Home Living                      Prior Function            PT Goals (current goals can now be found in the care plan section) Progress towards PT goals: Progressing toward goals    Frequency    BID      PT Plan Current plan remains appropriate    Co-evaluation              AM-PAC PT "6 Clicks" Mobility   Outcome Measure  Help needed turning from your back to your side while in a flat bed without using bedrails?: A Little Help needed moving from lying on your back to sitting on the side of a flat bed without using bedrails?: A Lot Help needed moving to and from a bed to a chair (including a wheelchair)?: A Lot Help needed standing up from a chair using your arms (e.g., wheelchair or bedside chair)?: A Little Help needed to walk in hospital room?: A Lot Help needed climbing 3-5 steps with a railing? : Total 6 Click Score: 13    End of Session Equipment Utilized During Treatment: Gait belt Activity Tolerance: Patient limited by pain;Patient limited by fatigue Patient left: with call bell/phone within reach;with bed alarm set;with family/visitor present Nurse Communication: Mobility status PT Visit Diagnosis: Unsteadiness on feet (R26.81);Other abnormalities of gait and mobility (R26.89);Muscle weakness (generalized) (M62.81);Difficulty in walking, not elsewhere classified (R26.2);Pain Pain - Right/Left: Right Pain - part of body: Hip     Time: 0757-0909 PT Time Calculation (min) (ACUTE ONLY): 72 min  Charges:  $Gait Training: 23-37 mins $Therapeutic Exercise: 23-37 mins $Therapeutic Activity: 8-22 mins                     Kreg Shropshire, DPT 08/12/2020, 9:53 AM

## 2020-08-13 LAB — SURGICAL PATHOLOGY

## 2020-08-13 MED FILL — Bupivacaine Liposome Inj 1.3% (13.3 MG/ML): INTRAMUSCULAR | Qty: 20 | Status: AC

## 2020-08-13 MED FILL — Sodium Chloride Preservative Free (PF) Inj 0.9%: INTRAMUSCULAR | Qty: 20 | Status: AC

## 2020-08-13 MED FILL — Bupivacaine Inj 0.25% w/ Epinephrine 1:200000: INTRAMUSCULAR | Qty: 30 | Status: AC

## 2020-08-13 NOTE — Progress Notes (Signed)
VSS, IV cath removed last night site was c/d/I. Wound vac changed to the [place] educated pt on what it does and how it works. Discussed D/C packet with pt and his wife at bedside, both verbalized understanding of the teaching provided. Pt transported via W/C with D/C packet, scripts and all belongings (cpap) to private car.

## 2020-08-13 NOTE — Progress Notes (Signed)
   Subjective: 4 Days Post-Op Procedure(s) (LRB): TOTAL HIP ARTHROPLASTY ANTERIOR APPROACH (Right) Patient reports pain as mild.   Patient is well, and has had no acute complaints or problems Denies any CP, SOB, ABD pain. We will continue therapy today.  Plan is to go Home after hospital stay.  Objective: Vital signs in last 24 hours: Temp:  [98.6 F (37 C)-99.9 F (37.7 C)] 98.6 F (37 C) (08/01 0721) Pulse Rate:  [67-84] 70 (08/01 0721) Resp:  [16-20] 16 (08/01 0721) BP: (117-126)/(64-75) 121/75 (08/01 0721) SpO2:  [96 %-100 %] 96 % (08/01 0721)  Intake/Output from previous day: 07/31 0701 - 08/01 0700 In: 480 [P.O.:480] Out: 2000 [Urine:2000] Intake/Output this shift: No intake/output data recorded.  Recent Labs    08/11/20 0447  HGB 12.6*   Recent Labs    08/11/20 0447  WBC 9.3  RBC 4.06*  HCT 36.8*  PLT 243   No results for input(s): NA, K, CL, CO2, BUN, CREATININE, GLUCOSE, CALCIUM in the last 72 hours.  No results for input(s): LABPT, INR in the last 72 hours.  EXAM General - Patient is Alert, Appropriate, and Oriented Extremity - Neurovascular intact Sensation intact distally Intact pulses distally Dorsiflexion/Plantar flexion intact Incision: dressing C/D/I and no drainage No cellulitis present Compartment soft Dressing - no drainage and Praveena system Motor Function - intact, moving foot and toes well on exam.   Past Medical History:  Diagnosis Date   Asthma    Benign prostatic hypertrophy with urinary frequency 06/16/2014   Excessive urination at night 06/16/2014   GERD (gastroesophageal reflux disease)    Glaucoma    Hyperlipemia    Hypertension    Internal and external thrombosed hemorrhoids 03/15/2019   Sleep apnea     Assessment/Plan:   4 Days Post-Op Procedure(s) (LRB): TOTAL HIP ARTHROPLASTY ANTERIOR APPROACH (Right) Active Problems:   Status post total hip replacement, right  Estimated body mass index is 32.96 kg/m as  calculated from the following:   Height as of this encounter: 5' 8.5" (1.74 m).   Weight as of this encounter: 99.8 kg. Advance diet Up with therapy Vital signs stable Pain well controlled Care management to assist with discharge to home with home health PT today  DVT Prophylaxis - Lovenox, TED hose, and SCDs Weight-Bearing as tolerated to right leg   T. Rachelle Hora, PA-C [place] 08/13/2020, 7:53 AM

## 2020-08-13 NOTE — Progress Notes (Signed)
Physical Therapy Treatment Patient Details Name: Phoenyx Gallardo MRN: [id number] DOB: 1950-05-23 Today's Date: 08/13/2020    History of Present Illness 70 y/o male s/p R THA  08/09/20.    PT Comments    Pt sitting EOB with wife upon arrival.  Ready for session.  Stood and is able to walk to/from PT gym for stair training.  Discussed home safety and discharge with pt.  While gait remains slow and labor intensive for pt, he feels comfortable discharging home and wife is very involved in care.  Gait belt given for safety and instructed in use.  Encouraged +1 assist upon discharge and voiced understanding.   Follow Up Recommendations  Home health PT;Supervision for mobility/OOB     Equipment Recommendations  Rolling walker with 5" wheels;3in1 (PT)    Recommendations for Other Services       Precautions / Restrictions Precautions Precautions: Anterior Hip Restrictions Weight Bearing Restrictions: Yes RLE Weight Bearing: Weight bearing as tolerated    Mobility  Bed Mobility Overal bed mobility: Needs Assistance             General bed mobility comments: sitting EOB with wife upon arrival    Transfers Overall transfer level: Needs assistance Equipment used: Rolling walker (2 wheeled) Transfers: Sit to/from Stand Sit to Stand: Min guard            Ambulation/Gait Ambulation/Gait assistance: Min guard;Min assist Gait Distance (Feet): 200 Feet Assistive device: Rolling walker (2 wheeled) Gait Pattern/deviations: Step-to pattern Gait velocity: decreased   General Gait Details: slow labored gait.  some unsteadiness remains but no formal LOB's.  irregular step pattern and occasionall difficulty advancing foot but overall progressing.   Stairs Stairs: Yes Stairs assistance: Min guard;Min assist Stair Management: Two rails;Backwards;Forwards;With walker Number of Stairs: 5 General stair comments: 4 steps to simulate home and 1 threshold step with  walker   Wheelchair Mobility    Modified Rankin (Stroke Patients Only)       Balance Overall balance assessment: Needs assistance Sitting-balance support: No upper extremity supported;Feet supported Sitting balance-Leahy Scale: Good     Standing balance support: Bilateral upper extremity supported Standing balance-Leahy Scale: Fair Standing balance comment: better able to attain and maintain upright standing, still needing consistent reminders for posture.  However no LOBs, buckling or overt safety issues                            Cognition Arousal/Alertness: Awake/alert Behavior During Therapy: WFL for tasks assessed/performed Overall Cognitive Status: Within Functional Limits for tasks assessed                                        Exercises Other Exercises Other Exercises: discussed safety at home and +1 assist from wife initially upon discharge    General Comments        Pertinent Vitals/Pain Pain Assessment: 0-10 Pain Score: 2  Pain Location: R Incision Site Pain Descriptors / Indicators: Aching Pain Intervention(s): Limited activity within patient's tolerance;Monitored during session;Repositioned    Home Living                      Prior Function            PT Goals (current goals can now be found in the care plan section) Progress towards PT goals: Progressing toward goals  Frequency    BID      PT Plan Current plan remains appropriate    Co-evaluation              AM-PAC PT "6 Clicks" Mobility   Outcome Measure  Help needed turning from your back to your side while in a flat bed without using bedrails?: A Little Help needed moving from lying on your back to sitting on the side of a flat bed without using bedrails?: A Lot Help needed moving to and from a bed to a chair (including a wheelchair)?: A Little Help needed standing up from a chair using your arms (e.g., wheelchair or bedside chair)?: A  Little Help needed to walk in hospital room?: A Little Help needed climbing 3-5 steps with a railing? : A Little 6 Click Score: 17    End of Session Equipment Utilized During Treatment: Gait belt Activity Tolerance: Patient limited by pain;Patient tolerated treatment well Patient left: with call bell/phone within reach;with family/visitor present;in chair;with nursing/sitter in room Nurse Communication: Mobility status PT Visit Diagnosis: Unsteadiness on feet (R26.81);Other abnormalities of gait and mobility (R26.89);Muscle weakness (generalized) (M62.81);Difficulty in walking, not elsewhere classified (R26.2);Pain Pain - Right/Left: Right Pain - part of body: Hip     Time: [id number] PT Time Calculation (min) (ACUTE ONLY): 41 min  Charges:  $Gait Training: 23-37 mins $Therapeutic Activity: 8-22 mins                    Chesley Noon, PTA 08/13/20, 10:00 AM , 9:59 AM

## 2020-08-13 NOTE — Plan of Care (Signed)
Plan Of Care Ongoing  Problem: Education: Goal: Knowledge of General Education information will improve Description: Including pain rating scale, medication(s)/side effects and non-pharmacologic comfort measures Outcome: Progressing   Problem: Health Behavior/Discharge Planning: Goal: Ability to manage health-related needs will improve Outcome: Progressing   Problem: Clinical Measurements: Goal: Ability to maintain clinical measurements within normal limits will improve Outcome: Progressing Goal: Will remain free from infection Outcome: Progressing Goal: Diagnostic test results will improve Outcome: Progressing Goal: Respiratory complications will improve Outcome: Progressing Goal: Cardiovascular complication will be avoided Outcome: Progressing   Problem: Activity: Goal: Risk for activity intolerance will decrease Outcome: Progressing   Problem: Nutrition: Goal: Adequate nutrition will be maintained Outcome: Progressing   Problem: Coping: Goal: Level of anxiety will decrease Outcome: Progressing   Problem: Elimination: Goal: Will not experience complications related to bowel motility Outcome: Progressing Goal: Will not experience complications related to urinary retention Outcome: Progressing   Problem: Pain Managment: Goal: General experience of comfort will improve Outcome: Progressing   Problem: Safety: Goal: Ability to remain free from injury will improve Outcome: Progressing   Problem: Skin Integrity: Goal: Risk for impaired skin integrity will decrease Outcome: Progressing

## 2020-08-14 ENCOUNTER — Encounter: Payer: Self-pay | Admitting: Orthopedic Surgery

## 2020-08-14 DIAGNOSIS — E785 Hyperlipidemia, unspecified: Secondary | ICD-10-CM | POA: Diagnosis not present

## 2020-08-14 DIAGNOSIS — Z96641 Presence of right artificial hip joint: Secondary | ICD-10-CM | POA: Diagnosis not present

## 2020-08-14 DIAGNOSIS — Z471 Aftercare following joint replacement surgery: Secondary | ICD-10-CM | POA: Diagnosis not present

## 2020-08-14 DIAGNOSIS — N529 Male erectile dysfunction, unspecified: Secondary | ICD-10-CM | POA: Diagnosis not present

## 2020-08-14 DIAGNOSIS — R350 Frequency of micturition: Secondary | ICD-10-CM | POA: Diagnosis not present

## 2020-08-14 DIAGNOSIS — H409 Unspecified glaucoma: Secondary | ICD-10-CM | POA: Diagnosis not present

## 2020-08-14 DIAGNOSIS — N401 Enlarged prostate with lower urinary tract symptoms: Secondary | ICD-10-CM | POA: Diagnosis not present

## 2020-08-14 DIAGNOSIS — K219 Gastro-esophageal reflux disease without esophagitis: Secondary | ICD-10-CM | POA: Diagnosis not present

## 2020-08-14 DIAGNOSIS — J302 Other seasonal allergic rhinitis: Secondary | ICD-10-CM | POA: Diagnosis not present

## 2020-08-14 DIAGNOSIS — I10 Essential (primary) hypertension: Secondary | ICD-10-CM | POA: Diagnosis not present

## 2020-08-14 DIAGNOSIS — G4730 Sleep apnea, unspecified: Secondary | ICD-10-CM | POA: Diagnosis not present

## 2020-08-14 DIAGNOSIS — M25561 Pain in right knee: Secondary | ICD-10-CM | POA: Diagnosis not present

## 2020-08-14 DIAGNOSIS — M1990 Unspecified osteoarthritis, unspecified site: Secondary | ICD-10-CM | POA: Diagnosis not present

## 2020-08-14 DIAGNOSIS — J4520 Mild intermittent asthma, uncomplicated: Secondary | ICD-10-CM | POA: Diagnosis not present

## 2020-08-14 DIAGNOSIS — E559 Vitamin D deficiency, unspecified: Secondary | ICD-10-CM | POA: Diagnosis not present

## 2020-08-14 MED ORDER — SODIUM CHLORIDE 0.9 % IR SOLN: Status: DC | PRN

## 2020-08-14 MED ORDER — HEMOSTATIC AGENTS (NO CHARGE) OPTIME
TOPICAL | Status: DC | PRN
  Administered 2020-08-09: 2 via TOPICAL

## 2020-08-16 DIAGNOSIS — Z471 Aftercare following joint replacement surgery: Secondary | ICD-10-CM | POA: Diagnosis not present

## 2020-08-16 DIAGNOSIS — J4520 Mild intermittent asthma, uncomplicated: Secondary | ICD-10-CM | POA: Diagnosis not present

## 2020-08-16 DIAGNOSIS — I10 Essential (primary) hypertension: Secondary | ICD-10-CM | POA: Diagnosis not present

## 2020-08-16 DIAGNOSIS — E785 Hyperlipidemia, unspecified: Secondary | ICD-10-CM | POA: Diagnosis not present

## 2020-08-16 DIAGNOSIS — Z96641 Presence of right artificial hip joint: Secondary | ICD-10-CM | POA: Diagnosis not present

## 2020-08-16 DIAGNOSIS — E559 Vitamin D deficiency, unspecified: Secondary | ICD-10-CM | POA: Diagnosis not present

## 2020-08-17 ENCOUNTER — Encounter: Payer: Self-pay | Admitting: Family Medicine

## 2020-08-17 ENCOUNTER — Telehealth (INDEPENDENT_AMBULATORY_CARE_PROVIDER_SITE_OTHER): Payer: Medicare Other | Admitting: Family Medicine

## 2020-08-17 VITALS — Ht 68.5 in | Wt 220.0 lb

## 2020-08-17 DIAGNOSIS — G2581 Restless legs syndrome: Secondary | ICD-10-CM | POA: Diagnosis not present

## 2020-08-17 DIAGNOSIS — Z96641 Presence of right artificial hip joint: Secondary | ICD-10-CM

## 2020-08-17 MED ORDER — ROPINIROLE HCL 1 MG PO TABS: 1.0000 mg | ORAL_TABLET | Freq: Every day | ORAL | 2 refills | Status: DC

## 2020-08-17 NOTE — Patient Instructions (Addendum)
Start Ropinirole for restless leg, go up on dose as discussed.  Okay to take other meds they prescribed, don't take hydrocodone and tramadol at same time  Please schedule a Follow-up Appointment to: Return if symptoms worsen or fail to improve.  If you have any other questions or concerns, please feel free to call the office or send a message through [place]. You may also schedule an earlier appointment if necessary.  Additionally, you may be receiving a survey about your experience at our office within a few days to 1 week by e-mail or mail. We value your feedback.  Curtis Putnam, DO [place]

## 2020-08-17 NOTE — Progress Notes (Signed)
Subjective:    Patient ID: Curtis Gregory, male    DOB: 1950/11/06, 70 y.o.   MRN: [id number]  [name] is a 70 y.o. male presenting on 08/17/2020 for restless leg syndrome  Virtual / Telehealth Encounter - Video Visit via MyChart The purpose of this virtual visit is to provide medical care while limiting exposure to the novel coronavirus (COVID19) for both patient and office staff.  Consent was obtained for remote visit:  Yes.   Answered questions that patient had about telehealth interaction:  Yes.   I discussed the limitations, risks, security and privacy concerns of performing an evaluation and management service by video/telephone. I also discussed with the patient that there may be a patient responsible charge related to this service. The patient expressed understanding and agreed to proceed.  Patient Location: Home Provider Location: [facility] (Office)  Participants in virtual visit: - Patient: Curtis Gregory - CMA: Orinda Kenner, CMA - Provider: Dr Parks Ranger   HPI  HFU R Total Hip Arthroplasty Recent hospitalization Dr Rudene Christians Given pain medication Tramadol PRN, Hydrocodone PRN, Methocarbamol, he has questions on which ones to take. S/p R Hip Arthroplasty Had prior history of  RLS now it is worse at night following surgery Never on med for it. He declines gabapentin  Med question On new meds and interaction with other meds  Allergies Allegra, Flonase PRN only  HTN Taking HCTZ 12.'5mg'$  daily  GERD Omeprazole  HLD Statin    Current Outpatient Medications:    albuterol (PROVENTIL HFA;VENTOLIN HFA) 108 (90 BASE) MCG/ACT inhaler, Inhale 1-2 puffs into the lungs every 6 (six) hours as needed for wheezing or shortness of breath. Reported on 06/04/2015, Disp: , Rfl:    Cholecalciferol (VITAMIN D3) 50 MCG (2000 UT) capsule, Take 2,000 Units by mouth daily., Disp: , Rfl:    docusate sodium (COLACE) 100 MG capsule, Take 1 capsule (100 mg total) by  mouth 2 (two) times daily., Disp: 10 capsule, Rfl: 0   enoxaparin (LOVENOX) 40 MG/0.4ML injection, Inject 0.4 mLs (40 mg total) into the skin daily for 14 days., Disp: 5.6 mL, Rfl: 0   fexofenadine (ALLEGRA) 180 MG tablet, TAKE 1 TABLET BY MOUTH EVERY DAY (Patient taking differently: Take 180 mg by mouth daily as needed for allergies.), Disp: 90 tablet, Rfl: 2   finasteride (PROSCAR) 5 MG tablet, Take 1 tablet (5 mg total) by mouth daily., Disp: 90 tablet, Rfl: 3   fluticasone (FLONASE) 50 MCG/ACT nasal spray, Place 2 sprays into both nostrils daily. Use for 4-6 weeks then stop and use seasonally or as needed. (Patient taking differently: Place 2 sprays into both nostrils daily as needed for rhinitis.), Disp: 16 g, Rfl: 3   hydrochlorothiazide (HYDRODIURIL) 12.5 MG tablet, TAKE 1 TABLET BY MOUTH EVERY DAY (Patient taking differently: Take 12.5 mg by mouth daily.), Disp: 90 tablet, Rfl: 1   HYDROcodone-acetaminophen (NORCO/VICODIN) 5-325 MG tablet, Take 1-2 tablets by mouth every 4 (four) hours as needed for moderate pain (pain score 4-6)., Disp: 30 tablet, Rfl: 0   hydrocortisone (ANUSOL-HC) 25 MG suppository, Place 1 suppository (25 mg total) rectally 2 (two) times daily. For 7 days, Disp: 14 suppository, Rfl: 2   latanoprost (XALATAN) 0.005 % ophthalmic solution, Place 1 drop into both eyes at bedtime. , Disp: , Rfl: 0   methocarbamol (ROBAXIN) 500 MG tablet, Take 1 tablet (500 mg total) by mouth every 6 (six) hours as needed for muscle spasms., Disp: 30 tablet, Rfl: 0  omeprazole (PRILOSEC) 20 MG capsule, TAKE 1 CAPSULE BY MOUTH EVERY DAY (Patient taking differently: Take 20 mg by mouth daily.), Disp: 90 capsule, Rfl: 1   rOPINIRole (REQUIP) 1 MG tablet, Take 1 tablet (1 mg total) by mouth at bedtime. Start with half tablet 0.5 nightly and gradually increase to 1 tablet as tolerated, Disp: 30 tablet, Rfl: 2   rosuvastatin (CRESTOR) 20 MG tablet, TAKE 1 TABLET BY MOUTH EVERYDAY AT BEDTIME (Patient  taking differently: Take 20 mg by mouth daily.), Disp: 90 tablet, Rfl: 1   sildenafil (REVATIO) 20 MG tablet, TAKE TWO TO FIVE TABLETS BY MOUTH ONE HOUR PRIOR TO INTERCOURSE, Disp: 30 tablet, Rfl: 1   tamsulosin (FLOMAX) 0.4 MG CAPS capsule, Take 1 capsule (0.4 mg total) by mouth daily after breakfast., Disp: 90 capsule, Rfl: 3   timolol (TIMOPTIC-XR) 0.5 % ophthalmic gel-forming, Place 1 drop into the left eye at bedtime. , Disp: , Rfl: 0   traMADol (ULTRAM) 50 MG tablet, Take 1 tablet (50 mg total) by mouth every 6 (six) hours as needed., Disp: 30 tablet, Rfl: 0   Depression screen Curtis Gregory 2/9 07/04/2020 01/24/2020 03/15/2019  Decreased Interest 0 0 0  Down, Depressed, Hopeless 1 0 0  PHQ - 2 Score 1 0 0  Altered sleeping 1 - -  Tired, decreased energy 0 - -  Change in appetite 0 - -  Feeling bad or failure about yourself  1 - -  Trouble concentrating 0 - -  Moving slowly or fidgety/restless 0 - -  Suicidal thoughts 0 - -  PHQ-9 Score 3 - -  Difficult doing work/chores Not difficult at all - -    Social History   Tobacco Use   Smoking status: Never   Smokeless tobacco: Never  Vaping Use   Vaping Use: Never used  Substance Use Topics   Alcohol use: Yes    Alcohol/week: 1.0 standard drink    Types: 1 Glasses of wine per week    Comment: occasional   Drug use: No    Review of Systems Per HPI unless specifically indicated above     Objective:    Ht 5' 8.5" (1.74 m)   Wt 220 lb (99.8 kg)   BMI 32.96 kg/m   Wt Readings from Last 3 Encounters:  08/17/20 220 lb (99.8 kg)  08/09/20 220 lb (99.8 kg)  08/01/20 221 lb 8 oz (100.5 kg)    Physical Exam  Note examination was completely remotely via video observation objective data only  Gen - well-appearing, no acute distress or apparent pain, comfortable HEENT - eyes appear clear without discharge or redness Heart/Lungs - cannot examine virtually - observed no evidence of coughing or labored breathing. Abd - cannot examine  virtually  Skin - face visible today- no rash Neuro - awake, alert, oriented Psych - not anxious appearing   Results for orders placed or performed during the hospital encounter of 08/09/20  CBC  Result Value Ref Range   WBC 10.3 4.0 - 10.5 K/uL   RBC 4.57 4.22 - 5.81 MIL/uL   Hemoglobin 14.7 13.0 - 17.0 g/dL   HCT 43.6 39.0 - 52.0 %   MCV 95.4 80.0 - 100.0 fL   MCH 32.2 26.0 - 34.0 pg   MCHC 33.7 30.0 - 36.0 g/dL   RDW 12.8 11.5 - 15.5 %   Platelets 246 150 - 400 K/uL   nRBC 0.0 0.0 - 0.2 %  Creatinine, serum  Result Value Ref Range  Creatinine, Ser 0.92 0.61 - 1.24 mg/dL   GFR, Estimated >60 >60 mL/min  CBC  Result Value Ref Range   WBC 9.9 4.0 - 10.5 K/uL   RBC 4.15 (L) 4.22 - 5.81 MIL/uL   Hemoglobin 13.1 13.0 - 17.0 g/dL   HCT 37.2 (L) 39.0 - 52.0 %   MCV 89.6 80.0 - 100.0 fL   MCH 31.6 26.0 - 34.0 pg   MCHC 35.2 30.0 - 36.0 g/dL   RDW 12.2 11.5 - 15.5 %   Platelets 267 150 - 400 K/uL   nRBC 0.0 0.0 - 0.2 %  Basic metabolic panel  Result Value Ref Range   Sodium 135 135 - 145 mmol/L   Potassium 4.0 3.5 - 5.1 mmol/L   Chloride 105 98 - 111 mmol/L   CO2 26 22 - 32 mmol/L   Glucose, Bld 146 (H) 70 - 99 mg/dL   BUN 17 8 - 23 mg/dL   Creatinine, Ser 1.01 0.61 - 1.24 mg/dL   Calcium 7.9 (L) 8.9 - 10.3 mg/dL   GFR, Estimated >60 >60 mL/min   Anion gap 4 (L) 5 - 15  CBC  Result Value Ref Range   WBC 9.3 4.0 - 10.5 K/uL   RBC 4.06 (L) 4.22 - 5.81 MIL/uL   Hemoglobin 12.6 (L) 13.0 - 17.0 g/dL   HCT 36.8 (L) 39.0 - 52.0 %   MCV 90.6 80.0 - 100.0 fL   MCH 31.0 26.0 - 34.0 pg   MCHC 34.2 30.0 - 36.0 g/dL   RDW 12.9 11.5 - 15.5 %   Platelets 243 150 - 400 K/uL   nRBC 0.0 0.0 - 0.2 %  ABO/Rh  Result Value Ref Range   ABO/RH(D)      A POS Performed at [facility], [address] [place], [place] [address]   Surgical pathology  Result Value Ref Range   SURGICAL PATHOLOGY      SURGICAL PATHOLOGY CASE: [id number] PATIENT: Almeta Monas Surgical  Pathology Report     Specimen Submitted: A. Femoral head, right  Clinical History: Primary osteoarthritis of right hip M16.11      DIAGNOSIS: A. FEMORAL HEAD, RIGHT; ARTHROPLASTY: - DEGENERATIVE OSTEOARTHROPATHY. - NEGATIVE FOR MALIGNANCY.   GROSS DESCRIPTION: A. Labeled: Right femoral head Received: Formalin Collection time: 3:24 PM on 08/09/2020 Placed into formalin time: 4:18 PM on 08/09/2020 Size of specimen:      Head -5.5 x 5.5 x 4.4 cm      Neck -4.8 x 3.7 x 2 cm      Additional tissue: None grossly identified. Articular surface: The articular surface is tan and smooth with a central 6 x 5.1 cm area of roughening. Cut surface: The cut surface is tan-yellow and firm. Other findings: None grossly identified.  Block summary: 1 - representative sections of roughened cartilage to adjacent cartilage  Tissue decalcification: Cassette 1  RB 08/10/2020  Final Diagnosis performe d by Betsy Pries, MD.   Electronically signed 08/13/2020 10:14:19AM The electronic signature indicates that the named Attending Pathologist has evaluated the specimen Technical component performed at [facility], [address] [place], [place] [address] Lab: [phone] Dir: Rush Farmer, MD, MMM  Professional component performed at [facility], [facility], [place], [place], [place] [address] Lab: [phone] Dir: Dellia Nims. Reuel Derby, MD       Assessment & Plan:   Problem List Items Addressed This Visit   None Visit Diagnoses     Restless leg syndrome    -  Primary   Relevant Medications   rOPINIRole (REQUIP) 1 MG tablet   Status post total replacement of right hip           RLS S/p R Hip Total Arthroplasty Subacute on chronic, worse now after R Hip replacement Start Ropinirole start 0.'5mg'$  nightly gradual up to '1mg'$  titrate, can adjust higher dose if indicated. Consider Mirapex or Gabapentin again in future if indicated  Reviewed discharge meds and review  options for pain  management with his current meds Hydrocodone Tramadol Methocarbamol reviewed. Checked for interactions and other questions.  Meds ordered this encounter  Medications   rOPINIRole (REQUIP) 1 MG tablet    Sig: Take 1 tablet (1 mg total) by mouth at bedtime. Start with half tablet 0.5 nightly and gradually increase to 1 tablet as tolerated    Dispense:  30 tablet    Refill:  2      Follow up plan: Return if symptoms worsen or fail to improve.  Patient verbalizes understanding with the above medical recommendations including the limitation of remote medical advice.  Specific follow-up and call-back criteria were given for patient to follow-up or seek medical care more urgently if needed.  Total duration of direct patient care provided via video conference: 15 minutes    [name], [facility] 08/17/2020, 11:51 AM

## 2020-08-20 DIAGNOSIS — E785 Hyperlipidemia, unspecified: Secondary | ICD-10-CM | POA: Diagnosis not present

## 2020-08-20 DIAGNOSIS — Z471 Aftercare following joint replacement surgery: Secondary | ICD-10-CM | POA: Diagnosis not present

## 2020-08-20 DIAGNOSIS — E559 Vitamin D deficiency, unspecified: Secondary | ICD-10-CM | POA: Diagnosis not present

## 2020-08-20 DIAGNOSIS — I10 Essential (primary) hypertension: Secondary | ICD-10-CM | POA: Diagnosis not present

## 2020-08-20 DIAGNOSIS — J4520 Mild intermittent asthma, uncomplicated: Secondary | ICD-10-CM | POA: Diagnosis not present

## 2020-08-20 DIAGNOSIS — Z96641 Presence of right artificial hip joint: Secondary | ICD-10-CM | POA: Diagnosis not present

## 2020-08-22 DIAGNOSIS — Z96641 Presence of right artificial hip joint: Secondary | ICD-10-CM | POA: Diagnosis not present

## 2020-08-22 DIAGNOSIS — J4520 Mild intermittent asthma, uncomplicated: Secondary | ICD-10-CM | POA: Diagnosis not present

## 2020-08-22 DIAGNOSIS — I10 Essential (primary) hypertension: Secondary | ICD-10-CM | POA: Diagnosis not present

## 2020-08-22 DIAGNOSIS — E559 Vitamin D deficiency, unspecified: Secondary | ICD-10-CM | POA: Diagnosis not present

## 2020-08-22 DIAGNOSIS — Z471 Aftercare following joint replacement surgery: Secondary | ICD-10-CM | POA: Diagnosis not present

## 2020-08-22 DIAGNOSIS — E785 Hyperlipidemia, unspecified: Secondary | ICD-10-CM | POA: Diagnosis not present

## 2020-08-27 DIAGNOSIS — Z96641 Presence of right artificial hip joint: Secondary | ICD-10-CM | POA: Diagnosis not present

## 2020-08-27 DIAGNOSIS — I10 Essential (primary) hypertension: Secondary | ICD-10-CM | POA: Diagnosis not present

## 2020-08-27 DIAGNOSIS — E559 Vitamin D deficiency, unspecified: Secondary | ICD-10-CM | POA: Diagnosis not present

## 2020-08-27 DIAGNOSIS — Z471 Aftercare following joint replacement surgery: Secondary | ICD-10-CM | POA: Diagnosis not present

## 2020-08-27 DIAGNOSIS — J4520 Mild intermittent asthma, uncomplicated: Secondary | ICD-10-CM | POA: Diagnosis not present

## 2020-08-27 DIAGNOSIS — E785 Hyperlipidemia, unspecified: Secondary | ICD-10-CM | POA: Diagnosis not present

## 2020-08-29 DIAGNOSIS — Z471 Aftercare following joint replacement surgery: Secondary | ICD-10-CM | POA: Diagnosis not present

## 2020-08-29 DIAGNOSIS — E559 Vitamin D deficiency, unspecified: Secondary | ICD-10-CM | POA: Diagnosis not present

## 2020-08-29 DIAGNOSIS — J4520 Mild intermittent asthma, uncomplicated: Secondary | ICD-10-CM | POA: Diagnosis not present

## 2020-08-29 DIAGNOSIS — Z96641 Presence of right artificial hip joint: Secondary | ICD-10-CM | POA: Diagnosis not present

## 2020-08-29 DIAGNOSIS — I10 Essential (primary) hypertension: Secondary | ICD-10-CM | POA: Diagnosis not present

## 2020-08-29 DIAGNOSIS — E785 Hyperlipidemia, unspecified: Secondary | ICD-10-CM | POA: Diagnosis not present

## 2020-09-04 DIAGNOSIS — Z471 Aftercare following joint replacement surgery: Secondary | ICD-10-CM | POA: Diagnosis not present

## 2020-09-04 DIAGNOSIS — I10 Essential (primary) hypertension: Secondary | ICD-10-CM | POA: Diagnosis not present

## 2020-09-04 DIAGNOSIS — J4520 Mild intermittent asthma, uncomplicated: Secondary | ICD-10-CM | POA: Diagnosis not present

## 2020-09-04 DIAGNOSIS — E785 Hyperlipidemia, unspecified: Secondary | ICD-10-CM | POA: Diagnosis not present

## 2020-09-04 DIAGNOSIS — Z96641 Presence of right artificial hip joint: Secondary | ICD-10-CM | POA: Diagnosis not present

## 2020-09-04 DIAGNOSIS — E559 Vitamin D deficiency, unspecified: Secondary | ICD-10-CM | POA: Diagnosis not present

## 2020-09-07 ENCOUNTER — Other Ambulatory Visit: Payer: Self-pay | Admitting: Family Medicine

## 2020-09-07 DIAGNOSIS — E7849 Other hyperlipidemia: Secondary | ICD-10-CM

## 2020-09-07 NOTE — Telephone Encounter (Signed)
Requested medications are due for refill today No  Requested medications are on the active medication list yes  Last refill 7/13 filled for 90 day supply  Last visit Do not see it addressed in one year, last lab was 08/2017  Future visit scheduled 10/16/20  Notes to clinic requesting too early.

## 2020-09-11 DIAGNOSIS — Z96641 Presence of right artificial hip joint: Secondary | ICD-10-CM | POA: Diagnosis not present

## 2020-09-11 DIAGNOSIS — Z471 Aftercare following joint replacement surgery: Secondary | ICD-10-CM | POA: Diagnosis not present

## 2020-09-11 DIAGNOSIS — E559 Vitamin D deficiency, unspecified: Secondary | ICD-10-CM | POA: Diagnosis not present

## 2020-09-11 DIAGNOSIS — I10 Essential (primary) hypertension: Secondary | ICD-10-CM | POA: Diagnosis not present

## 2020-09-11 DIAGNOSIS — E785 Hyperlipidemia, unspecified: Secondary | ICD-10-CM | POA: Diagnosis not present

## 2020-09-11 DIAGNOSIS — J4520 Mild intermittent asthma, uncomplicated: Secondary | ICD-10-CM | POA: Diagnosis not present

## 2020-09-13 DIAGNOSIS — J4520 Mild intermittent asthma, uncomplicated: Secondary | ICD-10-CM | POA: Diagnosis not present

## 2020-09-13 DIAGNOSIS — M25561 Pain in right knee: Secondary | ICD-10-CM | POA: Diagnosis not present

## 2020-09-13 DIAGNOSIS — K219 Gastro-esophageal reflux disease without esophagitis: Secondary | ICD-10-CM | POA: Diagnosis not present

## 2020-09-13 DIAGNOSIS — G4730 Sleep apnea, unspecified: Secondary | ICD-10-CM | POA: Diagnosis not present

## 2020-09-13 DIAGNOSIS — H409 Unspecified glaucoma: Secondary | ICD-10-CM | POA: Diagnosis not present

## 2020-09-13 DIAGNOSIS — J302 Other seasonal allergic rhinitis: Secondary | ICD-10-CM | POA: Diagnosis not present

## 2020-09-13 DIAGNOSIS — E785 Hyperlipidemia, unspecified: Secondary | ICD-10-CM | POA: Diagnosis not present

## 2020-09-13 DIAGNOSIS — Z96641 Presence of right artificial hip joint: Secondary | ICD-10-CM | POA: Diagnosis not present

## 2020-09-13 DIAGNOSIS — Z471 Aftercare following joint replacement surgery: Secondary | ICD-10-CM | POA: Diagnosis not present

## 2020-09-13 DIAGNOSIS — E559 Vitamin D deficiency, unspecified: Secondary | ICD-10-CM | POA: Diagnosis not present

## 2020-09-13 DIAGNOSIS — N529 Male erectile dysfunction, unspecified: Secondary | ICD-10-CM | POA: Diagnosis not present

## 2020-09-13 DIAGNOSIS — R350 Frequency of micturition: Secondary | ICD-10-CM | POA: Diagnosis not present

## 2020-09-13 DIAGNOSIS — I10 Essential (primary) hypertension: Secondary | ICD-10-CM | POA: Diagnosis not present

## 2020-09-13 DIAGNOSIS — N401 Enlarged prostate with lower urinary tract symptoms: Secondary | ICD-10-CM | POA: Diagnosis not present

## 2020-09-13 DIAGNOSIS — M1990 Unspecified osteoarthritis, unspecified site: Secondary | ICD-10-CM | POA: Diagnosis not present

## 2020-09-19 DIAGNOSIS — J4520 Mild intermittent asthma, uncomplicated: Secondary | ICD-10-CM | POA: Diagnosis not present

## 2020-09-19 DIAGNOSIS — E785 Hyperlipidemia, unspecified: Secondary | ICD-10-CM | POA: Diagnosis not present

## 2020-09-19 DIAGNOSIS — Z96641 Presence of right artificial hip joint: Secondary | ICD-10-CM | POA: Diagnosis not present

## 2020-09-19 DIAGNOSIS — I10 Essential (primary) hypertension: Secondary | ICD-10-CM | POA: Diagnosis not present

## 2020-09-19 DIAGNOSIS — Z471 Aftercare following joint replacement surgery: Secondary | ICD-10-CM | POA: Diagnosis not present

## 2020-09-19 DIAGNOSIS — E559 Vitamin D deficiency, unspecified: Secondary | ICD-10-CM | POA: Diagnosis not present

## 2020-09-21 DIAGNOSIS — Z96641 Presence of right artificial hip joint: Secondary | ICD-10-CM | POA: Diagnosis not present

## 2020-09-24 ENCOUNTER — Other Ambulatory Visit: Payer: Self-pay | Admitting: Family Medicine

## 2020-09-24 DIAGNOSIS — R350 Frequency of micturition: Secondary | ICD-10-CM

## 2020-09-24 DIAGNOSIS — N5201 Erectile dysfunction due to arterial insufficiency: Secondary | ICD-10-CM

## 2020-09-24 DIAGNOSIS — N401 Enlarged prostate with lower urinary tract symptoms: Secondary | ICD-10-CM

## 2020-09-24 MED ORDER — SILDENAFIL CITRATE 20 MG PO TABS
ORAL_TABLET | ORAL | 1 refills | Status: DC
Start: 2020-09-24 — End: 2020-12-17

## 2020-09-26 DIAGNOSIS — J4520 Mild intermittent asthma, uncomplicated: Secondary | ICD-10-CM | POA: Diagnosis not present

## 2020-09-26 DIAGNOSIS — Z96641 Presence of right artificial hip joint: Secondary | ICD-10-CM | POA: Diagnosis not present

## 2020-09-26 DIAGNOSIS — E559 Vitamin D deficiency, unspecified: Secondary | ICD-10-CM | POA: Diagnosis not present

## 2020-09-26 DIAGNOSIS — I10 Essential (primary) hypertension: Secondary | ICD-10-CM | POA: Diagnosis not present

## 2020-09-26 DIAGNOSIS — Z471 Aftercare following joint replacement surgery: Secondary | ICD-10-CM | POA: Diagnosis not present

## 2020-09-26 DIAGNOSIS — E785 Hyperlipidemia, unspecified: Secondary | ICD-10-CM | POA: Diagnosis not present

## 2020-10-01 DIAGNOSIS — E785 Hyperlipidemia, unspecified: Secondary | ICD-10-CM | POA: Diagnosis not present

## 2020-10-01 DIAGNOSIS — Z96641 Presence of right artificial hip joint: Secondary | ICD-10-CM | POA: Diagnosis not present

## 2020-10-01 DIAGNOSIS — E559 Vitamin D deficiency, unspecified: Secondary | ICD-10-CM | POA: Diagnosis not present

## 2020-10-01 DIAGNOSIS — I10 Essential (primary) hypertension: Secondary | ICD-10-CM | POA: Diagnosis not present

## 2020-10-01 DIAGNOSIS — Z471 Aftercare following joint replacement surgery: Secondary | ICD-10-CM | POA: Diagnosis not present

## 2020-10-01 DIAGNOSIS — J4520 Mild intermittent asthma, uncomplicated: Secondary | ICD-10-CM | POA: Diagnosis not present

## 2020-10-03 ENCOUNTER — Other Ambulatory Visit: Payer: Medicare Other

## 2020-10-08 DIAGNOSIS — I10 Essential (primary) hypertension: Secondary | ICD-10-CM | POA: Diagnosis not present

## 2020-10-08 DIAGNOSIS — Z96641 Presence of right artificial hip joint: Secondary | ICD-10-CM | POA: Diagnosis not present

## 2020-10-08 DIAGNOSIS — E559 Vitamin D deficiency, unspecified: Secondary | ICD-10-CM | POA: Diagnosis not present

## 2020-10-08 DIAGNOSIS — E785 Hyperlipidemia, unspecified: Secondary | ICD-10-CM | POA: Diagnosis not present

## 2020-10-08 DIAGNOSIS — J4520 Mild intermittent asthma, uncomplicated: Secondary | ICD-10-CM | POA: Diagnosis not present

## 2020-10-08 DIAGNOSIS — Z471 Aftercare following joint replacement surgery: Secondary | ICD-10-CM | POA: Diagnosis not present

## 2020-10-10 ENCOUNTER — Encounter: Payer: Medicare Other | Admitting: Family Medicine

## 2020-10-12 ENCOUNTER — Other Ambulatory Visit: Payer: Self-pay

## 2020-10-12 DIAGNOSIS — R351 Nocturia: Secondary | ICD-10-CM

## 2020-10-12 DIAGNOSIS — R7309 Other abnormal glucose: Secondary | ICD-10-CM

## 2020-10-12 DIAGNOSIS — I10 Essential (primary) hypertension: Secondary | ICD-10-CM

## 2020-10-15 ENCOUNTER — Other Ambulatory Visit: Payer: Self-pay

## 2020-10-15 ENCOUNTER — Other Ambulatory Visit: Payer: Medicare Other

## 2020-10-15 DIAGNOSIS — I10 Essential (primary) hypertension: Secondary | ICD-10-CM | POA: Diagnosis not present

## 2020-10-15 DIAGNOSIS — R351 Nocturia: Secondary | ICD-10-CM | POA: Diagnosis not present

## 2020-10-15 DIAGNOSIS — R7309 Other abnormal glucose: Secondary | ICD-10-CM | POA: Diagnosis not present

## 2020-10-16 ENCOUNTER — Encounter: Payer: Medicare Other | Admitting: Family Medicine

## 2020-10-16 LAB — CBC WITH DIFFERENTIAL/PLATELET
Absolute Monocytes: 488 cells/uL (ref 200–950)
Basophils Absolute: 51 cells/uL (ref 0–200)
Basophils Relative: 1.1 %
Eosinophils Absolute: 189 cells/uL (ref 15–500)
Eosinophils Relative: 4.1 %
HCT: 41.3 % (ref 38.5–50.0)
Hemoglobin: 13.8 g/dL (ref 13.2–17.1)
Lymphs Abs: 2286 cells/uL (ref 850–3900)
MCH: 30.9 pg (ref 27.0–33.0)
MCHC: 33.4 g/dL (ref 32.0–36.0)
MCV: 92.4 fL (ref 80.0–100.0)
MPV: 8.6 fL (ref 7.5–12.5)
Monocytes Relative: 10.6 %
Neutro Abs: 1587 cells/uL (ref 1500–7800)
Neutrophils Relative %: 34.5 %
Platelets: 274 10*3/uL (ref 140–400)
RBC: 4.47 10*6/uL (ref 4.20–5.80)
RDW: 12 % (ref 11.0–15.0)
Total Lymphocyte: 49.7 %
WBC: 4.6 10*3/uL (ref 3.8–10.8)

## 2020-10-16 LAB — COMPLETE METABOLIC PANEL WITH GFR
AG Ratio: 1.6 (calc) (ref 1.0–2.5)
ALT: 12 U/L (ref 9–46)
AST: 15 U/L (ref 10–35)
Albumin: 4.2 g/dL (ref 3.6–5.1)
Alkaline phosphatase (APISO): 61 U/L (ref 35–144)
BUN: 17 mg/dL (ref 7–25)
CO2: 27 mmol/L (ref 20–32)
Calcium: 8.9 mg/dL (ref 8.6–10.3)
Chloride: 104 mmol/L (ref 98–110)
Creat: 0.97 mg/dL (ref 0.70–1.35)
Globulin: 2.7 g/dL (calc) (ref 1.9–3.7)
Glucose, Bld: 106 mg/dL — ABNORMAL HIGH (ref 65–99)
Potassium: 4.1 mmol/L (ref 3.5–5.3)
Sodium: 139 mmol/L (ref 135–146)
Total Bilirubin: 0.6 mg/dL (ref 0.2–1.2)
Total Protein: 6.9 g/dL (ref 6.1–8.1)
eGFR: 85 mL/min/{1.73_m2} (ref >=60)

## 2020-10-16 LAB — LIPID PANEL
Cholesterol: 151 mg/dL (ref <200)
HDL: 48 mg/dL (ref >=40)
LDL Cholesterol (Calc): 88 mg/dL (calc)
Non-HDL Cholesterol (Calc): 103 mg/dL (calc) (ref <130)
Total CHOL/HDL Ratio: 3.1 (calc) (ref <5.0)
Triglycerides: 64 mg/dL (ref <150)

## 2020-10-16 LAB — HEMOGLOBIN A1C
Hgb A1c MFr Bld: 5.5 % of total Hgb (ref <5.7)
Mean Plasma Glucose: 111 mg/dL
eAG (mmol/L): 6.2 mmol/L

## 2020-10-16 LAB — PSA: PSA: 0.21 ng/mL (ref <=4.00)

## 2020-10-19 ENCOUNTER — Ambulatory Visit (INDEPENDENT_AMBULATORY_CARE_PROVIDER_SITE_OTHER): Payer: Medicare Other | Admitting: Family Medicine

## 2020-10-19 ENCOUNTER — Other Ambulatory Visit: Payer: Self-pay

## 2020-10-19 ENCOUNTER — Encounter: Payer: Self-pay | Admitting: Family Medicine

## 2020-10-19 VITALS — BP 129/61 | HR 66 | Ht 68.5 in | Wt 220.0 lb

## 2020-10-19 DIAGNOSIS — I10 Essential (primary) hypertension: Secondary | ICD-10-CM

## 2020-10-19 DIAGNOSIS — Z0000 Encounter for general adult medical examination without abnormal findings: Secondary | ICD-10-CM | POA: Diagnosis not present

## 2020-10-19 DIAGNOSIS — M62838 Other muscle spasm: Secondary | ICD-10-CM | POA: Diagnosis not present

## 2020-10-19 DIAGNOSIS — Z23 Encounter for immunization: Secondary | ICD-10-CM | POA: Diagnosis not present

## 2020-10-19 DIAGNOSIS — G2581 Restless legs syndrome: Secondary | ICD-10-CM

## 2020-10-19 DIAGNOSIS — E7849 Other hyperlipidemia: Secondary | ICD-10-CM

## 2020-10-19 DIAGNOSIS — K219 Gastro-esophageal reflux disease without esophagitis: Secondary | ICD-10-CM | POA: Diagnosis not present

## 2020-10-19 MED ORDER — ROSUVASTATIN CALCIUM 20 MG PO TABS: 20.0000 mg | ORAL_TABLET | Freq: Every day | ORAL | 3 refills | Status: DC

## 2020-10-19 MED ORDER — SHINGRIX 50 MCG/0.5ML IM SUSR: INTRAMUSCULAR | 1 refills | Status: DC

## 2020-10-19 MED ORDER — OMEPRAZOLE 20 MG PO CPDR: 20.0000 mg | DELAYED_RELEASE_CAPSULE | Freq: Every day | ORAL | 3 refills | Status: DC

## 2020-10-19 MED ORDER — METHOCARBAMOL 500 MG PO TABS: 500.0000 mg | ORAL_TABLET | Freq: Three times a day (TID) | ORAL | 3 refills | Status: DC | PRN

## 2020-10-19 MED ORDER — ROPINIROLE HCL 2 MG PO TABS: 2.0000 mg | ORAL_TABLET | Freq: Every day | ORAL | 3 refills | Status: DC

## 2020-10-19 MED ORDER — HYDROCHLOROTHIAZIDE 12.5 MG PO TABS: 12.5000 mg | ORAL_TABLET | Freq: Every day | ORAL | 3 refills | Status: DC

## 2020-10-19 NOTE — Assessment & Plan Note (Signed)
Controlled on PPI Re order Omeprazole

## 2020-10-19 NOTE — Assessment & Plan Note (Signed)
Controlled cholesterol on statin lifestyle The 10-year ASCVD risk score (Arnett DK, et al., 2019) is: 17.5%  Plan: 1. Continue current meds - Rosuvastatin 20mg  2. Encourage improved lifestyle - low carb/cholesterol, reduce portion size, continue improving regular exercise

## 2020-10-19 NOTE — Progress Notes (Signed)
Subjective:    Patient ID: Curtis Gregory, male    DOB: [birth year], 70 y.o.   MRN: [id number]  Curtis Gregory is a 70 y.o. male presenting on 10/19/2020 for Annual Exam   HPI  Here for Annual Physical and Lab Review  CHRONIC HTN: Doing well. BP controlled. Current Meds - HCTZ 12.5mg  daily needs refill authorization Reports good compliance, took meds today. Tolerating well, w/o complaints. Denies CP, dyspnea, HA, edema, dizziness / lightheadedness   HYPERLIPIDEMIA: - Reports no concerns. Last lipid panel 2020, controlled due for repeat panel - Currently taking Rosuvastatin 20mg , tolerating well without side effects or myalgias  GERD Continues on Omeprazole 20mg  daily  Upcoming Dr Rudene Christians on Monday S/p Total R Hip Arthroplasty Still has aches and pains knees etc  Left arm Pain Worse in morning, mostly shoulder / bicep  RLS improved on Ropinirole 1mg  but would like to try inc dose.  Health Maintenance:  Due for Flu Shot, will receive today  UTD on COVID Booster bivalent  Colonoscopy done 2018. Next in 5 years or 2023.  Depression screen [facility] 2/9 07/04/2020 01/24/2020 03/15/2019  Decreased Interest 0 0 0  Down, Depressed, Hopeless 1 0 0  PHQ - 2 Score 1 0 0  Altered sleeping 1 - -  Tired, decreased energy 0 - -  Change in appetite 0 - -  Feeling bad or failure about yourself  1 - -  Trouble concentrating 0 - -  Moving slowly or fidgety/restless 0 - -  Suicidal thoughts 0 - -  PHQ-9 Score 3 - -  Difficult doing work/chores Not difficult at all - -    Past Medical History:  Diagnosis Date   Asthma    Benign prostatic hypertrophy with urinary frequency 06/16/2014   Excessive urination at night 06/16/2014   GERD (gastroesophageal reflux disease)    Glaucoma    Hyperlipemia    Hypertension    Internal and external thrombosed hemorrhoids 03/15/2019   Sleep apnea    Past Surgical History:  Procedure Laterality Date   BOTOX INJECTION N/A 08/05/2019   Procedure: BOTOX  INJECTION;  Surgeon: Ronny Bacon, MD;  Location: [facility];  Service: General;  Laterality: N/A;   COLONOSCOPY WITH PROPOFOL N/A 09/11/2016   Procedure: COLONOSCOPY WITH PROPOFOL;  Surgeon: Lucilla Lame, MD;  Location: [place];  Service: Gastroenterology;  Laterality: N/A;  sleep apnea   HEMORRHOID SURGERY N/A 03/30/2019   Procedure: HEMORRHOIDECTOMY;  Surgeon: Ronny Bacon, MD;  Location: [facility];  Service: General;  Laterality: N/A;   none     POLYPECTOMY N/A 09/11/2016   Procedure: POLYPECTOMY;  Surgeon: Lucilla Lame, MD;  Location: [place];  Service: Gastroenterology;  Laterality: N/A;   SPHINCTEROTOMY N/A 08/05/2019   Procedure: SPHINCTEROTOMY, Chemical anal;  Surgeon: Ronny Bacon, MD;  Location: [facility];  Service: General;  Laterality: N/A;   TOTAL HIP ARTHROPLASTY Right 08/09/2020   Procedure: TOTAL HIP ARTHROPLASTY ANTERIOR APPROACH;  Surgeon: Hessie Knows, MD;  Location: [facility];  Service: Orthopedics;  Laterality: Right;   Social History   Socioeconomic History   Marital status: Married    Spouse name: Not on file   Number of children: Not on file   Years of education: Not on file   Highest education level: Master's degree (e.g., MA, MS, MEng, MEd, MSW, MBA)  Occupational History   Occupation: retired  Tobacco Use   Smoking status: Never   Smokeless tobacco: Never  Vaping Use   Vaping Use: Never  used  Substance and Sexual Activity   Alcohol use: Yes    Alcohol/week: 1.0 standard drink    Types: 1 Glasses of wine per week    Comment: occasional   Drug use: No   Sexual activity: Not Currently  Other Topics Concern   Not on file  Social History Narrative   Not on file   Social Determinants of Health   Financial Resource Strain: Low Risk    Difficulty of Paying Living Expenses: Not hard at all  Food Insecurity: No Food Insecurity   Worried About Charity fundraiser in the Last Year: Never true   [place] in the Last Year:  Never true  Transportation Needs: No Transportation Needs   Lack of Transportation (Medical): No   Lack of Transportation (Non-Medical): No  Physical Activity: Insufficiently Active   Days of Exercise per Week: 3 days   Minutes of Exercise per Session: 30 min  Stress: No Stress Concern Present   Feeling of Stress : Not at all  Social Connections: Not on file  Intimate Partner Violence: Not on file   Family History  Problem Relation Age of Onset   Diabetes Mother    Heart disease Mother    Alzheimer's disease Father    Prostate cancer Father    Prostate cancer Brother    Kidney cancer Neg Hx    Current Outpatient Medications on File Prior to Visit  Medication Sig   albuterol (PROVENTIL HFA;VENTOLIN HFA) 108 (90 BASE) MCG/ACT inhaler Inhale 1-2 puffs into the lungs every 6 (six) hours as needed for wheezing or shortness of breath. Reported on 06/04/2015   Cholecalciferol (VITAMIN D3) 50 MCG (2000 UT) capsule Take 2,000 Units by mouth daily.   fexofenadine (ALLEGRA) 180 MG tablet TAKE 1 TABLET BY MOUTH EVERY DAY (Patient taking differently: Take 180 mg by mouth daily as needed for allergies.)   finasteride (PROSCAR) 5 MG tablet Take 1 tablet (5 mg total) by mouth daily.   fluticasone (FLONASE) 50 MCG/ACT nasal spray Place 2 sprays into both nostrils daily. Use for 4-6 weeks then stop and use seasonally or as needed. (Patient taking differently: Place 2 sprays into both nostrils daily as needed for rhinitis.)   latanoprost (XALATAN) 0.005 % ophthalmic solution Place 1 drop into both eyes at bedtime.    sildenafil (REVATIO) 20 MG tablet TAKE TWO TO FIVE TABLETS BY MOUTH ONE HOUR PRIOR TO INTERCOURSE   tamsulosin (FLOMAX) 0.4 MG CAPS capsule Take 1 capsule (0.4 mg total) by mouth daily after breakfast.   timolol (TIMOPTIC-XR) 0.5 % ophthalmic gel-forming Place 1 drop into the left eye at bedtime.    No current facility-administered medications on file prior to visit.    Review of Systems   Constitutional:  Negative for activity change, appetite change, chills, diaphoresis, fatigue and fever.  HENT:  Negative for congestion and hearing loss.   Eyes:  Negative for visual disturbance.  Respiratory:  Negative for cough, chest tightness, shortness of breath and wheezing.   Cardiovascular:  Negative for chest pain, palpitations and leg swelling.  Gastrointestinal:  Negative for abdominal pain, constipation, diarrhea, nausea and vomiting.  Genitourinary:  Negative for difficulty urinating, dysuria, frequency and hematuria.  Musculoskeletal:  Positive for arthralgias. Negative for neck pain.  Skin:  Negative for rash.  Neurological:  Negative for dizziness, weakness, light-headedness, numbness and headaches.  Hematological:  Negative for adenopathy.  Psychiatric/Behavioral:  Negative for behavioral problems, dysphoric mood and sleep disturbance.   Per  HPI unless specifically indicated above     Objective:    BP 129/61   Pulse 66   Ht 5' 8.5" (1.74 m)   Wt 220 lb (99.8 kg)   SpO2 100%   BMI 32.96 kg/m   Wt Readings from Last 3 Encounters:  10/19/20 220 lb (99.8 kg)  08/17/20 220 lb (99.8 kg)  08/09/20 220 lb (99.8 kg)    Physical Exam Vitals and nursing note reviewed.  Constitutional:      General: He is not in acute distress.    Appearance: He is well-developed. He is not diaphoretic.     Comments: Well-appearing, comfortable, cooperative  HENT:     Head: Normocephalic and atraumatic.  Eyes:     General:        Right eye: No discharge.        Left eye: No discharge.     Conjunctiva/sclera: Conjunctivae normal.     Pupils: Pupils are equal, round, and reactive to light.  Neck:     Thyroid: No thyromegaly.  Cardiovascular:     Rate and Rhythm: Normal rate and regular rhythm.     Pulses: Normal pulses.     Heart sounds: Normal heart sounds. No murmur heard. Pulmonary:     Effort: Pulmonary effort is normal. No respiratory distress.     Breath sounds: Normal  breath sounds. No wheezing or rales.  Abdominal:     General: Bowel sounds are normal. There is no distension.     Palpations: Abdomen is soft. There is no mass.     Tenderness: There is no abdominal tenderness.  Musculoskeletal:        General: No tenderness. Normal range of motion.     Cervical back: Normal range of motion and neck supple.     Right lower leg: No edema.     Left lower leg: No edema.     Comments: Upper / Lower Extremities: - Normal muscle tone, strength bilateral upper extremities 5/5, lower extremities 5/5  Lymphadenopathy:     Cervical: No cervical adenopathy.  Skin:    General: Skin is warm and dry.     Findings: No erythema or rash.  Neurological:     Mental Status: He is alert and oriented to person, place, and time.     Comments: Distal sensation intact to light touch all extremities  Psychiatric:        Mood and Affect: Mood normal.        Behavior: Behavior normal.        Thought Content: Thought content normal.     Comments: Well groomed, good eye contact, normal speech and thoughts     Results for orders placed or performed in visit on 10/12/20  PSA  Result Value Ref Range   PSA 0.21 < OR = 4.00 ng/mL  Hemoglobin A1c  Result Value Ref Range   Hgb A1c MFr Bld 5.5 <5.7 % of total Hgb   Mean Plasma Glucose 111 mg/dL   eAG (mmol/L) 6.2 mmol/L  Lipid panel  Result Value Ref Range   Cholesterol 151 <200 mg/dL   HDL 48 > OR = 40 mg/dL   Triglycerides 64 <150 mg/dL   LDL Cholesterol (Calc) 88 mg/dL (calc)   Total CHOL/HDL Ratio 3.1 <5.0 (calc)   Non-HDL Cholesterol (Calc) 103 <130 mg/dL (calc)  CBC with Differential/Platelet  Result Value Ref Range   WBC 4.6 3.8 - 10.8 Thousand/uL   RBC 4.47 4.20 - 5.80 Million/uL   Hemoglobin  13.8 13.2 - 17.1 g/dL   HCT 41.3 38.5 - 50.0 %   MCV 92.4 80.0 - 100.0 fL   MCH 30.9 27.0 - 33.0 pg   MCHC 33.4 32.0 - 36.0 g/dL   RDW 12.0 11.0 - 15.0 %   Platelets 274 140 - 400 Thousand/uL   MPV 8.6 7.5 - 12.5 fL    Neutro Abs 1,587 1,500 - 7,800 cells/uL   Lymphs Abs 2,286 850 - 3,900 cells/uL   Absolute Monocytes 488 200 - 950 cells/uL   Eosinophils Absolute 189 15 - 500 cells/uL   Basophils Absolute 51 0 - 200 cells/uL   Neutrophils Relative % 34.5 %   Total Lymphocyte 49.7 %   Monocytes Relative 10.6 %   Eosinophils Relative 4.1 %   Basophils Relative 1.1 %  COMPLETE METABOLIC PANEL WITH GFR  Result Value Ref Range   Glucose, Bld 106 (H) 65 - 99 mg/dL   BUN 17 7 - 25 mg/dL   Creat 0.97 0.70 - 1.35 mg/dL   eGFR 85 > OR = 60 mL/min/1.23m2   BUN/Creatinine Ratio NOT APPLICABLE 6 - 22 (calc)   Sodium 139 135 - 146 mmol/L   Potassium 4.1 3.5 - 5.3 mmol/L   Chloride 104 98 - 110 mmol/L   CO2 27 20 - 32 mmol/L   Calcium 8.9 8.6 - 10.3 mg/dL   Total Protein 6.9 6.1 - 8.1 g/dL   Albumin 4.2 3.6 - 5.1 g/dL   Globulin 2.7 1.9 - 3.7 g/dL (calc)   AG Ratio 1.6 1.0 - 2.5 (calc)   Total Bilirubin 0.6 0.2 - 1.2 mg/dL   Alkaline phosphatase (APISO) 61 35 - 144 U/L   AST 15 10 - 35 U/L   ALT 12 9 - 46 U/L      Assessment & Plan:   Problem List Items Addressed This Visit     Restless leg syndrome    Improved on Ropinirole Dose inc today to Ropinirole $RemoveBefor'2mg'[id number]$  nightly      Relevant Medications   rOPINIRole (REQUIP) 2 MG tablet   Hyperlipidemia    Controlled cholesterol on statin lifestyle The 10-year ASCVD risk score (Arnett DK, et al., 2019) is: 17.5%  Plan: 1. Continue current meds - Rosuvastatin $RemoveBefore'20mg'[id number]$  2. Encourage improved lifestyle - low carb/cholesterol, reduce portion size, continue improving regular exercise      Relevant Medications   hydrochlorothiazide (HYDRODIURIL) 12.5 MG tablet   rosuvastatin (CRESTOR) 20 MG tablet   GERD (gastroesophageal reflux disease)    Controlled on PPI Re order Omeprazole      Relevant Medications   omeprazole (PRILOSEC) 20 MG capsule   Essential hypertension    Well-controlled HTN - Home BP readings reviewed  No known complications    Plan:   1.  Continue current BP regimen - HCTZ 12.$RemoveB'5mg'Sgnbskio$  daily, add refills 2. Encourage improved lifestyle - low sodium diet, regular exercise 3. Continue monitor BP outside office, bring readings to next visit, if persistently >140/90 or new symptoms notify office sooner      Relevant Medications   hydrochlorothiazide (HYDRODIURIL) 12.5 MG tablet   rosuvastatin (CRESTOR) 20 MG tablet   Other Visit Diagnoses     Annual physical exam    -  Primary   Muscle spasm       Relevant Medications   methocarbamol (ROBAXIN) 500 MG tablet   Needs flu shot       Relevant Orders   Flu Vaccine QUAD High Dose(Fluad) (Completed)   Need for shingles  vaccine       Relevant Medications   SHINGRIX injection       Updated Health Maintenance information Flu Shot today Printed Shingrix Vaccine given to patient to take to pharmacy when ready Updated COVID booster Reviewed recent lab results with patient Encouraged improvement to lifestyle with diet and exercise Goal of weight loss     Meds ordered this encounter  Medications   omeprazole (PRILOSEC) 20 MG capsule    Sig: Take 1 capsule (20 mg total) by mouth daily before breakfast.    Dispense:  90 capsule    Refill:  3    DX Code Needed  K21.9   hydrochlorothiazide (HYDRODIURIL) 12.5 MG tablet    Sig: Take 1 tablet (12.5 mg total) by mouth daily.    Dispense:  90 tablet    Refill:  3   methocarbamol (ROBAXIN) 500 MG tablet    Sig: Take 1 tablet (500 mg total) by mouth every 8 (eight) hours as needed for muscle spasms.    Dispense:  30 tablet    Refill:  3   rOPINIRole (REQUIP) 2 MG tablet    Sig: Take 1 tablet (2 mg total) by mouth at bedtime.    Dispense:  90 tablet    Refill:  3   rosuvastatin (CRESTOR) 20 MG tablet    Sig: Take 1 tablet (20 mg total) by mouth at bedtime.    Dispense:  90 tablet    Refill:  3   SHINGRIX injection    Sig: Inject 0.5 mL into muscle for shingles vaccine. Repeat dose in 2-6 months.    Dispense:  0.5 mL     Refill:  1    Follow up plan: Return in about 1 year (around 10/19/2021) for 1 year fasting lab only then 1 week later [employer].  [name], [facility] 10/19/2020, 11:17 AM

## 2020-10-19 NOTE — Patient Instructions (Addendum)
Keep up the great work  Meds refilled. For 1 year including methocarbamol muscle relaxant  Flu Shot today  Shingrix vaccine recommended 2 doses 2-6 month apart.  Inc dose Ropinirole from 1 to 2mg  - take one nightly  Please schedule a Follow-up Appointment to: Return in about 1 year (around 10/19/2021) for 1 year fasting lab only then 1 week later [facility].  If you have any other questions or concerns, please feel free to call the office or send a message through [facility]. You may also schedule an earlier appointment if necessary.  Additionally, you may be receiving a survey about your experience at our office within a few days to 1 week by e-mail or mail. We value your feedback.  [name], DO [facility]

## 2020-10-19 NOTE — Assessment & Plan Note (Signed)
Well-controlled HTN - Home BP readings reviewed  No known complications    Plan:  1.  Continue current BP regimen - HCTZ 12.5mg  daily, add refills 2. Encourage improved lifestyle - low sodium diet, regular exercise 3. Continue monitor BP outside office, bring readings to next visit, if persistently >140/90 or new symptoms notify office sooner

## 2020-10-19 NOTE — Assessment & Plan Note (Signed)
Improved on Ropinirole Dose inc today to Ropinirole 2mg  nightly

## 2020-10-26 ENCOUNTER — Telehealth: Payer: Medicare Other | Admitting: Family Medicine

## 2020-10-26 DIAGNOSIS — M898X1 Other specified disorders of bone, shoulder: Secondary | ICD-10-CM

## 2020-10-26 DIAGNOSIS — M79602 Pain in left arm: Secondary | ICD-10-CM | POA: Diagnosis not present

## 2020-10-26 DIAGNOSIS — R200 Anesthesia of skin: Secondary | ICD-10-CM | POA: Diagnosis not present

## 2020-10-26 MED ORDER — MELOXICAM 15 MG PO TABS: ORAL_TABLET | ORAL | 0 refills | Status: DC

## 2020-10-26 NOTE — Patient Instructions (Signed)
I appreciate the opportunity to provide you with care for your health and wellness.  Take medication as ordered  Have a nice trip!  Please continue to practice social distancing to keep you, your family, and our community safe.  If you must go out, please wear a mask and practice good handwashing.  Have a wonderful day. With Gratitude, Cherly Beach, DNP, AGNP-BC    Cervical Radiculopathy Cervical radiculopathy means that a nerve in the neck (a cervical nerve) is pinched or bruised. This can happen because of an injury to the cervical spine (vertebrae) in the neck, or as a normal part of getting older. This can cause pain or loss of feeling (numbness) that runs from your neck all the way down to your arm and fingers. Often, this condition gets better with rest. Treatment may be needed if the condition does not get better. What are the causes? A neck injury. A bulging disk in your spine. Muscle movements that you cannot control (muscle spasms). Tight muscles in your neck due to overuse. Arthritis. Breakdown in the bones and joints of the spine (spondylosis) due to getting older. Bone spurs that form near the nerves in the neck. What are the signs or symptoms? Pain. The pain may: Run from the neck to the arm and hand. Be very bad or irritating. Be worse when you move your neck. Loss of feeling or tingling in your arm or hand. Weakness in your arm or hand, in very bad cases. How is this treated? In many cases, treatment is not needed for this condition. With rest, the condition often gets better over time. If treatment is needed, options may include: Wearing a soft neck collar (cervical collar) for short periods of time, as told by your doctor. Doing exercises (physical therapy) to strengthen your neck muscles. Taking medicines. Having shots (injections) in your spine, in very bad cases. Having surgery. This may be needed if other treatments do not help. The type of surgery that is  used depends on the cause of your condition. Follow these instructions at home: If you have a soft neck collar: Wear it as told by your doctor. Remove it only as told by your doctor. Ask your doctor if you can remove the collar for cleaning and bathing. If you are allowed to remove the collar for cleaning or bathing: Follow instructions from your doctor about how to remove the collar safely. Clean the collar by wiping it with mild soap and water and drying it completely. Take out any removable pads in the collar every 1-2 days. Wash them by hand with soap and water. Let them air-dry completely before you put them back in the collar. Check your skin under the collar for redness or sores. If you see any, tell your doctor. Managing pain   Take over-the-counter and prescription medicines only as told by your doctor. If told, put ice on the painful area. If you have a soft neck collar, remove it as told by your doctor. Put ice in a plastic bag. Place a towel between your skin and the bag. Leave the ice on for 20 minutes, 2-3 times a day. If using ice does not help, you can try using heat. Use the heat source that your doctor recommends, such as a moist heat pack or a heating pad. Place a towel between your skin and the heat source. Leave the heat on for 20-30 minutes. Remove the heat if your skin turns bright red. This is very important if  you are unable to feel pain, heat, or cold. You may have a greater risk of getting burned. You may try a gentle neck and shoulder rub (massage). Activity Rest as needed. Return to your normal activities as told by your doctor. Ask your doctor what activities are safe for you. Do exercises as told by your doctor or physical therapist. Do not lift anything that is heavier than 10 lb (4.5 kg) until your doctor tells you that it is safe. General instructions Use a flat pillow when you sleep. Do not drive while wearing a soft neck collar. If you do not have a  soft neck collar, ask your doctor if it is safe to drive while your neck heals. Ask your doctor if the medicine prescribed to you requires you to avoid driving or using heavy machinery. Do not use any products that contain nicotine or tobacco, such as cigarettes, e-cigarettes, and chewing tobacco. These can delay healing. If you need help quitting, ask your doctor. Keep all follow-up visits as told by your doctor. This is important. Contact a doctor if: Your condition does not get better with treatment. Get help right away if: Your pain gets worse and is not helped with medicine. You lose feeling or feel weak in your hand, arm, face, or leg. You have a high fever. You have a stiff neck. You cannot control when you poop or pee (have incontinence). You have trouble with walking, balance, or talking. Summary Cervical radiculopathy means that a nerve in the neck is pinched or bruised. A nerve can get pinched from a bulging disk, arthritis, an injury to the neck, or other causes. Symptoms include pain, tingling, or loss of feeling that goes from the neck into the arm or hand. Weakness in your arm or hand can happen in very bad cases. Treatment may include resting, wearing a soft neck collar, and doing exercises. You might need to take medicines for pain. In very bad cases, shots or surgery may be needed. This information is not intended to replace advice given to you by your health care provider. Make sure you discuss any questions you have with your health care provider. Document Revised: 11/20/2017 Document Reviewed: 11/20/2017 Elsevier Patient Education  2022 [employer].

## 2020-10-26 NOTE — Progress Notes (Signed)
Virtual Visit Consent   Heather Streeper, you are scheduled for a virtual visit with a [facility] provider today.     Just as with appointments in the office, your consent must be obtained to participate.  Your consent will be active for this visit and any virtual visit you may have with one of our providers in the next 365 days.     If you have a MyChart account, a copy of this consent can be sent to you electronically.  All virtual visits are billed to your insurance company just like a traditional visit in the office.    As this is a virtual visit, video technology does not allow for your provider to perform a traditional examination.  This may limit your provider's ability to fully assess your condition.  If your provider identifies any concerns that need to be evaluated in person or the need to arrange testing (such as labs, EKG, etc.), we will make arrangements to do so.     Although advances in technology are sophisticated, we cannot ensure that it will always work on either your end or our end.  If the connection with a video visit is poor, the visit may have to be switched to a telephone visit.  With either a video or telephone visit, we are not always able to ensure that we have a secure connection.     I need to obtain your verbal consent now.   Are you willing to proceed with your visit today?    Dorell Gatlin has provided verbal consent on 10/26/2020 for a virtual visit (video or telephone).   Perlie Mayo, NP   Date: 10/26/2020 1:06 PM   Virtual Visit via Video Note   I, Perlie Mayo, connected with  Kamare Caspers  ([id number], 05/19/1950) on 10/26/20 at  1:00 PM EDT by a video-enabled telemedicine application and verified that I am speaking with the correct person using two identifiers.  Location: Patient: Virtual Visit Location Patient: Home Provider: Virtual Visit Location Provider: Home Office   I discussed the limitations of evaluation and management by telemedicine  and the availability of in person appointments. The patient expressed understanding and agreed to proceed.    History of Present Illness: Curtis Gregory is a 70 y.o. who identifies as a male who was assigned male at birth, and is being seen today for shoulder pain per comment. Shoulder blade pain, between elbow and shoulder- left side. Questionable pinch nerve thumb and finger on left side.   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   Family history of prostate cancer 03/16/2020   Erectile dysfunction due to arterial insufficiency 03/16/2020   Anal fissure 07/26/2019   Rectal or anal pain 06/23/2019   Status post hemorrhoidectomy 05/17/2019   Chronic pain of right knee 09/08/2018   Elevated hemoglobin A1c 09/09/2017   Vitamin D deficiency 09/09/2017   Benign neoplasm of descending colon    Seasonal allergies 04/30/2015   Snoring 10/18/2014   Torticollis, acute 10/18/2014  Asthma, mild intermittent 06/16/2014   Hyperlipidemia 06/16/2014   Essential hypertension 06/16/2014   GERD (gastroesophageal reflux disease) 06/16/2014   Pain in the wrist 06/16/2014   Apnea, sleep 06/16/2014   Benign prostatic hyperplasia with urinary frequency 06/16/2014    Allergies: No Known Allergies Medications:  Current Outpatient Medications:    albuterol (PROVENTIL HFA;VENTOLIN HFA) 108 (90 BASE) MCG/ACT inhaler, Inhale 1-2 puffs into the lungs every 6 (six) hours as needed for wheezing or shortness of breath. Reported on  06/04/2015, Disp: , Rfl:    Cholecalciferol (VITAMIN D3) 50 MCG (2000 UT) capsule, Take 2,000 Units by mouth daily., Disp: , Rfl:    fexofenadine (ALLEGRA) 180 MG tablet, TAKE 1 TABLET BY MOUTH EVERY DAY (Patient taking differently: Take 180 mg by mouth daily as needed for allergies.), Disp: 90 tablet, Rfl: 2   finasteride (PROSCAR) 5 MG tablet, Take 1 tablet (5 mg total) by mouth daily., Disp: 90 tablet, Rfl: 3   fluticasone (FLONASE) 50 MCG/ACT nasal spray, Place 2 sprays into both nostrils daily. Use for 4-6 weeks then stop and use seasonally or as needed. (Patient taking differently: Place 2 sprays into both nostrils daily as needed for rhinitis.), Disp: 16 g, Rfl: 3   hydrochlorothiazide (HYDRODIURIL) 12.5 MG tablet, Take 1 tablet (12.5 mg total) by mouth daily., Disp: 90 tablet, Rfl: 3   latanoprost (XALATAN) 0.005 % ophthalmic solution, Place 1 drop into both eyes at bedtime. , Disp: , Rfl: 0   methocarbamol (ROBAXIN) 500 MG tablet, Take 1 tablet (500 mg total) by mouth every 8 (eight) hours as needed for muscle spasms., Disp: 30 tablet, Rfl: 3   omeprazole (PRILOSEC) 20 MG capsule, Take 1 capsule (20 mg total) by mouth daily before breakfast., Disp: 90 capsule, Rfl: 3   rOPINIRole (REQUIP) 2 MG tablet, Take 1 tablet (2 mg total) by mouth at bedtime., Disp: 90 tablet, Rfl: 3   rosuvastatin (CRESTOR) 20 MG tablet, Take 1 tablet (20 mg total) by mouth at bedtime., Disp: 90 tablet, Rfl: 3   SHINGRIX injection, Inject 0.5 mL into muscle for shingles vaccine. Repeat dose in 2-6 months., Disp: 0.5 mL, Rfl: 1   sildenafil (REVATIO) 20 MG tablet, TAKE TWO TO FIVE TABLETS BY MOUTH ONE HOUR PRIOR TO INTERCOURSE, Disp: 30 tablet, Rfl: 1   tamsulosin (FLOMAX) 0.4 MG CAPS capsule, Take 1 capsule (0.4 mg total) by mouth daily after breakfast., Disp: 90 capsule, Rfl: 3   timolol (TIMOPTIC-XR) 0.5 % ophthalmic gel-forming, Place 1 drop into the left eye at bedtime. , Disp: , Rfl:  0  Observations/Objective: Patient is well-developed, well-nourished in no acute distress.  Resting comfortably  at home.  Head is normocephalic, atraumatic.  No labored breathing.  Speech is clear and coherent with logical content.  Patient is alert and oriented at baseline.  Movement of neck, arms, and shoulders in normal range and limits  Assessment and Plan: 1. Pain of left upper extremity Pain located in the upper posterior left arm No injury or trauma, reports hx prior and use of meloxicam with improvement Strength and ROM intact No red flags  2. Shoulder blade pain Pain can radiate in to shoulder blade Questionable traps and or nerve involvement Exercises given to relax traps and neck muscles  3. Numbness of fingers Questionable pinch nerve- not a continuous event- no in person eval needed, but recommend to follow up with PCP for additional care if not improved.    Reviewed side effects, risks and benefits of medication.  Patient acknowledged agreement and understanding of the plan.  I discussed the assessment and treatment plan with the patient. The patient was provided an opportunity to ask questions and all were answered. The patient agreed with the plan and demonstrated an understanding of the instructions.   The patient was advised to call back or seek an in-person evaluation if the symptoms worsen or if the condition fails to improve as anticipated.   The above assessment and management plan was discussed with the patient. The patient verbalized understanding of and has agreed to the management plan. Patient is aware to call the clinic if symptoms persist or worsen. Patient is aware when to return to the clinic for a follow-up visit. Patient educated on when it is appropriate to go to the emergency department.   Follow Up Instructions: I discussed the assessment and treatment plan with the patient. The patient was provided an opportunity to ask questions and all  were answered. The patient agreed with the plan and demonstrated an understanding of the instructions.  A copy of instructions were sent to the patient via MyChart unless otherwise noted below.    The patient was advised to call back or seek an in-person evaluation if the symptoms worsen or if the condition fails to improve as anticipated.  Time:  I spent 10 minutes with the patient via telehealth technology discussing the above problems/concerns.    Perlie Mayo, NP

## 2020-11-02 ENCOUNTER — Ambulatory Visit: Payer: Self-pay | Admitting: *Deleted

## 2020-11-02 NOTE — Telephone Encounter (Signed)
C/o left arm/ hand/ finger N/T, spasms, tremors to shoulder and upper body x 2 days. N/T decreased now . Denies facial numbness, weakness on either side, headache, visual , speech difficulties. Reports stopping ropinirole, and robaxin 2 days ago and no longer has right hip pain and N/T decreased in left hand. Continues to c/o tremors / spasms.  Reports he is going to be scheduled for MRI , no date given . Patient would like to review sx with PCP prior to leaving on vacation out of the country. Please advise if OV needed or if My chart video visit scheduled for 11/05/20 appropriate. Care advise given. Patient verbalized understanding of care advise and to call back or go to [facility] or ED if symptoms worsen.

## 2020-11-02 NOTE — Telephone Encounter (Signed)
Reason for Disposition  [1] Tingling (e.g., pins and needles) of the face, arm / hand, or leg / foot on one side of the body AND [2] present now (Exceptions: chronic/recurrent symptom lasting > 4 weeks or tingling from known cause, such as: bumped elbow, carpal tunnel syndrome, pinched nerve, frostbite)  Answer Assessment - Initial Assessment Questions 1. SYMPTOM: "What is the main symptom you are concerned about?" (e.g., weakness, numbness)     Left hand / arm tremors, N/T spasms shoulder and upper body  2. ONSET: "When did this start?" (minutes, hours, days; while sleeping)     X 2 days ago  3. LAST NORMAL: "When was the last time you (the patient) were normal (no symptoms)?"     2 days ago  4. PATTERN "Does this come and go, or has it been constant since it started?"  "Is it present now?"     Na  5. CARDIAC SYMPTOMS: "Have you had any of the following symptoms: chest pain, difficulty breathing, palpitations?"     Denies  6. NEUROLOGIC SYMPTOMS: "Have you had any of the following symptoms: headache, dizziness, vision loss, double vision, changes in speech, unsteady on your feet?"     Denies  7. OTHER SYMPTOMS: "Do you have any other symptoms?"     Left hand / arm N/T spasms, tremors  8. PREGNANCY: "Is there any chance you are pregnant?" "When was your last menstrual period?"     na  Protocols used: Neurologic Deficit-A-AH

## 2020-11-02 NOTE — Telephone Encounter (Signed)
Could you call patient back? I would not be able to do a verbal consultation with him today but I have reviewed his concerns - his symptoms sound to be very localized and likely with a nerve impingement. I see the Orthopedics has the MRI planned or to be scheduled, hopefully they may identify a cause. Alternatively he may need other imaging such as Neck imaging, and usually if his symptoms are from nerve impingement this may end up requiring the Neurologist instead Jefm Bryant can also do this - he may need [place] to refer him or maybe the Ortho to refer if it turns out to require this)  These symptoms should not be due to the Ropinirole. So it is okay if he prefers to keep taking these meds.  Let me know if other questions  [name], [facility] 11/02/2020, 2:38 PM

## 2020-11-05 ENCOUNTER — Telehealth (INDEPENDENT_AMBULATORY_CARE_PROVIDER_SITE_OTHER): Payer: Medicare Other | Admitting: Family Medicine

## 2020-11-05 ENCOUNTER — Encounter: Payer: Self-pay | Admitting: Family Medicine

## 2020-11-05 VITALS — Ht 68.5 in | Wt 220.0 lb

## 2020-11-05 DIAGNOSIS — G5622 Lesion of ulnar nerve, left upper limb: Secondary | ICD-10-CM

## 2020-11-05 DIAGNOSIS — K219 Gastro-esophageal reflux disease without esophagitis: Secondary | ICD-10-CM | POA: Diagnosis not present

## 2020-11-05 DIAGNOSIS — R202 Paresthesia of skin: Secondary | ICD-10-CM | POA: Diagnosis not present

## 2020-11-05 MED ORDER — GABAPENTIN 100 MG PO CAPS
ORAL_CAPSULE | ORAL | 2 refills | Status: DC
Start: 2020-11-05 — End: 2020-11-29

## 2020-11-05 MED ORDER — PREDNISONE 20 MG PO TABS: ORAL_TABLET | ORAL | 0 refills | Status: DC

## 2020-11-05 MED ORDER — OMEPRAZOLE 40 MG PO CPDR: 40.0000 mg | DELAYED_RELEASE_CAPSULE | Freq: Every day | ORAL | 3 refills | Status: DC

## 2020-11-05 NOTE — Patient Instructions (Addendum)
Thank you for coming to the office today.  Start Gabapentin 100mg  capsules, take at night for 2-3 nights only, and then increase to 2 times a day for a few days, and then may increase to 3 times a day, it may make you drowsy, if helps significantly at night only, then you can increase instead to 3 capsules at night, instead of 3 times a day - In the future if needed, we can significantly increase the dose if tolerated well, some common doses are 300mg  three times a day up to 600mg  three times a day, usually it takes several weeks or months to get to higher doses  Prednisone taper over 7 days for immediate relief - do NOT take ibuprofen/advil aleve with this. May elevate blood pressure / sugar temporarily.  Likely pinched nerve in Shoulder down into arm.  If orthopedics cannot resolve it then we may refer to neurology   Please schedule a Follow-up Appointment to: Return if symptoms worsen or fail to improve.  If you have any other questions or concerns, please feel free to call the office or send a message through [facility]. You may also schedule an earlier appointment if necessary.  Additionally, you may be receiving a survey about your experience at our office within a few days to 1 week by e-mail or mail. We value your feedback.  [name], DO [facility]

## 2020-11-05 NOTE — Progress Notes (Signed)
Subjective:    Patient ID: Curtis Gregory, male    DOB: [birth year], 70 y.o.   MRN: [id number]  Curtis Gregory is a 70 y.o. male presenting on 11/05/2020 for Tremors  Virtual / Telehealth Encounter - Video Visit via [place] The purpose of this virtual visit is to provide medical care while limiting exposure to the novel coronavirus (COVID19) for both patient and office staff.  Consent was obtained for remote visit:  Yes.   Answered questions that patient had about telehealth interaction:  Yes.   I discussed the limitations, risks, security and privacy concerns of performing an evaluation and management service by video/telephone. I also discussed with the patient that there may be a patient responsible charge related to this service. The patient expressed understanding and agreed to proceed.  Patient Location: Home Provider Location: [facility] (Office)  Participants in virtual visit: - Patient: [name] - CMA: Orinda Kenner, CMA - Provider: Dr Parks Ranger   HPI  Left Nerve Impingement Paresthesias LUE  Followed by [facility] for upcoming LEFT Shoulder MRI for chronic shoulder pain and rotator cuff. He was given Methocarbamol 500 up to $Rem'750mg'jTJp$  dose increase and he had tremors. He has stopped Methocarbamol. Tremors improved Also question Ropinirole was causing muscle spasm and pain or symptoms. He has not adjusted this yet. He will consider this next Waiting on Orthopedics He has not been on steroid for this He has not tried gabapentin before. Denies any other extremity nerve impingement.  GERD On PPI omeprazole $RemoveBefore'20mg'xsOymCPzrkyRo$ , not covered by insurance.  Depression screen [facility] 2/9 07/04/2020 01/24/2020 03/15/2019  Decreased Interest 0 0 0  Down, Depressed, Hopeless 1 0 0  PHQ - 2 Score 1 0 0  Altered sleeping 1 - -  Tired, decreased energy 0 - -  Change in appetite 0 - -  Feeling bad or failure about yourself  1 - -  Trouble concentrating 0 - -  Moving  slowly or fidgety/restless 0 - -  Suicidal thoughts 0 - -  PHQ-9 Score 3 - -  Difficult doing work/chores Not difficult at all - -    Social History   Tobacco Use   Smoking status: Never   Smokeless tobacco: Never  Vaping Use   Vaping Use: Never used  Substance Use Topics   Alcohol use: Yes    Alcohol/week: 1.0 standard drink    Types: 1 Glasses of wine per week    Comment: occasional   Drug use: No    Review of Systems Per HPI unless specifically indicated above     Objective:    Ht 5' 8.5" (1.74 m)   Wt 220 lb (99.8 kg)   BMI 32.96 kg/m   Wt Readings from Last 3 Encounters:  11/05/20 220 lb (99.8 kg)  10/19/20 220 lb (99.8 kg)  08/17/20 220 lb (99.8 kg)    Physical Exam  Note examination was completely remotely via video observation objective data only  Gen - well-appearing, no acute distress or apparent pain, comfortable HEENT - eyes appear clear without discharge or redness Heart/Lungs - cannot examine virtually - observed no evidence of coughing or labored breathing. Abd - cannot examine virtually  Skin - face visible today- no rash Neuro - awake, alert, oriented Psych - not anxious appearing   Results for orders placed or performed in visit on 10/12/20  PSA  Result Value Ref Range   PSA 0.21 < OR = 4.00 ng/mL  Hemoglobin A1c  Result Value Ref  Range   Hgb A1c MFr Bld 5.5 <5.7 % of total Hgb   Mean Plasma Glucose 111 mg/dL   eAG (mmol/L) 6.2 mmol/L  Lipid panel  Result Value Ref Range   Cholesterol 151 <200 mg/dL   HDL 48 > OR = 40 mg/dL   Triglycerides 64 <150 mg/dL   LDL Cholesterol (Calc) 88 mg/dL (calc)   Total CHOL/HDL Ratio 3.1 <5.0 (calc)   Non-HDL Cholesterol (Calc) 103 <130 mg/dL (calc)  CBC with Differential/Platelet  Result Value Ref Range   WBC 4.6 3.8 - 10.8 Thousand/uL   RBC 4.47 4.20 - 5.80 Million/uL   Hemoglobin 13.8 13.2 - 17.1 g/dL   HCT 41.3 38.5 - 50.0 %   MCV 92.4 80.0 - 100.0 fL   MCH 30.9 27.0 - 33.0 pg   MCHC 33.4  32.0 - 36.0 g/dL   RDW 12.0 11.0 - 15.0 %   Platelets 274 140 - 400 Thousand/uL   MPV 8.6 7.5 - 12.5 fL   Neutro Abs 1,587 1,500 - 7,800 cells/uL   Lymphs Abs 2,286 850 - 3,900 cells/uL   Absolute Monocytes 488 200 - 950 cells/uL   Eosinophils Absolute 189 15 - 500 cells/uL   Basophils Absolute 51 0 - 200 cells/uL   Neutrophils Relative % 34.5 %   Total Lymphocyte 49.7 %   Monocytes Relative 10.6 %   Eosinophils Relative 4.1 %   Basophils Relative 1.1 %  COMPLETE METABOLIC PANEL WITH GFR  Result Value Ref Range   Glucose, Bld 106 (H) 65 - 99 mg/dL   BUN 17 7 - 25 mg/dL   Creat 0.97 0.70 - 1.35 mg/dL   eGFR 85 > OR = 60 mL/min/1.25m2   BUN/Creatinine Ratio NOT APPLICABLE 6 - 22 (calc)   Sodium 139 135 - 146 mmol/L   Potassium 4.1 3.5 - 5.3 mmol/L   Chloride 104 98 - 110 mmol/L   CO2 27 20 - 32 mmol/L   Calcium 8.9 8.6 - 10.3 mg/dL   Total Protein 6.9 6.1 - 8.1 g/dL   Albumin 4.2 3.6 - 5.1 g/dL   Globulin 2.7 1.9 - 3.7 g/dL (calc)   AG Ratio 1.6 1.0 - 2.5 (calc)   Total Bilirubin 0.6 0.2 - 1.2 mg/dL   Alkaline phosphatase (APISO) 61 35 - 144 U/L   AST 15 10 - 35 U/L   ALT 12 9 - 46 U/L      Assessment & Plan:   Problem List Items Addressed This Visit     Paresthesia of left upper extremity - Primary   Relevant Medications   gabapentin (NEURONTIN) 100 MG capsule   predniSONE (DELTASONE) 20 MG tablet   GERD (gastroesophageal reflux disease)   Relevant Medications   omeprazole (PRILOSEC) 40 MG capsule   Other Visit Diagnoses     Impingement of left ulnar nerve       Relevant Medications   gabapentin (NEURONTIN) 100 MG capsule   predniSONE (DELTASONE) 20 MG tablet       GERD Dose increase Omeprazole from 20 to $Rem'40mg'Vqfw$  for improved results and coverage  Paresthesia LUE Nerve impingement Chronic Shoulder L Rotator cuff Awaiting MRI and Ortho management Discussed likely nerve pinched in shoulder or neck, causing his paresthesias Will start Prednisone taper 7 days  - short term only. Instructions per AVS Add Gabapentin titration / dosing as well can adjust if need for now to help nerve symptoms May need Neurologist in near future if Ortho cannot resolve  Meds ordered this  encounter  Medications   gabapentin (NEURONTIN) 100 MG capsule    Sig: Start 1 capsule daily, increase by 1 cap every 2-3 days as tolerated up to 3 times a day, or may take 3 at once in evening.    Dispense:  90 capsule    Refill:  2   predniSONE (DELTASONE) 20 MG tablet    Sig: Take daily with food. Start with $RemoveBe'60mg'vGoaHfNwz$  (3 pills) x 2 days, then reduce to $RemoveB'40mg'QOrIGiCI$  (2 pills) x 2 days, then $RemoveBe'20mg'SIGtYNcnr$  (1 pill) x 3 days    Dispense:  13 tablet    Refill:  0   omeprazole (PRILOSEC) 40 MG capsule    Sig: Take 1 capsule (40 mg total) by mouth daily before breakfast.    Dispense:  90 capsule    Refill:  3       Follow up plan: Return if symptoms worsen or fail to improve.   Patient verbalizes understanding with the above medical recommendations including the limitation of remote medical advice.  Specific follow-up and call-back criteria were given for patient to follow-up or seek medical care more urgently if needed.  Total duration of direct patient care provided via video conference: 15 minutes   [name], [facility] 11/05/2020, 11:47 AM

## 2020-11-06 ENCOUNTER — Other Ambulatory Visit: Payer: Self-pay | Admitting: Orthopedic Surgery

## 2020-11-06 ENCOUNTER — Other Ambulatory Visit: Payer: Self-pay | Admitting: Family Medicine

## 2020-11-06 DIAGNOSIS — J4520 Mild intermittent asthma, uncomplicated: Secondary | ICD-10-CM

## 2020-11-06 DIAGNOSIS — M75102 Unspecified rotator cuff tear or rupture of left shoulder, not specified as traumatic: Secondary | ICD-10-CM

## 2020-11-06 DIAGNOSIS — Z20822 Contact with and (suspected) exposure to covid-19: Secondary | ICD-10-CM | POA: Diagnosis not present

## 2020-11-06 MED ORDER — ALBUTEROL SULFATE HFA 108 (90 BASE) MCG/ACT IN AERS: 1.0000 | INHALATION_SPRAY | Freq: Four times a day (QID) | RESPIRATORY_TRACT | 3 refills | Status: DC | PRN

## 2020-11-06 NOTE — Telephone Encounter (Signed)
Requested medication (s) are due for refill today:   This is an old Rx from 82  Requested medication (s) are on the active medication list:   Yes as a historical med.  Future visit scheduled:   Yes  Seen yesterday   Last ordered: 12/22/2013 by a historical provider  Returned because this Rx is expired from 2015.   Requested Prescriptions  Pending Prescriptions Disp Refills   albuterol (VENTOLIN HFA) 108 (90 Base) MCG/ACT inhaler      Sig: Inhale 1-2 puffs into the lungs every 6 (six) hours as needed for wheezing or shortness of breath. Reported on 06/04/2015     Pulmonology:  Beta Agonists Failed - 11/06/2020 11:47 AM      Failed - One inhaler should last at least one month. If the patient is requesting refills earlier, contact the patient to check for uncontrolled symptoms.      Passed - Valid encounter within last 12 months    Recent Outpatient Visits           Yesterday Paresthesia of left upper extremity   [place], DO   2 weeks ago Annual physical exam   [place], DO   2 months ago Restless leg syndrome   [place], DO   4 months ago Essential hypertension   [place], DO   7 months ago Trochanteric bursitis of right hip   [place], DO       Future Appointments             In 2 months  [facility], [place]   In 4 months [place], Ronda Fairly, [place]

## 2020-11-06 NOTE — Telephone Encounter (Signed)
Copied from [place] [id number]. Topic: Quick Communication - Rx Refill/Question >> Nov 06, 2020 11:22 AM Leward Quan A wrote: Medication: albuterol (PROVENTIL HFA;VENTOLIN HFA) 108 (90 BASE) MCG/ACT inhaler  Going out of town and want to have it in case of anything. Seen on 11/05/20  Has the patient contacted their pharmacy? No. Did not call since its been a while since last refill (Agent: If no, request that the patient contact the pharmacy for the refill. If patient does not wish to contact the pharmacy document the reason why and proceed with request.) (Agent: If yes, when and what did the pharmacy advise?)  Preferred Pharmacy (with phone number or street name): [id number] [place] [address]  Phone:  [phone] Fax:  [phone]    Has the patient been seen for an appointment in the last year OR does the patient have an upcoming appointment? Yes.    Agent: Please be advised that RX refills may take up to 3 business days. We ask that you follow-up with your pharmacy.

## 2020-11-19 ENCOUNTER — Ambulatory Visit
Admission: RE | Admit: 2020-11-19 | Discharge: 2020-11-19 | Disposition: A | Discharge status: Home / Self Care | Payer: Medicare Other | Source: Ambulatory Visit | Attending: Orthopedic Surgery | Admitting: Orthopedic Surgery

## 2020-11-19 ENCOUNTER — Other Ambulatory Visit: Payer: Self-pay

## 2020-11-19 DIAGNOSIS — M75102 Unspecified rotator cuff tear or rupture of left shoulder, not specified as traumatic: Secondary | ICD-10-CM | POA: Diagnosis not present

## 2020-11-19 DIAGNOSIS — M25512 Pain in left shoulder: Secondary | ICD-10-CM | POA: Diagnosis not present

## 2020-11-19 DIAGNOSIS — R200 Anesthesia of skin: Secondary | ICD-10-CM | POA: Diagnosis not present

## 2020-11-23 DIAGNOSIS — U071 COVID-19: Secondary | ICD-10-CM | POA: Diagnosis not present

## 2020-11-25 ENCOUNTER — Other Ambulatory Visit: Payer: Self-pay | Admitting: Family Medicine

## 2020-11-25 DIAGNOSIS — G2581 Restless legs syndrome: Secondary | ICD-10-CM

## 2020-11-26 ENCOUNTER — Ambulatory Visit: Payer: Self-pay

## 2020-11-26 NOTE — Telephone Encounter (Signed)
This was refused because it was discontinued on 10/19/2020.   Dose changed to 2 mg.  Requested Prescriptions  Pending Prescriptions Disp Refills  . rOPINIRole (REQUIP) 1 MG tablet [Pharmacy Med Name: ROPINIROLE HCL 1 MG TABLET] 90 tablet     Sig: TAKE 1 TABLET (1 MG TOTAL) BY MOUTH AT BEDTIME. START WITH HALF TABLET 0.5 NIGHTLY AND GRADUALLY INCREASE TO 1 TABLET AS TOLERATED     Neurology:  Parkinsonian Agents Passed - 11/25/2020  2:09 PM      Passed - Last BP in normal range    BP Readings from Last 1 Encounters:  10/19/20 129/61         Passed - Valid encounter within last 12 months    Recent Outpatient Visits          3 weeks ago Paresthesia of left upper extremity   [place], DO   1 month ago Annual physical exam   [place], DO   3 months ago Restless leg syndrome   [place], DO   4 months ago Essential hypertension   [place], DO   7 months ago Trochanteric bursitis of right hip   [place], DO      Future Appointments            In 2 months  [facility], [place]   In 3 months [place], [facility] Fairly, [place]

## 2020-11-26 NOTE — Telephone Encounter (Signed)
Pt. Has had left shoulder pain and tingling in arm and hand. "Dr. Raliegh Ip knows about it. I had an MRI and I can't see my ortho until 12/26/20. I need something for pain." Pt. Has an appointment for Thursday - he is currently out of town.    Answer Assessment - Initial Assessment Questions 1. ONSET: "When did the pain start?"     3 months ago 2. LOCATION: "Where is the pain located?"     Left shoulder and arm 3. PAIN: "How bad is the pain?" (Scale 1-10; or mild, moderate, severe)   - MILD (1-3): doesn't interfere with normal activities   - MODERATE (4-7): interferes with normal activities (e.g., work or school) or awakens from sleep   - SEVERE (8-10): excruciating pain, unable to do any normal activities, unable to move arm at all due to pain     7 4. WORK OR EXERCISE: "Has there been any recent work or exercise that involved this part of the body?"     No 5. CAUSE: "What do you think is causing the shoulder pain?"     Unsure 6. OTHER SYMPTOMS: "Do you have any other symptoms?" (e.g., neck pain, swelling, rash, fever, numbness, weakness)     Tingling 7. PREGNANCY: "Is there any chance you are pregnant?" "When was your last menstrual period?"     N/a  Protocols used: Shoulder Pain-A-AH

## 2020-11-29 ENCOUNTER — Encounter: Payer: Self-pay | Admitting: Family Medicine

## 2020-11-29 ENCOUNTER — Ambulatory Visit (INDEPENDENT_AMBULATORY_CARE_PROVIDER_SITE_OTHER): Payer: Medicare Other | Admitting: Family Medicine

## 2020-11-29 ENCOUNTER — Ambulatory Visit: Payer: Medicare Other | Admitting: Family Medicine

## 2020-11-29 ENCOUNTER — Other Ambulatory Visit: Payer: Self-pay

## 2020-11-29 VITALS — BP 134/71 | HR 76 | Ht 68.5 in | Wt 220.8 lb

## 2020-11-29 DIAGNOSIS — M25512 Pain in left shoulder: Secondary | ICD-10-CM | POA: Diagnosis not present

## 2020-11-29 DIAGNOSIS — G2581 Restless legs syndrome: Secondary | ICD-10-CM

## 2020-11-29 DIAGNOSIS — M7582 Other shoulder lesions, left shoulder: Secondary | ICD-10-CM | POA: Diagnosis not present

## 2020-11-29 DIAGNOSIS — G8929 Other chronic pain: Secondary | ICD-10-CM | POA: Diagnosis not present

## 2020-11-29 NOTE — Patient Instructions (Addendum)
Thank you for coming to the office today.  You received a Left Shoulder Joint steroid injection today. - Lidocaine numbing medicine may ease the pain initially for a few hours until it wears off - As discussed, you may experience a "steroid flare" this evening or within 24-48 hours, anytime medicine is injected into an inflamed joint it can cause the pain to get worse temporarily - Everyone responds differently to these injections, it depends on the patient and the severity of the joint problem, it may provide anywhere from days to weeks, to months of relief. Ideal response is >6 months relief - Try to take it easy for next 1-2 days, avoid over activity and strain on joint (limit lifting for shoulder) - Recommend the following:   - For swelling - rest, compression sleeve / ACE wrap, elevation, and ice packs as needed for first few days   - For pain in future may use heating pad or moist heat as needed  Please schedule a Follow-up Appointment to: Return if symptoms worsen or fail to improve.  If you have any other questions or concerns, please feel free to call the office or send a message through [place]. You may also schedule an earlier appointment if necessary.  Additionally, you may be receiving a survey about your experience at our office within a few days to 1 week by e-mail or mail. We value your feedback.  [name], DO [place]

## 2020-11-29 NOTE — Progress Notes (Signed)
Subjective:    Patient ID: Curtis Gregory, male    DOB: 03-Jul-1950, 70 y.o.   MRN: [id number]  Curtis Gregory is a 70 y.o. male presenting on 11/29/2020 for Shoulder Pain   HPI  Left Rotator Cuff Tendonitis Left Nerve Impingement Paresthesias LUE   Followed by [facility] Dr Rosita Kea, has had recent LEFT Shoulder MRI for chronic shoulder pain and rotator cuff. See results below, dx w tendonitis, was advised needs injection but they cannot schedule anytime soon. - has tried methocarbamol, topical therapy, and has upcoming PT scheduled Here for injection today Denies any other extremity nerve impingement  RLS Restarted Ropinirole  Depression screen [facility] 2/9 07/04/2020 01/24/2020 03/15/2019  Decreased Interest 0 0 0  Down, Depressed, Hopeless 1 0 0  PHQ - 2 Score 1 0 0  Altered sleeping 1 - -  Tired, decreased energy 0 - -  Change in appetite 0 - -  Feeling bad or failure about yourself  1 - -  Trouble concentrating 0 - -  Moving slowly or fidgety/restless 0 - -  Suicidal thoughts 0 - -  PHQ-9 Score 3 - -  Difficult doing work/chores Not difficult at all - -    Social History   Tobacco Use   Smoking status: Never   Smokeless tobacco: Never  Vaping Use   Vaping Use: Never used  Substance Use Topics   Alcohol use: Yes    Alcohol/week: 1.0 standard drink    Types: 1 Glasses of wine per week    Comment: occasional   Drug use: No    Review of Systems Per HPI unless specifically indicated above     Objective:    BP 134/71   Pulse 76   Ht 5' 8.5" (1.74 m)   Wt 220 lb 12.8 oz (100.2 kg)   SpO2 100%   BMI 33.08 kg/m   Wt Readings from Last 3 Encounters:  11/29/20 220 lb 12.8 oz (100.2 kg)  11/05/20 220 lb (99.8 kg)  10/19/20 220 lb (99.8 kg)    Physical Exam Vitals and nursing note reviewed.  Constitutional:      General: He is not in acute distress.    Appearance: Normal appearance. He is well-developed. He is not diaphoretic.     Comments: Well-appearing,  comfortable, cooperative  HENT:     Head: Normocephalic and atraumatic.  Eyes:     General:        Right eye: No discharge.        Left eye: No discharge.     Conjunctiva/sclera: Conjunctivae normal.  Cardiovascular:     Rate and Rhythm: Normal rate.  Pulmonary:     Effort: Pulmonary effort is normal.  Musculoskeletal:     Comments: Left Shoulder Mild reduced ROM above shoulder flexion Rotator cuff testing positive for pain and impingement  Skin:    General: Skin is warm and dry.     Findings: No erythema or rash.  Neurological:     Mental Status: He is alert and oriented to person, place, and time.  Psychiatric:        Mood and Affect: Mood normal.        Behavior: Behavior normal.        Thought Content: Thought content normal.     Comments: Well groomed, good eye contact, normal speech and thoughts    ________________________________________________________ PROCEDURE NOTE Date: 11/29/20 Left subacromial shoulder injection Discussed benefits and risks (including pain, bleeding, infection, steroid flare). Verbal consent given by  patient. Medication:  1 cc Depo-medrol $RemoveBefore'40mg'[id number]$  and 4 cc Lidocaine 1% without epi Time Out taken  Landmarks identified. Area cleansed with alcohol wipes. Using 21 gauge and 1, 1/2 inch needle, Left knee subacromial bursa space was injected (with above listed medication) via posterior approach cold spray used for superficial anesthetic. Sterile bandage placed. Patient tolerated procedure well without bleeding or paresthesias. No complications.   MR SHOULDER LEFT WO CONTRAST [[id number]] Resulted: 11/19/20 1428  Order Status: Completed Updated: 11/19/20 1430  Narrative:    CLINICAL DATA:  Left shoulder pain goes down left arm with numbness  for three months. No known injury. No history of surgery reported.   EXAM:  MRI OF THE LEFT SHOULDER WITHOUT CONTRAST   TECHNIQUE:  Multiplanar, multisequence MR imaging of the shoulder was performed.  No  intravenous contrast was administered.   COMPARISON:  None.   FINDINGS:  Rotator cuff: Moderate tendinosis of the supraspinatus and  infraspinatus tendons. Teres minor tendon is intact. Subscapularis  tendon is intact.   Muscles: No atrophy or abnormal signal of the muscles of the rotator  cuff.   Biceps long head: Mild tendinosis of the intra-articular portion of  the long head of the biceps tendon.   Acromioclavicular Joint: Moderate arthropathy of the  acromioclavicular joint. Type II acromion. Trace  subacromial/subdeltoid bursal fluid.   Glenohumeral Joint: No joint effusion.  No chondral defect.   Labrum: Grossly intact, but evaluation is limited by lack of  intraarticular fluid.   Bones: No marrow abnormality, fracture, or dislocation.   Other: None.   IMPRESSION:  1. Moderate tendinosis of the supraspinatus and infraspinatus  tendons.  2. Mild tendinosis of the intra-articular portion of the long head  of the biceps tendon.    Electronically Signed    By: Kathreen Devoid M.D.    On: 11/19/2020 14:28     Results for orders placed or performed in visit on 10/12/20  PSA  Result Value Ref Range   PSA 0.21 < OR = 4.00 ng/mL  Hemoglobin A1c  Result Value Ref Range   Hgb A1c MFr Bld 5.5 <5.7 % of total Hgb   Mean Plasma Glucose 111 mg/dL   eAG (mmol/L) 6.2 mmol/L  Lipid panel  Result Value Ref Range   Cholesterol 151 <200 mg/dL   HDL 48 > OR = 40 mg/dL   Triglycerides 64 <150 mg/dL   LDL Cholesterol (Calc) 88 mg/dL (calc)   Total CHOL/HDL Ratio 3.1 <5.0 (calc)   Non-HDL Cholesterol (Calc) 103 <130 mg/dL (calc)  CBC with Differential/Platelet  Result Value Ref Range   WBC 4.6 3.8 - 10.8 Thousand/uL   RBC 4.47 4.20 - 5.80 Million/uL   Hemoglobin 13.8 13.2 - 17.1 g/dL   HCT 41.3 38.5 - 50.0 %   MCV 92.4 80.0 - 100.0 fL   MCH 30.9 27.0 - 33.0 pg   MCHC 33.4 32.0 - 36.0 g/dL   RDW 12.0 11.0 - 15.0 %   Platelets 274 140 - 400 Thousand/uL   MPV 8.6 7.5 -  12.5 fL   Neutro Abs 1,587 1,500 - 7,800 cells/uL   Lymphs Abs 2,286 850 - 3,900 cells/uL   Absolute Monocytes 488 200 - 950 cells/uL   Eosinophils Absolute 189 15 - 500 cells/uL   Basophils Absolute 51 0 - 200 cells/uL   Neutrophils Relative % 34.5 %   Total Lymphocyte 49.7 %   Monocytes Relative 10.6 %   Eosinophils Relative 4.1 %   Basophils Relative 1.1 %  COMPLETE METABOLIC PANEL WITH GFR  Result Value Ref Range   Glucose, Bld 106 (H) 65 - 99 mg/dL   BUN 17 7 - 25 mg/dL   Creat 0.97 0.70 - 1.35 mg/dL   eGFR 85 > OR = 60 mL/min/1.30m2   BUN/Creatinine Ratio NOT APPLICABLE 6 - 22 (calc)   Sodium 139 135 - 146 mmol/L   Potassium 4.1 3.5 - 5.3 mmol/L   Chloride 104 98 - 110 mmol/L   CO2 27 20 - 32 mmol/L   Calcium 8.9 8.6 - 10.3 mg/dL   Total Protein 6.9 6.1 - 8.1 g/dL   Albumin 4.2 3.6 - 5.1 g/dL   Globulin 2.7 1.9 - 3.7 g/dL (calc)   AG Ratio 1.6 1.0 - 2.5 (calc)   Total Bilirubin 0.6 0.2 - 1.2 mg/dL   Alkaline phosphatase (APISO) 61 35 - 144 U/L   AST 15 10 - 35 U/L   ALT 12 9 - 46 U/L      Assessment & Plan:   Problem List Items Addressed This Visit     Restless leg syndrome   Other Visit Diagnoses     Tendinitis of left rotator cuff    -  Primary   Chronic left shoulder pain           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 Continue current other therapy / medicines Has upcoming PT scheduled Discussed that this subacromial approach is only inj for shoulder I am trained for, if it is unsuccessful then we would consider US guided tendon localized approach back at Ortho or Sports Medicine.  No orders of the defined types were placed in this encounter.     Follow up plan: Return if symptoms worsen or fail to improve.   [name], [facility] 11/29/2020, 8:52 AM

## 2020-12-17 ENCOUNTER — Ambulatory Visit: Payer: Self-pay

## 2020-12-17 ENCOUNTER — Other Ambulatory Visit: Payer: Self-pay | Admitting: Urology

## 2020-12-17 DIAGNOSIS — N401 Enlarged prostate with lower urinary tract symptoms: Secondary | ICD-10-CM

## 2020-12-17 DIAGNOSIS — N5201 Erectile dysfunction due to arterial insufficiency: Secondary | ICD-10-CM

## 2020-12-17 NOTE — Telephone Encounter (Signed)
Summary: clinical advice   Pt called and stated that he is still having shoulder pain and would like some clinical advice. Please advise    Called pt and LM on VM.

## 2020-12-17 NOTE — Telephone Encounter (Signed)
Pt returned call, states his L shoulder is no longer hurting. He went to see his Orthopedic provider and pt says that he prescribed him a steroid pack that has helped his shoulder but his L thumb and index finger are still hurting. He reports that grabbing objects especially cold objects make it worse. At times he says he feels like it swells and has numbness in those fingers. Pt says he is taking Ibuprofen but isn't helping. Appt scheduled for 12/24/20 at 1400 with Dr. Janett Billow to schedule appt sooner with Rollene Fare, NP but pt preferred to stay with Dr. Raliegh Ip. Advised pt to call back if symptoms worsen. Care advice given and pt verbalized understanding. No other questions/concerns noted.     Reason for Disposition  [1] MODERATE pain (e.g., interferes with normal activities) AND [2] present > 3 days  Answer Assessment - Initial Assessment Questions 1. ONSET: "When did the pain start?"     2- 3 months 2. LOCATION: "Where is the pain located?"     L thumb and index finger  3. PAIN: "How bad is the pain?" (Scale 1-10; or mild, moderate, severe)   - MILD (1-3): doesn't interfere with normal activities   - MODERATE (4-7): interferes with normal activities (e.g., work or school) or awakens from sleep   - SEVERE (8-10): excruciating pain, unable to use hand at all     7, discomfort  4. WORK OR EXERCISE: "Has there been any recent work or exercise that involved this part (i.e., hand or wrist) of the body?"     No 5. CAUSE: "What do you think is causing the pain?"     unsure 6. AGGRAVATING FACTORS: "What makes the pain worse?" (e.g., using computer)     Picking up objects, cold objects make it throb 7. OTHER SYMPTOMS: "Do you have any other symptoms?" (e.g., neck pain, swelling, rash, numbness, fever)     Swelling and numbess 8. PREGNANCY: "Is there any chance you are pregnant?" "When was your last menstrual period?"     NA  Protocols used: Hand and Wrist Pain-A-AH

## 2020-12-24 ENCOUNTER — Ambulatory Visit: Payer: Managed Care, Other (non HMO) | Admitting: Family Medicine

## 2020-12-25 ENCOUNTER — Ambulatory Visit: Payer: Self-pay

## 2020-12-25 NOTE — Telephone Encounter (Signed)
Chief Complaint: COVID Symptoms: stuffy nose, runny nose, congestion,mild cough Frequency: 5 days Pertinent Negatives: Patient denies fever, SOB Disposition: [] ED /[] Urgent Care (no appt availability in office) / [x] Appointment(In office/virtual)/ [x]  [facility]/ [] Home Care/ [] Refused Recommended Disposition  Additional Notes: Advised pt today is considered his 5th day of symptoms. Scheduled him VV for 12/27/20 at 1120 with Rollene Fare, NP. Told him I will send a message to see if anti-viral med can be sent in today or need to wait until appt. Advised he can also do virtual UC visit.    Summary: Positive COVID   COVID home test reflected positive on 12/23/2020, patient currently experiencing stuffy nose, runny nose, congestion,mild cough, no fever. Currently isolating taking OTC seeking clinical advice regarding OTC and isolation period.      Reason for Disposition  [1] COVID-19 diagnosed by positive lab test (e.g., PCR, rapid self-test kit) AND [2] mild symptoms (e.g., cough, fever, others) AND [6] no complications or SOB  Answer Assessment - Initial Assessment Questions 1. COVID-19 DIAGNOSIS: "Who made your COVID-19 diagnosis?" "Was it confirmed by a positive lab test or self-test?" If not diagnosed by a doctor (or NP/PA), ask "Are there lots of cases (community spread) where you live?" Note: See public health department website, if unsure.     Home test 12/23/20 2. COVID-19 EXPOSURE: "Was there any known exposure to COVID before the symptoms began?" CDC Definition of close contact: within 6 feet (2 meters) for a total of 15 minutes or more over a 24-hour period.      *No Answer* 3. ONSET: "When did the COVID-19 symptoms start?"    5 days 4. WORST SYMPTOM: "What is your worst symptom?" (e.g., cough, fever, shortness of breath, muscle aches)     congestion 5. COUGH: "Do you have a cough?" If Yes, ask: "How bad is the cough?"       Cough  6. FEVER: "Do you have a fever?" If  Yes, ask: "What is your temperature, how was it measured, and when did it start?"     No 7. RESPIRATORY STATUS: "Describe your breathing?" (e.g., shortness of breath, wheezing, unable to speak)      No 8. BETTER-SAME-WORSE: "Are you getting better, staying the same or getting worse compared to yesterday?"  If getting worse, ask, "In what way?"     better 9. HIGH RISK DISEASE: "Do you have any chronic medical problems?" (e.g., asthma, heart or lung disease, weak immune system, obesity, etc.)     *No Answer* 10. VACCINE: "Have you had the COVID-19 vaccine?" If Yes, ask: "Which one, how many shots, when did you get it?"       yes 11. BOOSTER: "Have you received your COVID-19 booster?" If Yes, ask: "Which one and when did you get it?"       yes       *No Answer* 13. OTHER SYMPTOMS: "Do you have any other symptoms?"  (e.g., chills, fatigue, headache, loss of smell or taste, muscle pain, sore throat)       *No Answer* 14. O2 SATURATION MONITOR:  "Do you use an oxygen saturation monitor (pulse oximeter) at home?" If Yes, ask "What is your reading (oxygen level) today?" "What is your usual oxygen saturation reading?" (e.g., 95%)       *No Answer*  Protocols used: Coronavirus (COVID-19) Diagnosed or Suspected-A-AH

## 2020-12-25 NOTE — Telephone Encounter (Signed)
He has to be seen before medication can be sent to pharmacy

## 2020-12-27 ENCOUNTER — Telehealth (INDEPENDENT_AMBULATORY_CARE_PROVIDER_SITE_OTHER): Payer: Medicare Other | Admitting: Internal Medicine

## 2020-12-27 ENCOUNTER — Encounter: Payer: Self-pay | Admitting: Internal Medicine

## 2020-12-27 ENCOUNTER — Other Ambulatory Visit: Payer: Self-pay

## 2020-12-27 DIAGNOSIS — U071 COVID-19: Secondary | ICD-10-CM

## 2020-12-27 NOTE — Patient Instructions (Signed)

## 2020-12-27 NOTE — Progress Notes (Signed)
Virtual Visit via Video Note  I connected with Curtis Gregory on 12/27/20 at 11:20 AM EST by a video enabled telemedicine application and verified that I am speaking with the correct person using two identifiers.  Location: Patient: Home Provider: Office  Person's participating in this video call: Webb Silversmith, NP and Curtis Gregory.   I discussed the limitations of evaluation and management by telemedicine and the availability of in person appointments. The patient expressed understanding and agreed to proceed.  History of Present Illness:  Pt reports nasal congestion, postnasal drip and cough. He reports this started 1 week ago. He is blowing clear mucous out of his nose. The cough is productive of clear mucous. He denies headache, runny nose, ear pain, sore throat, shortness of breath, chest pain, nausea, vomiting or diarrhea. He denies fever, chills or body aches. He tested positive for Covid 5 days ago. He has been taking Mucinex OTC with some relief of symptoms. He has a history of asthma and sleep apnea.   Past Medical History:  Diagnosis Date   Asthma    Benign prostatic hypertrophy with urinary frequency 06/16/2014   Excessive urination at night 06/16/2014   GERD (gastroesophageal reflux disease)    Glaucoma    Hyperlipemia    Hypertension    Internal and external thrombosed hemorrhoids 03/15/2019   Sleep apnea     Current Outpatient Medications  Medication Sig Dispense Refill   albuterol (VENTOLIN HFA) 108 (90 Base) MCG/ACT inhaler Inhale 1-2 puffs into the lungs every 6 (six) hours as needed for wheezing or shortness of breath. Reported on 06/04/2015 8 g 3   Cholecalciferol (VITAMIN D3) 50 MCG (2000 UT) capsule Take 2,000 Units by mouth daily.     fexofenadine (ALLEGRA) 180 MG tablet TAKE 1 TABLET BY MOUTH EVERY DAY (Patient taking differently: Take 180 mg by mouth daily as needed for allergies.) 90 tablet 2   finasteride (PROSCAR) 5 MG tablet Take 1 tablet (5 mg total) by mouth  daily. 90 tablet 3   fluticasone (FLONASE) 50 MCG/ACT nasal spray Place 2 sprays into both nostrils daily. Use for 4-6 weeks then stop and use seasonally or as needed. (Patient taking differently: Place 2 sprays into both nostrils daily as needed for rhinitis.) 16 g 3   hydrochlorothiazide (HYDRODIURIL) 12.5 MG tablet Take 1 tablet (12.5 mg total) by mouth daily. 90 tablet 3   latanoprost (XALATAN) 0.005 % ophthalmic solution Place 1 drop into both eyes at bedtime.   0   methocarbamol (ROBAXIN) 500 MG tablet Take 1 tablet (500 mg total) by mouth every 8 (eight) hours as needed for muscle spasms. 30 tablet 3   omeprazole (PRILOSEC) 40 MG capsule Take 1 capsule (40 mg total) by mouth daily before breakfast. 90 capsule 3   rOPINIRole (REQUIP) 2 MG tablet Take 1 tablet (2 mg total) by mouth at bedtime. 90 tablet 3   rosuvastatin (CRESTOR) 20 MG tablet Take 1 tablet (20 mg total) by mouth at bedtime. 90 tablet 3   SHINGRIX injection Inject 0.5 mL into muscle for shingles vaccine. Repeat dose in 2-6 months. 0.5 mL 1   sildenafil (REVATIO) 20 MG tablet TAKE 2 TO 5 TABLETS BY MOUTH ONE  HOUR  PRIOR  TO  INTERCOURSE 30 tablet 0   tamsulosin (FLOMAX) 0.4 MG CAPS capsule Take 1 capsule (0.4 mg total) by mouth daily after breakfast. 90 capsule 3   timolol (TIMOPTIC-XR) 0.5 % ophthalmic gel-forming Place 1 drop into the left eye at bedtime.  0   No current facility-administered medications for this visit.    No Known Allergies  Family History  Problem Relation Age of Onset   Diabetes Mother    Heart disease Mother    Alzheimer's disease Father    Prostate cancer Father    Prostate cancer Brother    Kidney cancer Neg Hx     Social History   Socioeconomic History   Marital status: Married    Spouse name: Not on file   Number of children: Not on file   Years of education: Not on file   Highest education level: Master's degree (e.g., MA, MS, MEng, MEd, MSW, MBA)  Occupational History    Occupation: retired  Tobacco Use   Smoking status: Never   Smokeless tobacco: Never  Vaping Use   Vaping Use: Never used  Substance and Sexual Activity   Alcohol use: Yes    Alcohol/week: 1.0 standard drink    Types: 1 Glasses of wine per week    Comment: occasional   Drug use: No   Sexual activity: Not Currently  Other Topics Concern   Not on file  Social History Narrative   Not on file   Social Determinants of Health   Financial Resource Strain: Low Risk    Difficulty of Paying Living Expenses: Not hard at all  Food Insecurity: No Food Insecurity   Worried About Charity fundraiser in the Last Year: Never true   [place] in the Last Year: Never true  Transportation Needs: No Transportation Needs   Lack of Transportation (Medical): No   Lack of Transportation (Non-Medical): No  Physical Activity: Insufficiently Active   Days of Exercise per Week: 3 days   Minutes of Exercise per Session: 30 min  Stress: No Stress Concern Present   Feeling of Stress : Not at all  Social Connections: Not on file  Intimate Partner Violence: Not on file     Constitutional: Denies fever, malaise, fatigue, headache or abrupt weight changes.  HEENT: Pt reports nasal congestion, post nasal drip. Denies eye pain, eye redness, ear pain, ringing in the ears, wax buildup, runny nose, bloody nose, or sore throat. Respiratory: Pt reports cough. Denies difficulty breathing, shortness of breath, cough or sputum production.   Cardiovascular: Denies chest pain, chest tightness, palpitations or swelling in the hands or feet.  Gastrointestinal: Denies abdominal pain, bloating, constipation, diarrhea or blood in the stool.   No other specific complaints in a complete review of systems (except as listed in HPI above).    Observations/Objective:   Wt Readings from Last 3 Encounters:  11/29/20 220 lb 12.8 oz (100.2 kg)  11/05/20 220 lb (99.8 kg)  10/19/20 220 lb (99.8 kg)    General: Appears  his stated age, well developed, well nourished in NAD. HEENT: Nose: congestion noted; Throat/Mouth: hoarseness noted Pulmonary/Chest: Normal effort. No respiratory distress.  Neurological: Alert and oriented.   BMET    Component Value Date/Time   NA 139 10/15/2020 0827   K 4.1 10/15/2020 0827   CL 104 10/15/2020 0827   CO2 27 10/15/2020 0827   GLUCOSE 106 (H) 10/15/2020 0827   BUN 17 10/15/2020 0827   CREATININE 0.97 10/15/2020 0827   CALCIUM 8.9 10/15/2020 0827   GFRNONAA >60 08/10/2020 0606   GFRNONAA 71 09/08/2017 0824   GFRAA >60 07/28/2019 1118   GFRAA 82 09/08/2017 0824    Lipid Panel     Component Value Date/Time   CHOL 151 10/15/2020  0827   TRIG 64 10/15/2020 0827   HDL 48 10/15/2020 0827   CHOLHDL 3.1 10/15/2020 0827   VLDL 10 05/22/2016 1001   LDLCALC 88 10/15/2020 0827    CBC    Component Value Date/Time   WBC 4.6 10/15/2020 0827   RBC 4.47 10/15/2020 0827   HGB 13.8 10/15/2020 0827   HCT 41.3 10/15/2020 0827   PLT 274 10/15/2020 0827   MCV 92.4 10/15/2020 0827   MCH 30.9 10/15/2020 0827   MCHC 33.4 10/15/2020 0827   RDW 12.0 10/15/2020 0827   LYMPHSABS 2,286 10/15/2020 0827   MONOABS 0.6 08/01/2020 0847   EOSABS 189 10/15/2020 0827   BASOSABS 51 10/15/2020 0827    Hgb A1C Lab Results  Component Value Date   HGBA1C 5.5 10/15/2020       Assessment and Plan:  Covid 19:  Mild symptoms, improving with OTC meds Encouraged rest and fluids He is outside the window for antiviral therapy Encouraged him to take his Flonase and Allegra OTC Return precautions discussed  Follow Up Instructions:    I discussed the assessment and treatment plan with the patient. The patient was provided an opportunity to ask questions and all were answered. The patient agreed with the plan and demonstrated an understanding of the instructions.   The patient was advised to call back or seek an in-person evaluation if the symptoms worsen or if the condition fails  to improve as anticipated.   Webb Silversmith, NP

## 2021-01-11 DIAGNOSIS — M47812 Spondylosis without myelopathy or radiculopathy, cervical region: Secondary | ICD-10-CM | POA: Diagnosis not present

## 2021-01-11 DIAGNOSIS — M7582 Other shoulder lesions, left shoulder: Secondary | ICD-10-CM | POA: Diagnosis not present

## 2021-01-11 DIAGNOSIS — M25512 Pain in left shoulder: Secondary | ICD-10-CM | POA: Diagnosis not present

## 2021-01-21 ENCOUNTER — Telehealth: Payer: Self-pay | Admitting: Family Medicine

## 2021-01-21 NOTE — Telephone Encounter (Signed)
1/9 LM advising pt to call us back, we need to reschedule his AWV on 01/29/21 to a Friday due to [facility] schedule change.

## 2021-01-22 ENCOUNTER — Other Ambulatory Visit: Payer: Self-pay | Admitting: Urology

## 2021-01-22 DIAGNOSIS — N401 Enlarged prostate with lower urinary tract symptoms: Secondary | ICD-10-CM

## 2021-01-22 DIAGNOSIS — N5201 Erectile dysfunction due to arterial insufficiency: Secondary | ICD-10-CM

## 2021-01-22 DIAGNOSIS — R350 Frequency of micturition: Secondary | ICD-10-CM

## 2021-01-29 ENCOUNTER — Ambulatory Visit: Payer: Medicare Other

## 2021-02-28 ENCOUNTER — Other Ambulatory Visit: Payer: Self-pay

## 2021-02-28 ENCOUNTER — Ambulatory Visit (INDEPENDENT_AMBULATORY_CARE_PROVIDER_SITE_OTHER): Payer: Medicare Other

## 2021-02-28 DIAGNOSIS — Z0000 Encounter for general adult medical examination without abnormal findings: Secondary | ICD-10-CM | POA: Diagnosis not present

## 2021-02-28 NOTE — Progress Notes (Addendum)
Virtual Visit via Telephone Note  I connected with Curtis Gregory on 02/28/21 at  3:20 PM EST by telephone and verified that I am speaking with the correct person using two identifiers.  Location: Patient:  Curtis Gregory  Provider: [name], DO   I discussed the limitations, risks, security and privacy concerns of performing an evaluation and management service by telephone and the availability of in person appointments. I also discussed with the patient that there may be a patient responsible charge related to this service. The patient expressed understanding and agreed to proceed.   I provided 40 minutes of non-face-to-face time during this encounter.   Wilson Singer, CMA  HPI:  Patient presents to clinic today for their subsequent annual Medicare wellness exam.  Past Medical History:  Diagnosis Date   Asthma    Benign prostatic hypertrophy with urinary frequency 06/16/2014   Excessive urination at night 06/16/2014   GERD (gastroesophageal reflux disease)    Glaucoma    Hyperlipemia    Hypertension    Internal and external thrombosed hemorrhoids 03/15/2019   Sleep apnea     Current Outpatient Medications  Medication Sig Dispense Refill   albuterol (VENTOLIN HFA) 108 (90 Base) MCG/ACT inhaler Inhale 1-2 puffs into the lungs every 6 (six) hours as needed for wheezing or shortness of breath. Reported on 06/04/2015 8 g 3   Cholecalciferol (VITAMIN D3) 50 MCG (2000 UT) capsule Take 2,000 Units by mouth daily.     fexofenadine (ALLEGRA) 180 MG tablet TAKE 1 TABLET BY MOUTH EVERY DAY (Patient taking differently: Take 180 mg by mouth daily as needed for allergies.) 90 tablet 2   finasteride (PROSCAR) 5 MG tablet Take 1 tablet (5 mg total) by mouth daily. 90 tablet 3   fluticasone (FLONASE) 50 MCG/ACT nasal spray Place 2 sprays into both nostrils daily. Use for 4-6 weeks then stop and use seasonally or as needed. (Patient taking differently: Place 2 sprays into both nostrils  daily as needed for rhinitis.) 16 g 3   hydrochlorothiazide (HYDRODIURIL) 12.5 MG tablet Take 1 tablet (12.5 mg total) by mouth daily. 90 tablet 3   latanoprost (XALATAN) 0.005 % ophthalmic solution Place 1 drop into both eyes at bedtime.   0   meloxicam (MOBIC) 7.5 MG tablet Take 7.5 mg by mouth daily as needed.     omeprazole (PRILOSEC) 40 MG capsule Take 1 capsule (40 mg total) by mouth daily before breakfast. 90 capsule 3   rOPINIRole (REQUIP) 2 MG tablet Take 1 tablet (2 mg total) by mouth at bedtime. 90 tablet 3   rosuvastatin (CRESTOR) 20 MG tablet Take 1 tablet (20 mg total) by mouth at bedtime. 90 tablet 3   sildenafil (REVATIO) 20 MG tablet TAKE 2-5 TABLETS BY MOUTH ONE HOUR PRIOR TO INTERCOURSE 30 tablet 2   tamsulosin (FLOMAX) 0.4 MG CAPS capsule Take 1 capsule (0.4 mg total) by mouth daily after breakfast. 90 capsule 3   timolol (TIMOPTIC-XR) 0.5 % ophthalmic gel-forming Place 1 drop into the left eye at bedtime.   0   SHINGRIX injection Inject 0.5 mL into muscle for shingles vaccine. Repeat dose in 2-6 months. (Patient not taking: Reported on 02/28/2021) 0.5 mL 1   No current facility-administered medications for this visit.    No Known Allergies  Family History  Problem Relation Age of Onset   Diabetes Mother    Heart disease Mother    Alzheimer's disease Father    Prostate cancer Father    Prostate  cancer Brother    Kidney cancer Neg Hx     Social History   Socioeconomic History   Marital status: Married    Spouse name: Not on file   Number of children: 3   Years of education: Not on file   Highest education level: Master's degree (e.g., MA, MS, MEng, MEd, MSW, MBA)  Occupational History   Occupation: retired  Tobacco Use   Smoking status: Never   Smokeless tobacco: Never  Vaping Use   Vaping Use: Never used  Substance and Sexual Activity   Alcohol use: Yes    Alcohol/week: 1.0 standard drink    Types: 1 Glasses of wine per week    Comment: occasional    Drug use: No   Sexual activity: Not Currently  Other Topics Concern   Not on file  Social History Narrative   Not on file   Social Determinants of Health   Financial Resource Strain: Low Risk    Difficulty of Paying Living Expenses: Not hard at all  Food Insecurity: No Food Insecurity   Worried About Charity fundraiser in the Last Year: Never true   [place] in the Last Year: Never true  Transportation Needs: No Transportation Needs   Lack of Transportation (Medical): No   Lack of Transportation (Non-Medical): No  Physical Activity: Insufficiently Active   Days of Exercise per Week: 4 days   Minutes of Exercise per Session: 30 min  Stress: No Stress Concern Present   Feeling of Stress : Not at all  Social Connections: Socially Integrated   Frequency of Communication with Friends and Family: More than three times a week   Frequency of Social Gatherings with Friends and Family: More than three times a week   Attends Religious Services: More than 4 times per year   Active Member of Clubs or Organizations: Yes   Attends Music therapist: More than 4 times per year   Marital Status: Married  Human resources officer Violence: Not on file    Hospitiliaztions: No Hospitalization in the past 12 mths   Health Maintenance:  Shingrix: The pt is going to get shingle vaccine today through his pharmacy Pneumonia: 09/08/2018 Prevnar: 10/06/2016 COVID vaccine: 10/24/19, 03/17/19, 02/20/19 Pfizer Covid Bivalent: 09/25/20 Influenza: 10/19/20 TDAP: 10/28/14   PSA: 0.21ng/mL  10/15/20 A1C: 5.5%           10/15/20 Colonoscopy: 09/11/2016, next due on 08/15/2021  Goals: Recommend drinking at least 6-8 glasses of water a day      Providers:   PCP: [name], DO  Urologist: Dr. Bernardo Heater, [place]             Orthopedic: Hessie Knows, MD  I have personally reviewed and have noted:  1. The patient's medical and social history 2. Their use of alcohol, tobacco or  illicit drugs 3. Their current medications and supplements 4. The patient's functional ability including ADL's, fall risks, home safety risks and hearing or visual impairment. 5. Diet and physical activities 6. Evidence for depression or mood disorder  Subjective:   Review of Systems:   Constitutional: Denies fever, malaise, fatigue, headache or abrupt weight changes.  HEENT: Denies eye pain, eye redness, ear pain, ringing in the ears, wax buildup, runny nose, nasal congestion, bloody nose, or sore throat. Respiratory: Denies difficulty breathing, shortness of breath, cough or sputum production.   Cardiovascular: Denies chest pain, chest tightness, palpitations or swelling in the hands or feet.  Gastrointestinal: Denies abdominal pain, bloating,  constipation, diarrhea or blood in the stool.  GU: Denies urgency, frequency, pain with urination, burning sensation, blood in urine, odor or discharge. Musculoskeletal: Numbness in the thumb and index finger on the left hand. Left shoulder pain. Skin: Denies redness, rashes, lesions or ulcercations.  Neurological: Denies dizziness, difficulty with memory, difficulty with speech or problems with balance and coordination.  Psych: Denies anxiety, depression, SI/HI.  No other specific complaints in a complete review of systems (except as listed in HPI above).  Objective:  PE:   There were no vitals taken for this visit. Wt Readings from Last 3 Encounters:  11/29/20 220 lb 12.8 oz (100.2 kg)  11/05/20 220 lb (99.8 kg)  10/19/20 220 lb (99.8 kg)     BMET    Component Value Date/Time   NA 139 10/15/2020 0827   K 4.1 10/15/2020 0827   CL 104 10/15/2020 0827   CO2 27 10/15/2020 0827   GLUCOSE 106 (H) 10/15/2020 0827   BUN 17 10/15/2020 0827   CREATININE 0.97 10/15/2020 0827   CALCIUM 8.9 10/15/2020 0827   GFRNONAA >60 08/10/2020 0606   GFRNONAA 71 09/08/2017 0824   GFRAA >60 07/28/2019 1118   GFRAA 82 09/08/2017 0824    Lipid Panel      Component Value Date/Time   CHOL 151 10/15/2020 0827   TRIG 64 10/15/2020 0827   HDL 48 10/15/2020 0827   CHOLHDL 3.1 10/15/2020 0827   VLDL 10 05/22/2016 1001   LDLCALC 88 10/15/2020 0827    CBC    Component Value Date/Time   WBC 4.6 10/15/2020 0827   RBC 4.47 10/15/2020 0827   HGB 13.8 10/15/2020 0827   HCT 41.3 10/15/2020 0827   PLT 274 10/15/2020 0827   MCV 92.4 10/15/2020 0827   MCH 30.9 10/15/2020 0827   MCHC 33.4 10/15/2020 0827   RDW 12.0 10/15/2020 0827   LYMPHSABS 2,286 10/15/2020 0827   MONOABS 0.6 08/01/2020 0847   EOSABS 189 10/15/2020 0827   BASOSABS 51 10/15/2020 0827    Hgb A1C Lab Results  Component Value Date   HGBA1C 5.5 10/15/2020      Assessment and Plan:   Medicare Annual Wellness Visit:  Diet: Heart healthy  Physical activity: Moderate  Exercise  Depression/mood screen: Negative,  [place] Office Visit from 07/04/2020 in [place]  PHQ-9 Total Score 3      Hearing: Intact to whispered voice Visual acuity: Grossly normal, performs annual eye exam  ADLs: Capable Fall risk: None Home safety: Good Cognitive evaluation:  6CIT Screen 02/28/2021 01/24/2020 09/15/2017  What Year? 0 points 0 points 0 points  What month? 0 points 0 points 0 points  What time? 0 points 0 points 0 points  Count back from 20 0 points 0 points 0 points  Months in reverse 0 points 0 points 0 points  Repeat phrase 0 points 2 points 2 points  Total Score 0 2 2     EOL planning: No Adv directives, full code/  Nurse's Notes: The pt reports that he was recently seen by Ortho and was referred to Neurology for his left shoulder pain. He has an appt scheduled with Dr. Manuella Ghazi on 03/27/21.  Next appointment: No upcoming scheduled at this time.   Wilson Singer, [place]

## 2021-03-01 DIAGNOSIS — H3561 Retinal hemorrhage, right eye: Secondary | ICD-10-CM | POA: Diagnosis not present

## 2021-03-01 DIAGNOSIS — H401233 Low-tension glaucoma, bilateral, severe stage: Secondary | ICD-10-CM | POA: Diagnosis not present

## 2021-03-01 DIAGNOSIS — H16103 Unspecified superficial keratitis, bilateral: Secondary | ICD-10-CM | POA: Diagnosis not present

## 2021-03-01 DIAGNOSIS — H2513 Age-related nuclear cataract, bilateral: Secondary | ICD-10-CM | POA: Diagnosis not present

## 2021-03-07 NOTE — Addendum Note (Signed)
Addended by: Wilson Singer on: 03/07/2021 04:02 PM   Modules accepted: Level of Service

## 2021-03-21 ENCOUNTER — Other Ambulatory Visit: Payer: Medicare Other

## 2021-03-21 ENCOUNTER — Other Ambulatory Visit: Payer: Self-pay

## 2021-03-21 ENCOUNTER — Other Ambulatory Visit: Payer: Self-pay | Admitting: *Deleted

## 2021-03-21 DIAGNOSIS — N401 Enlarged prostate with lower urinary tract symptoms: Secondary | ICD-10-CM | POA: Diagnosis not present

## 2021-03-21 DIAGNOSIS — R350 Frequency of micturition: Secondary | ICD-10-CM | POA: Diagnosis not present

## 2021-03-22 ENCOUNTER — Ambulatory Visit: Payer: Medicare Other | Admitting: Urology

## 2021-03-22 LAB — PSA: Prostate Specific Ag, Serum: 0.4 ng/mL (ref 0.0–4.0)

## 2021-04-02 ENCOUNTER — Other Ambulatory Visit: Payer: Self-pay

## 2021-04-02 DIAGNOSIS — Z23 Encounter for immunization: Secondary | ICD-10-CM

## 2021-04-03 ENCOUNTER — Ambulatory Visit (INDEPENDENT_AMBULATORY_CARE_PROVIDER_SITE_OTHER): Payer: Medicare Other | Admitting: Urology

## 2021-04-03 ENCOUNTER — Encounter: Payer: Self-pay | Admitting: Urology

## 2021-04-03 ENCOUNTER — Telehealth: Payer: Self-pay

## 2021-04-03 ENCOUNTER — Other Ambulatory Visit: Payer: Self-pay

## 2021-04-03 VITALS — BP 133/79 | HR 61 | Ht 68.5 in | Wt 214.0 lb

## 2021-04-03 DIAGNOSIS — R202 Paresthesia of skin: Secondary | ICD-10-CM | POA: Diagnosis not present

## 2021-04-03 DIAGNOSIS — N5201 Erectile dysfunction due to arterial insufficiency: Secondary | ICD-10-CM

## 2021-04-03 DIAGNOSIS — N401 Enlarged prostate with lower urinary tract symptoms: Secondary | ICD-10-CM | POA: Diagnosis not present

## 2021-04-03 DIAGNOSIS — Z23 Encounter for immunization: Secondary | ICD-10-CM

## 2021-04-03 DIAGNOSIS — M79602 Pain in left arm: Secondary | ICD-10-CM | POA: Diagnosis not present

## 2021-04-03 DIAGNOSIS — R208 Other disturbances of skin sensation: Secondary | ICD-10-CM | POA: Diagnosis not present

## 2021-04-03 DIAGNOSIS — U071 COVID-19: Secondary | ICD-10-CM | POA: Diagnosis not present

## 2021-04-03 DIAGNOSIS — R200 Anesthesia of skin: Secondary | ICD-10-CM | POA: Diagnosis not present

## 2021-04-03 DIAGNOSIS — R350 Frequency of micturition: Secondary | ICD-10-CM

## 2021-04-03 DIAGNOSIS — Z8042 Family history of malignant neoplasm of prostate: Secondary | ICD-10-CM | POA: Diagnosis not present

## 2021-04-03 MED ORDER — FINASTERIDE 5 MG PO TABS: 5.0000 mg | ORAL_TABLET | Freq: Every day | ORAL | 3 refills | Status: DC

## 2021-04-03 MED ORDER — SHINGRIX 50 MCG/0.5ML IM SUSR: INTRAMUSCULAR | 1 refills | Status: DC

## 2021-04-03 MED ORDER — SILDENAFIL CITRATE 100 MG PO TABS: ORAL_TABLET | ORAL | 0 refills | Status: DC

## 2021-04-03 MED ORDER — TAMSULOSIN HCL 0.4 MG PO CAPS: 0.4000 mg | ORAL_CAPSULE | Freq: Every day | ORAL | 3 refills | Status: DC

## 2021-04-03 NOTE — Telephone Encounter (Signed)
Copied from [place] [id number]. Topic: General - Other ?>> Apr 02, 2021 11:37 AM Leward Quan A wrote: ?Reason for CRM: Patient called in asking Dr Raliegh Ip to please send a new Rx to his pharmacy for [facility] injection need to have a diagnosis code on it in order for insurance to pay for the vaccine. Please [facility]  [id number] [place]: [phone]  Fax:  [phone]?

## 2021-04-03 NOTE — Telephone Encounter (Signed)
Resent RX to pharmacy 

## 2021-04-03 NOTE — Addendum Note (Signed)
Addended by: Jearld Fenton on: 04/03/2021 10:09 AM ? ? Modules accepted: Orders ? ?

## 2021-04-03 NOTE — Telephone Encounter (Signed)
Please review for Dr. Raliegh Ip. ? ?Thanks,  ? ?-Mickel Baas  ?

## 2021-04-03 NOTE — Progress Notes (Signed)
? ?04/03/2021 ?11:29 AM  ? ?Almeta Monas ?November 20, 1950 ?[id number] ? ?Referring provider: Olin Hauser, DO ?[address] ?[place],  [place] [address] ? ?Chief Complaint  ?Patient presents with  ? Benign Prostatic Hypertrophy  ? ? ?Urologic history: ?1.  BPH with lower urinary tract symptoms ?-Mild to moderate LUTS IPSS 10/35 ?-Combination therapy tamsulosin/finasteride ?  ?2.  Erectile dysfunction ?-Sildenafil ? ?HPI: ?71 y.o. male presents for annual follow-up. ? ?No significant change in his voiding symptoms since last years visit ?Denies dysuria or gross hematuria ?Denies flank, abdominal or pelvic pain ?Stable ED on sildenafil ?PSA 03/21/2021 stable at 0.4 ?Right total hip replacement August 2022 ? ? ?PMH: ?Past Medical History:  ?Diagnosis Date  ? Asthma   ? Benign prostatic hypertrophy with urinary frequency 06/16/2014  ? Excessive urination at night 06/16/2014  ? GERD (gastroesophageal reflux disease)   ? Glaucoma   ? Hyperlipemia   ? Hypertension   ? Internal and external thrombosed hemorrhoids 03/15/2019  ? Sleep apnea   ? ? ?Surgical History: ?Past Surgical History:  ?Procedure Laterality Date  ? BOTOX INJECTION N/A 08/05/2019  ? Procedure: BOTOX INJECTION;  Surgeon: Ronny Bacon, MD;  Location: [facility];  Service: General;  Laterality: N/A;  ? COLONOSCOPY WITH PROPOFOL N/A 09/11/2016  ? Procedure: COLONOSCOPY WITH PROPOFOL;  Surgeon: Lucilla Lame, MD;  Location: [facility];  Service: Gastroenterology;  Laterality: N/A;  sleep apnea  ? HEMORRHOID SURGERY N/A 03/30/2019  ? Procedure: HEMORRHOIDECTOMY;  Surgeon: Ronny Bacon, MD;  Location: [facility];  Service: General;  Laterality: N/A;  ? none    ? POLYPECTOMY N/A 09/11/2016  ? Procedure: POLYPECTOMY;  Surgeon: Lucilla Lame, MD;  Location: [facility];  Service: Gastroenterology;  Laterality: N/A;  ? SPHINCTEROTOMY N/A 08/05/2019  ? Procedure: SPHINCTEROTOMY, Chemical anal;  Surgeon: Ronny Bacon, MD;  Location: [facility];  Service: General;   Laterality: N/A;  ? TOTAL HIP ARTHROPLASTY Right 08/09/2020  ? Procedure: TOTAL HIP ARTHROPLASTY ANTERIOR APPROACH;  Surgeon: Hessie Knows, MD;  Location: [facility];  Service: Orthopedics;  Laterality: Right;  ? ? ?Home Medications:  ?Allergies as of 04/03/2021   ?No Known Allergies ?  ? ?  ?Medication List  ?  ? ?  ? Accurate as of April 03, 2021 11:29 AM. If you have any questions, ask your nurse or doctor.  ?  ?  ? ?  ? ?albuterol 108 (90 Base) MCG/ACT inhaler ?Commonly known as: VENTOLIN HFA ?Inhale 1-2 puffs into the lungs every 6 (six) hours as needed for wheezing or shortness of breath. Reported on 06/04/2015 ?  ?fexofenadine 180 MG tablet ?Commonly known as: ALLEGRA ?TAKE 1 TABLET BY MOUTH EVERY DAY ?What changed:  ?when to take this ?reasons to take this ?  ?finasteride 5 MG tablet ?Commonly known as: PROSCAR ?Take 1 tablet (5 mg total) by mouth daily. ?  ?fluticasone 50 MCG/ACT nasal spray ?Commonly known as: FLONASE ?Place 2 sprays into both nostrils daily. Use for 4-6 weeks then stop and use seasonally or as needed. ?What changed:  ?when to take this ?reasons to take this ?additional instructions ?  ?hydrochlorothiazide 12.5 MG tablet ?Commonly known as: HYDRODIURIL ?Take 1 tablet (12.5 mg total) by mouth daily. ?  ?latanoprost 0.005 % ophthalmic solution ?Commonly known as: XALATAN ?Place 1 drop into both eyes at bedtime. ?  ?meloxicam 7.5 MG tablet ?Commonly known as: MOBIC ?Take 7.5 mg by mouth daily as needed. ?  ?omeprazole 40 MG capsule ?Commonly known as: PRILOSEC ?  Take 1 capsule (40 mg total) by mouth daily before breakfast. ?  ?rOPINIRole 2 MG tablet ?Commonly known as: REQUIP ?Take 1 tablet (2 mg total) by mouth at bedtime. ?  ?rosuvastatin 20 MG tablet ?Commonly known as: CRESTOR ?Take 1 tablet (20 mg total) by mouth at bedtime. ?  ?Shingrix injection ?Generic drug: Zoster Vaccine Adjuvanted ?Inject 0.5 mL into muscle for shingles vaccine. Repeat dose in 2-6 months. ?  ?sildenafil 20 MG  tablet ?Commonly known as: REVATIO ?TAKE 2-5 TABLETS BY MOUTH ONE HOUR PRIOR TO INTERCOURSE ?  ?tamsulosin 0.4 MG Caps capsule ?Commonly known as: FLOMAX ?Take 1 capsule (0.4 mg total) by mouth daily after breakfast. ?  ?timolol 0.5 % ophthalmic gel-forming ?Commonly known as: TIMOPTIC-XR ?Place 1 drop into the left eye at bedtime. ?  ?Vitamin D3 50 MCG (2000 UT) capsule ?Take 2,000 Units by mouth daily. ?  ? ?  ? ? ?Allergies: No Known Allergies ? ?Family History: ?Family History  ?Problem Relation Age of Onset  ? Diabetes Mother   ? Heart disease Mother   ? Alzheimer's disease Father   ? Prostate cancer Father   ? Prostate cancer Brother   ? Kidney cancer Neg Hx   ? ? ?Social History:  reports that he has never smoked. He has never used smokeless tobacco. He reports current alcohol use of about 1.0 standard drink per week. He reports that he does not use drugs. ? ? ?Physical Exam: ?BP 133/79   Pulse 61   Ht 5' 8.5" (1.74 m)   Wt 214 lb (97.1 kg)   BMI 32.07 kg/m?   ?Constitutional:  Alert and oriented, No acute distress. ?HEENT: [place] AT, moist mucus membranes.  Trachea midline, no masses. ?Respiratory: Normal respiratory effort, no increased work of breathing. ?GI: Abdomen is soft, nontender, nondistended, no abdominal masses ?Psychiatric: Normal mood and affect. ? ? ?Assessment & Plan:   ? ?1.  BPH with LUTS ?Stable on tamsulosin/finasteride ?Refill sent to pharmacy ?Continue annual follow-up ? ?2.  Erectile dysfunction ?Stable on sildenafil ?He has been on the 20 mg dose and is taking 4 tablets.   ?Rx generic sildenafil 100 mg was sent to pharmacy ? ?3.  Family history prostate cancer ?Stable PSA at 0.4 ?Tight anal canal  ?Will defer future DRE unless his PSA starts to rise ? ? ?Abbie Sons, MD ? ?[place] ?[address], [address] ?[place], [place] [address] ?[phone] ? ?

## 2021-04-06 DIAGNOSIS — Z20822 Contact with and (suspected) exposure to covid-19: Secondary | ICD-10-CM | POA: Diagnosis not present

## 2021-04-19 DIAGNOSIS — Z20822 Contact with and (suspected) exposure to covid-19: Secondary | ICD-10-CM | POA: Diagnosis not present

## 2021-04-19 DIAGNOSIS — Z20828 Contact with and (suspected) exposure to other viral communicable diseases: Secondary | ICD-10-CM | POA: Diagnosis not present

## 2021-04-23 DIAGNOSIS — R202 Paresthesia of skin: Secondary | ICD-10-CM | POA: Diagnosis not present

## 2021-04-23 DIAGNOSIS — R200 Anesthesia of skin: Secondary | ICD-10-CM | POA: Diagnosis not present

## 2021-05-07 DIAGNOSIS — Z20822 Contact with and (suspected) exposure to covid-19: Secondary | ICD-10-CM | POA: Diagnosis not present

## 2021-05-08 NOTE — Telephone Encounter (Signed)
Pt called to report that he has received word that his PCP did not complete the B section of the paperwork. It is missing a code. His insurance will not cover this without it ? ?Best contact: [phone] ? ?Pt is requesting to speak to someone in the office today, he says this was supposed to be taken care of two weeks ago.  ?

## 2021-05-10 NOTE — Telephone Encounter (Signed)
I have no idea what he is referring to. ? ?Could you get more information? Was this while I was out of office? I dont know what paperwork. ? ?[name], DO ?[facility] ?[facility] ?05/10/2021, 1:06 PM ? ?

## 2021-05-20 NOTE — Telephone Encounter (Signed)
Pt called in a little upset stating he has been waiting for a while now to get his shingles shot, and requesting if PCP could go ahead and get the section B part of the form filled out, and sent over tp the pharmacy so he can get that taken care of, please advise.  ?

## 2021-05-28 DIAGNOSIS — G5632 Lesion of radial nerve, left upper limb: Secondary | ICD-10-CM | POA: Diagnosis not present

## 2021-05-29 DIAGNOSIS — G5632 Lesion of radial nerve, left upper limb: Secondary | ICD-10-CM | POA: Diagnosis not present

## 2021-05-29 DIAGNOSIS — R200 Anesthesia of skin: Secondary | ICD-10-CM | POA: Diagnosis not present

## 2021-05-29 DIAGNOSIS — R208 Other disturbances of skin sensation: Secondary | ICD-10-CM | POA: Diagnosis not present

## 2021-05-29 DIAGNOSIS — R202 Paresthesia of skin: Secondary | ICD-10-CM | POA: Diagnosis not present

## 2021-05-29 DIAGNOSIS — M79602 Pain in left arm: Secondary | ICD-10-CM | POA: Diagnosis not present

## 2021-07-08 DIAGNOSIS — M1611 Unilateral primary osteoarthritis, right hip: Secondary | ICD-10-CM | POA: Diagnosis not present

## 2021-07-08 DIAGNOSIS — G5632 Lesion of radial nerve, left upper limb: Secondary | ICD-10-CM | POA: Diagnosis not present

## 2021-07-08 DIAGNOSIS — M87052 Idiopathic aseptic necrosis of left femur: Secondary | ICD-10-CM | POA: Diagnosis not present

## 2021-07-08 DIAGNOSIS — Z96641 Presence of right artificial hip joint: Secondary | ICD-10-CM | POA: Diagnosis not present

## 2021-07-09 ENCOUNTER — Other Ambulatory Visit: Payer: Self-pay | Admitting: Orthopedic Surgery

## 2021-07-18 ENCOUNTER — Other Ambulatory Visit: Payer: Self-pay | Admitting: Orthopedic Surgery

## 2021-07-18 DIAGNOSIS — G5632 Lesion of radial nerve, left upper limb: Secondary | ICD-10-CM

## 2021-07-18 DIAGNOSIS — Z96641 Presence of right artificial hip joint: Secondary | ICD-10-CM

## 2021-07-22 ENCOUNTER — Ambulatory Visit
Admission: RE | Admit: 2021-07-22 | Discharge: 2021-07-22 | Disposition: A | Discharge status: Home / Self Care | Payer: Medicare Other | Source: Ambulatory Visit | Attending: Orthopedic Surgery | Admitting: Orthopedic Surgery

## 2021-07-22 DIAGNOSIS — Z96641 Presence of right artificial hip joint: Secondary | ICD-10-CM | POA: Diagnosis not present

## 2021-07-22 DIAGNOSIS — G5632 Lesion of radial nerve, left upper limb: Secondary | ICD-10-CM | POA: Diagnosis not present

## 2021-07-22 DIAGNOSIS — M19012 Primary osteoarthritis, left shoulder: Secondary | ICD-10-CM | POA: Diagnosis not present

## 2021-07-22 MED ORDER — GADOBUTROL 1 MMOL/ML IV SOLN
9.0000 mL | Freq: Once | INTRAVENOUS | Status: AC | PRN
  Administered 2021-07-22: 9 mL via INTRAVENOUS

## 2021-08-05 DIAGNOSIS — G4733 Obstructive sleep apnea (adult) (pediatric): Secondary | ICD-10-CM | POA: Diagnosis not present

## 2021-08-07 ENCOUNTER — Other Ambulatory Visit: Payer: Self-pay | Admitting: Urology

## 2021-08-30 DIAGNOSIS — H16103 Unspecified superficial keratitis, bilateral: Secondary | ICD-10-CM | POA: Diagnosis not present

## 2021-08-30 DIAGNOSIS — H2513 Age-related nuclear cataract, bilateral: Secondary | ICD-10-CM | POA: Diagnosis not present

## 2021-08-30 DIAGNOSIS — H401233 Low-tension glaucoma, bilateral, severe stage: Secondary | ICD-10-CM | POA: Diagnosis not present

## 2021-08-30 DIAGNOSIS — H3561 Retinal hemorrhage, right eye: Secondary | ICD-10-CM | POA: Diagnosis not present

## 2021-09-04 DIAGNOSIS — G5632 Lesion of radial nerve, left upper limb: Secondary | ICD-10-CM | POA: Diagnosis not present

## 2021-09-07 ENCOUNTER — Other Ambulatory Visit: Payer: Self-pay | Admitting: Family Medicine

## 2021-09-07 DIAGNOSIS — K219 Gastro-esophageal reflux disease without esophagitis: Secondary | ICD-10-CM

## 2021-09-09 NOTE — Telephone Encounter (Signed)
Requested medication (s) are due for refill today:no  Requested medication (s) are on the active medication list: no  Last refill:  09/07/21  Future visit scheduled: yes  Notes to clinic:  Unable to refill per protocol due to alternate medication requested, no the same on med list, routing for review.     Requested Prescriptions  Pending Prescriptions Disp Refills   esomeprazole ([place]) 40 MG packet [Pharmacy Med Name: ESOMEPRAZOLE DR 40 MG PACKET]  0    Sig: Take 1 capsule (40 mg total) by mouth daily before breakfast.     Gastroenterology: Proton Pump Inhibitors 2 Passed - 09/07/2021 10:19 AM      Passed - ALT in normal range and within 360 days    ALT  Date Value Ref Range Status  10/15/2020 12 9 - 46 U/L Final         Passed - AST in normal range and within 360 days    AST  Date Value Ref Range Status  10/15/2020 15 10 - 35 U/L Final         Passed - Valid encounter within last 12 months    Recent Outpatient Visits           8 months ago [facility] [place], Coralie Keens, NP   9 months ago Tendinitis of left rotator cuff   [place], DO   10 months ago Paresthesia of left upper extremity   [place], DO   10 months ago Annual physical exam   [place], DO   1 year ago Restless leg syndrome   [place], Devonne Doughty, DO       Future Appointments             In 6 months [place], Ronda Fairly, MD [place]

## 2021-09-10 ENCOUNTER — Other Ambulatory Visit: Payer: Self-pay | Admitting: Family Medicine

## 2021-09-10 DIAGNOSIS — K219 Gastro-esophageal reflux disease without esophagitis: Secondary | ICD-10-CM

## 2021-09-10 NOTE — Telephone Encounter (Signed)
Requested Prescriptions  Pending Prescriptions Disp Refills  . omeprazole (PRILOSEC) 40 MG capsule [Pharmacy Med Name: OMEPRAZOLE DR 40 MG CAPSULE]  0     Gastroenterology: Proton Pump Inhibitors Passed - 09/10/2021 12:24 PM      Passed - Valid encounter within last 12 months    Recent Outpatient Visits          8 months ago [facility] [place], Coralie Keens, NP   9 months ago Tendinitis of left rotator cuff   [place], DO   10 months ago Paresthesia of left upper extremity   [place], DO   10 months ago Annual physical exam   [place], DO   1 year ago Restless leg syndrome   [place], Devonne Doughty, DO      Future Appointments            In 6 months [place], Ronda Fairly, MD [place]

## 2021-09-10 NOTE — Telephone Encounter (Signed)
Patient will need Prior Authorization to take long term. Insurance covers 45 per year only without PA.

## 2021-09-17 ENCOUNTER — Telehealth: Payer: Self-pay | Admitting: Family Medicine

## 2021-09-17 ENCOUNTER — Other Ambulatory Visit: Payer: Self-pay | Admitting: Family Medicine

## 2021-09-17 DIAGNOSIS — K219 Gastro-esophageal reflux disease without esophagitis: Secondary | ICD-10-CM

## 2021-09-17 NOTE — Telephone Encounter (Signed)
Patient called states, was told by pharmacyomeprazole (PRILOSEC) 40 MG capsule  isnt covered by his insurance and he is tryng to get it authorized or sent to another pharmacy, [facility]

## 2021-09-17 NOTE — Telephone Encounter (Signed)
Patient called in states, omeprazole (PRILOSEC) 40 MG capsule  isnt covered under insurance. He is trying ot get it authorized, or use another pharmacy, Secretary/administrator.

## 2021-09-18 MED ORDER — OMEPRAZOLE 40 MG PO CPDR: 40.0000 mg | DELAYED_RELEASE_CAPSULE | Freq: Every day | ORAL | 12 refills | Status: DC

## 2021-09-18 NOTE — Telephone Encounter (Signed)
Requested Prescriptions  Pending Prescriptions Disp Refills  . omeprazole (PRILOSEC) 40 MG capsule [Pharmacy Med Name: OMEPRAZOLE DR 40 MG CAPSULE]  0     Gastroenterology: Proton Pump Inhibitors Passed - 09/17/2021  3:31 PM      Passed - Valid encounter within last 12 months    Recent Outpatient Visits          8 months ago [facility] [place], Coralie Keens, NP   9 months ago Tendinitis of left rotator cuff   [place], DO   10 months ago Paresthesia of left upper extremity   [place], DO   11 months ago Annual physical exam   [place], DO   1 year ago Restless leg syndrome   [place], Devonne Doughty, DO      Future Appointments            In 6 months [place], Ronda Fairly, MD [place]

## 2021-09-18 NOTE — Telephone Encounter (Signed)
Pt called to report that he is completely out of his current supply. Pt states that packets have been called in for him instead of tablets. He needs Tablets  [id number] [place]  [place] [address]  Phone: [phone] Fax: [phone]

## 2021-09-18 NOTE — Telephone Encounter (Signed)
Called patient  Switched to Omeprazole '40mg'$  capsules 30 per bottle to [facility], he will use goodrx

## 2021-09-26 DIAGNOSIS — M7061 Trochanteric bursitis, right hip: Secondary | ICD-10-CM | POA: Diagnosis not present

## 2021-09-26 DIAGNOSIS — Z96641 Presence of right artificial hip joint: Secondary | ICD-10-CM | POA: Diagnosis not present

## 2021-09-26 DIAGNOSIS — M1711 Unilateral primary osteoarthritis, right knee: Secondary | ICD-10-CM | POA: Diagnosis not present

## 2021-09-26 DIAGNOSIS — M1611 Unilateral primary osteoarthritis, right hip: Secondary | ICD-10-CM | POA: Diagnosis not present

## 2021-10-01 DIAGNOSIS — H401233 Low-tension glaucoma, bilateral, severe stage: Secondary | ICD-10-CM | POA: Diagnosis not present

## 2021-10-04 DIAGNOSIS — Z23 Encounter for immunization: Secondary | ICD-10-CM | POA: Diagnosis not present

## 2021-10-17 DIAGNOSIS — M792 Neuralgia and neuritis, unspecified: Secondary | ICD-10-CM | POA: Diagnosis not present

## 2021-11-08 ENCOUNTER — Other Ambulatory Visit: Payer: Self-pay | Admitting: Family Medicine

## 2021-11-08 DIAGNOSIS — I10 Essential (primary) hypertension: Secondary | ICD-10-CM

## 2021-11-08 NOTE — Telephone Encounter (Signed)
Requested medication (s) are due for refill today - expired Rx  Requested medication (s) are on the active medication list -yes  Future visit scheduled -no  Last refill: 10/19/20 #90 3RF  Notes to clinic: expired Rx  Requested Prescriptions  Pending Prescriptions Disp Refills   hydrochlorothiazide (HYDRODIURIL) 12.5 MG tablet [Pharmacy Med Name: HYDROCHLOROTHIAZIDE 12.5 MG TB] 90 tablet 3    Sig: TAKE 1 TABLET BY MOUTH EVERY DAY     Cardiovascular: Diuretics - Thiazide Failed - 11/08/2021  1:29 AM      Failed - Cr in normal range and within 180 days    Creat  Date Value Ref Range Status  10/15/2020 0.97 0.70 - 1.35 mg/dL Final         Failed - K in normal range and within 180 days    Potassium  Date Value Ref Range Status  10/15/2020 4.1 3.5 - 5.3 mmol/L Final         Failed - Na in normal range and within 180 days    Sodium  Date Value Ref Range Status  10/15/2020 139 135 - 146 mmol/L Final         Failed - Valid encounter within last 6 months    Recent Outpatient Visits           10 months ago [facility] [place], Coralie Keens, NP   11 months ago Tendinitis of left rotator cuff   [place], DO   1 year ago Paresthesia of left upper extremity   [place], Devonne Doughty, DO   1 year ago Annual physical exam   [place], DO   1 year ago Restless leg syndrome   [place], DO       Future Appointments             In 4 months [place], Ronda Fairly, MD [facility] [place]            Passed - Last BP in normal range    BP Readings from Last 1 Encounters:  04/03/21 133/79            Requested Prescriptions  Pending Prescriptions Disp Refills   hydrochlorothiazide (HYDRODIURIL) 12.5 MG tablet [Pharmacy Med Name: HYDROCHLOROTHIAZIDE 12.5 MG TB] 90 tablet 3    Sig: TAKE 1  TABLET BY MOUTH EVERY DAY     Cardiovascular: Diuretics - Thiazide Failed - 11/08/2021  1:29 AM      Failed - Cr in normal range and within 180 days    Creat  Date Value Ref Range Status  10/15/2020 0.97 0.70 - 1.35 mg/dL Final         Failed - K in normal range and within 180 days    Potassium  Date Value Ref Range Status  10/15/2020 4.1 3.5 - 5.3 mmol/L Final         Failed - Na in normal range and within 180 days    Sodium  Date Value Ref Range Status  10/15/2020 139 135 - 146 mmol/L Final         Failed - Valid encounter within last 6 months    Recent Outpatient Visits           10 months ago [facility] [place], Coralie Keens, NP   11 months ago Tendinitis of left rotator cuff  [place], DO   1 year ago Paresthesia of left upper extremity   [place], DO   1 year ago Annual physical exam   [place], DO   1 year ago Restless leg syndrome   [facility] Olin Hauser, DO       Future Appointments             In 4 months [place], Ronda Fairly, MD [place] BP in normal range    BP Readings from Last 1 Encounters:  04/03/21 133/79

## 2021-11-11 ENCOUNTER — Other Ambulatory Visit: Payer: Self-pay | Admitting: Urology

## 2021-11-14 ENCOUNTER — Telehealth: Payer: Self-pay | Admitting: Family Medicine

## 2021-11-14 DIAGNOSIS — Z23 Encounter for immunization: Secondary | ICD-10-CM

## 2021-11-14 NOTE — Telephone Encounter (Signed)
Pt is wanting to see if he can get another Rx for he Shingle vaccine so that he is able to take it to the pharmacy.  Pt stated that he received the 1st one at the pharmacy but they do not have any record of him having it done and he stated that he remembers who gave it to him but they can not produce any kind of records of him having it done.  Pt stated that the pharmacy told him that he needs a new Rx for the Shingle vaccine.  Pt would also like to get a RSV vaccine done as well and he stated that the pharmacy will need a Rx for that and would like to see if we can give him that Rx as well.  Pt would like to have a call once the Rx's are ready to be picked up.

## 2021-11-15 MED ORDER — SHINGRIX 50 MCG/0.5ML IM SUSR: INTRAMUSCULAR | 1 refills | Status: DC

## 2021-11-15 NOTE — Addendum Note (Signed)
Addended by: Olin Hauser on: 11/15/2021 10:21 AM   Modules accepted: Orders

## 2021-11-15 NOTE — Telephone Encounter (Signed)
Okay  Please notify patient that the rx will be ready anytime after 115pm today when we re open.  I have printed Shingles vaccine  The RSV vaccine is not eligible for me to order in Epic. So I will have to handwrite that rx he can give to [facility] 11/15/2021, 10:21 AM

## 2021-11-24 ENCOUNTER — Other Ambulatory Visit: Payer: Self-pay | Admitting: Family Medicine

## 2021-11-24 DIAGNOSIS — E7849 Other hyperlipidemia: Secondary | ICD-10-CM

## 2021-11-25 NOTE — Telephone Encounter (Signed)
Requested Prescriptions  Pending Prescriptions Disp Refills   rosuvastatin (CRESTOR) 20 MG tablet [Pharmacy Med Name: ROSUVASTATIN CALCIUM 20 MG TAB] 90 tablet 0    Sig: TAKE 1 TABLET BY MOUTH EVERYDAY AT BEDTIME     Cardiovascular:  Antilipid - Statins 2 Failed - 11/24/2021 11:23 AM      Failed - Cr in normal range and within 360 days    Creat  Date Value Ref Range Status  10/15/2020 0.97 0.70 - 1.35 mg/dL Final         Failed - Lipid Panel in normal range within the last 12 months    Cholesterol  Date Value Ref Range Status  10/15/2020 151 <200 mg/dL Final   LDL Cholesterol (Calc)  Date Value Ref Range Status  10/15/2020 88 mg/dL (calc) Final    Comment:    Reference range: <100 . Desirable range <100 mg/dL for primary prevention;   <70 mg/dL for patients with CHD or diabetic patients  with > or = 2 CHD risk factors. [name] Kitchen LDL-C is now calculated using the Martin-Hopkins  calculation, which is a validated novel method providing  better accuracy than the Friedewald equation in the  estimation of LDL-C.  Cresenciano Genre et al. Annamaria Helling. 7782;423(53): 2061-2068  (http://education.[facility].com/faq/FAQ164)    HDL  Date Value Ref Range Status  10/15/2020 48 > OR = 40 mg/dL Final   Triglycerides  Date Value Ref Range Status  10/15/2020 64 <150 mg/dL Final         Passed - Patient is not pregnant      Passed - Valid encounter within last 12 months    Recent Outpatient Visits           11 months ago [facility], Coralie Keens, NP   12 months ago Tendinitis of left rotator cuff   [place], DO   1 year ago Paresthesia of left upper extremity   [place], DO   1 year ago Annual physical exam   [place], DO   1 year ago Restless leg syndrome   [facility] [place], Devonne Doughty, DO       Future  Appointments             In 4 months Stoioff, Ronda Fairly, MD [place]

## 2021-12-02 DIAGNOSIS — M1710 Unilateral primary osteoarthritis, unspecified knee: Secondary | ICD-10-CM | POA: Diagnosis not present

## 2021-12-07 DIAGNOSIS — G894 Chronic pain syndrome: Secondary | ICD-10-CM | POA: Diagnosis not present

## 2021-12-13 DIAGNOSIS — M1711 Unilateral primary osteoarthritis, right knee: Secondary | ICD-10-CM | POA: Diagnosis not present

## 2021-12-25 ENCOUNTER — Telehealth: Payer: Self-pay | Admitting: Family Medicine

## 2021-12-25 NOTE — Telephone Encounter (Signed)
Pt was called originally on 11/18/2021 at 11:27 to let him know that the two prescriptions were ready for pick up and he stated that he would be on his way that day.  Called the pt today to let him know of the previous call and he stated that he remember now speaking with me and that he has been so busy but will be by here today (12/25/2021) to pick them up.  Nothing further is needed.

## 2021-12-25 NOTE — Telephone Encounter (Signed)
Pt called to see if the 2 injections were sent to the pharmacy or if he is to come to the office to pick them up / he stated one was for RSV and the other for the sores on his body(maybe shingles)/ please advise

## 2021-12-31 DIAGNOSIS — M1711 Unilateral primary osteoarthritis, right knee: Secondary | ICD-10-CM | POA: Diagnosis not present

## 2022-01-03 ENCOUNTER — Ambulatory Visit: Payer: Self-pay | Admitting: *Deleted

## 2022-01-03 NOTE — Telephone Encounter (Signed)
  Chief Complaint: chest pain for 4 days Symptoms: "Feels like indigestion but today it's not going away" Frequency: For the last 4 days.     Pertinent Negatives: Patient denies Shortness of breath, breaking out in a sweat, dizziness, or radiation of pain from center upper chest. Disposition: '[x]'$ ED /'[]'$ Urgent Care (no appt availability in office) / '[]'$ Appointment(In office/virtual)/ '[]'$  [place] [facility]/ '[]'$ Home Care/ '[]'$ Refused Recommended Disposition /'[]'$ [place] [facility]/ '[]'$  Follow-up with PCP Additional Notes: Message sent to Dr. Parks Ranger.   Pt. Agreeable to going to the ED.   [facility]

## 2022-01-03 NOTE — Telephone Encounter (Signed)
Reason for Disposition  [1] Chest pain (or "angina") comes and goes AND [2] is happening more often (increasing in frequency) or getting worse (increasing in severity)  (Exception: Chest pains that last only a few seconds.)  Answer Assessment - Initial Assessment Questions 1. LOCATION: "Where does it hurt?"       I have a pain in my chest.      Started 4 days ago.   Heart problems run in my family.    Upper middle part of my chest 2. RADIATION: "Does the pain go anywhere else?" (e.g., into neck, jaw, arms, back)     I have problem with my left arm.   I've been seeing a dr for it for several months.   Radial nerve pain. No changes in the pain level  3. ONSET: "When did the chest pain begin?" (Minutes, hours or days)      No shortness of breath.   Started 4 days ago.   I've been out of town.   It feels like real bad indigestion.     4. PATTERN: "Does the pain come and go, or has it been constant since it started?"  "Does it get worse with exertion?"      Feels like indigestion  5. DURATION: "How long does it last" (e.g., seconds, minutes, hours)     Constant now 6. SEVERITY: "How bad is the pain?"  (e.g., Scale 1-10; mild, moderate, or severe)    - MILD (1-3): doesn't interfere with normal activities     - MODERATE (4-7): interferes with normal activities or awakens from sleep    - SEVERE (8-10): excruciating pain, unable to do any normal activities       Feels like indigestion 7. CARDIAC RISK FACTORS: "Do you have any history of heart problems or risk factors for heart disease?" (e.g., angina, prior heart attack; diabetes, high blood pressure, high cholesterol, smoker, or strong family history of heart disease)     It runs in my family heart disease 8. PULMONARY RISK FACTORS: "Do you have any history of lung disease?"  (e.g., blood clots in lung, asthma, emphysema, birth control pills)     Not asked 9. CAUSE: "What do you think is causing the chest pain?"     Indigestion is what it feels like  but it's not going away today. 10. OTHER SYMPTOMS: "Do you have any other symptoms?" (e.g., dizziness, nausea, vomiting, sweating, fever, difficulty breathing, cough)       Denies any other symptoms except left arm pain which he has radial nerve issues that he is being seen for.   Denies the pain in that arm feeling any different than what it usually does    Denies radiation of pain anywhere. 11. PREGNANCY: "Is there any chance you are pregnant?" "When was your last menstrual period?"       N/A  Protocols used: Chest Pain-A-AH

## 2022-01-05 DIAGNOSIS — J00 Acute nasopharyngitis [common cold]: Secondary | ICD-10-CM | POA: Diagnosis not present

## 2022-01-05 DIAGNOSIS — R059 Cough, unspecified: Secondary | ICD-10-CM | POA: Diagnosis not present

## 2022-01-07 ENCOUNTER — Ambulatory Visit: Payer: Self-pay

## 2022-01-07 DIAGNOSIS — J101 Influenza due to other identified influenza virus with other respiratory manifestations: Secondary | ICD-10-CM | POA: Diagnosis not present

## 2022-01-07 DIAGNOSIS — U071 COVID-19: Secondary | ICD-10-CM | POA: Diagnosis not present

## 2022-01-07 DIAGNOSIS — Z03818 Encounter for observation for suspected exposure to other biological agents ruled out: Secondary | ICD-10-CM | POA: Diagnosis not present

## 2022-01-07 NOTE — Telephone Encounter (Signed)
Pt is calling to follow up with Dr Raliegh Ip. Regarding a cough since Friday. Pt did go to UC. No available appts today. Did offer appt tomorrow with Webb Silversmith. Pt is still coughing. Please advise   Chief Complaint: Productive cough Symptoms: Above Frequency: Friday Pertinent Negatives: Patient denies fever Disposition: '[]'$ ED /'[]'$ Urgent Care (no appt availability in office) / '[x]'$ Appointment(In office/virtual)/ '[]'$  [place] Virtual [facility]/ '[]'$ Home Care/ '[]'$ Refused Recommended Disposition /'[]'$ [place] [facility]/ '[]'$  Follow-up with PCP Additional Notes:   Reason for Disposition  [1] Coughed up blood-tinged sputum AND [2] more than once  Answer Assessment - Initial Assessment Questions 1. ONSET: "When did the cough begin?"      Friday 2. SEVERITY: "How bad is the cough today?"      Moderate 3. SPUTUM: "Describe the color of your sputum" (none, dry cough; clear, white, yellow, green)     Red 4. HEMOPTYSIS: "Are you coughing up any blood?" If so ask: "How much?" (flecks, streaks, tablespoons, etc.)     Yes 5. DIFFICULTY BREATHING: "Are you having difficulty breathing?" If Yes, ask: "How bad is it?" (e.g., mild, moderate, severe)    - MILD: No SOB at rest, mild SOB with walking, speaks normally in sentences, can lie down, no retractions, pulse < 100.    - MODERATE: SOB at rest, SOB with minimal exertion and prefers to sit, cannot lie down flat, speaks in phrases, mild retractions, audible wheezing, pulse 100-120.    - SEVERE: Very SOB at rest, speaks in single words, struggling to breathe, sitting hunched forward, retractions, pulse > 120      No 6. FEVER: "Do you have a fever?" If Yes, ask: "What is your temperature, how was it measured, and when did it start?"     No 7. CARDIAC HISTORY: "Do you have any history of heart disease?" (e.g., heart attack, congestive heart failure)      No 8. LUNG HISTORY: "Do you have any history of lung disease?"  (e.g., pulmonary embolus, asthma, emphysema)      Asthma 9. PE RISK FACTORS: "Do you have a history of blood clots?" (or: recent major surgery, recent prolonged travel, bedridden)     No 10. OTHER SYMPTOMS: "Do you have any other symptoms?" (e.g., runny nose, wheezing, chest pain)       Runny nose 11. PREGNANCY: "Is there any chance you are pregnant?" "When was your last menstrual period?"       N/a 12. TRAVEL: "Have you traveled out of the country in the last month?" (e.g., travel history, exposures)       No  Protocols used: Cough - Acute Productive-A-AH

## 2022-01-08 ENCOUNTER — Other Ambulatory Visit: Payer: Self-pay | Admitting: Urology

## 2022-01-08 ENCOUNTER — Other Ambulatory Visit: Payer: Self-pay | Admitting: Family Medicine

## 2022-01-08 DIAGNOSIS — N401 Enlarged prostate with lower urinary tract symptoms: Secondary | ICD-10-CM

## 2022-01-08 DIAGNOSIS — E7849 Other hyperlipidemia: Secondary | ICD-10-CM

## 2022-01-08 NOTE — Telephone Encounter (Signed)
Unable to refill per protocol, Rx request is too soon. Last refill 11/25/21 for 90 days. Will refuse.  Requested Prescriptions  Pending Prescriptions Disp Refills   rosuvastatin (CRESTOR) 20 MG tablet [Pharmacy Med Name: ROSUVASTATIN CALCIUM 20 MG TAB] 90 tablet 0    Sig: TAKE 1 TABLET BY MOUTH EVERYDAY AT BEDTIME     Cardiovascular:  Antilipid - Statins 2 Failed - 01/08/2022  2:09 PM      Failed - Cr in normal range and within 360 days    Creat  Date Value Ref Range Status  10/15/2020 0.97 0.70 - 1.35 mg/dL Final         Failed - Lipid Panel in normal range within the last 12 months    Cholesterol  Date Value Ref Range Status  10/15/2020 151 <200 mg/dL Final   LDL Cholesterol (Calc)  Date Value Ref Range Status  10/15/2020 88 mg/dL (calc) Final    Comment:    Reference range: <100 . Desirable range <100 mg/dL for primary prevention;   <70 mg/dL for patients with CHD or diabetic patients  with > or = 2 CHD risk factors. [name] Kitchen LDL-C is now calculated using the Martin-Hopkins  calculation, which is a validated novel method providing  better accuracy than the Friedewald equation in the  estimation of LDL-C.  Cresenciano Genre et al. Annamaria Helling. 6759;163(84): 2061-2068  (http://education.[facility].com/faq/FAQ164)    HDL  Date Value Ref Range Status  10/15/2020 48 > OR = 40 mg/dL Final   Triglycerides  Date Value Ref Range Status  10/15/2020 64 <150 mg/dL Final         Passed - Patient is not pregnant      Passed - Valid encounter within last 12 months    Recent Outpatient Visits           1 year ago [facility] [place], Coralie Keens, NP   1 year ago Tendinitis of left rotator cuff   [place], DO   1 year ago Paresthesia of left upper extremity   [place], DO   1 year ago Annual physical exam   [facility] Olin Hauser, DO   1 year ago  Restless leg syndrome   [facility] [place], Devonne Doughty, DO       Future Appointments             In 2 months Stoioff, Ronda Fairly, MD [place]

## 2022-01-09 ENCOUNTER — Encounter: Payer: Self-pay | Admitting: Internal Medicine

## 2022-01-09 ENCOUNTER — Ambulatory Visit (INDEPENDENT_AMBULATORY_CARE_PROVIDER_SITE_OTHER): Payer: Medicare Other | Admitting: Internal Medicine

## 2022-01-09 VITALS — BP 121/61 | HR 68 | Temp 98.6°F | Resp 18 | Ht 68.5 in | Wt 208.2 lb

## 2022-01-09 DIAGNOSIS — J101 Influenza due to other identified influenza virus with other respiratory manifestations: Secondary | ICD-10-CM

## 2022-01-09 DIAGNOSIS — U071 COVID-19: Secondary | ICD-10-CM | POA: Diagnosis not present

## 2022-01-09 MED ORDER — AZITHROMYCIN 250 MG PO TABS: ORAL_TABLET | ORAL | 0 refills | Status: DC

## 2022-01-09 MED ORDER — PREDNISONE 10 MG PO TABS: ORAL_TABLET | ORAL | 0 refills | Status: DC

## 2022-01-09 NOTE — Patient Instructions (Signed)

## 2022-01-09 NOTE — Progress Notes (Signed)
Subjective:    Patient ID: Curtis Gregory, male    DOB: May 09, 1950, 71 y.o.   MRN: [id number]  HPI  Patient presents to clinic today with complaint of nasal congestion, cough, shortness of breath. This started 6 weeks ago when he was originally diagnosed with COVID but worsened 1 week ago. He is blowing clear mucous out of his nose. The cough is productive of yellow mucous. He denies headache, runny nose, ear pain, sore throat, chest pain, nausea, vomiting or diarrhea. He denies recent fever, chills or body aches. He has tried Theraflu and Mucinex OTC with minimal relief of symptoms. [facility], tested negative for flu and COVID at that time.  He was diagnosed with a viral respiratory illness and given Promethazine DM cough syrup.  He was subsequently seen at [facility] clinic walk-in on 12/26.  He tested positive for flu a and COVID at that time.  He was treated with Paxlovid.  He has a history of asthma managed with Albuterol as needed.  Review of Systems  Past Medical History:  Diagnosis Date   Asthma    Benign prostatic hypertrophy with urinary frequency 06/16/2014   Excessive urination at night 06/16/2014   GERD (gastroesophageal reflux disease)    Glaucoma    Hyperlipemia    Hypertension    Internal and external thrombosed hemorrhoids 03/15/2019   Sleep apnea     Current Outpatient Medications  Medication Sig Dispense Refill   albuterol (VENTOLIN HFA) 108 (90 Base) MCG/ACT inhaler Inhale 1-2 puffs into the lungs every 6 (six) hours as needed for wheezing or shortness of breath. Reported on 06/04/2015 8 g 3   Cholecalciferol (VITAMIN D3) 50 MCG (2000 UT) capsule Take 2,000 Units by mouth daily.     fexofenadine (ALLEGRA) 180 MG tablet TAKE 1 TABLET BY MOUTH EVERY DAY (Patient taking differently: Take 180 mg by mouth daily as needed for allergies.) 90 tablet 2   finasteride (PROSCAR) 5 MG tablet Take 1 tablet (5 mg total) by mouth daily. 90 tablet 3    fluticasone (FLONASE) 50 MCG/ACT nasal spray Place 2 sprays into both nostrils daily. Use for 4-6 weeks then stop and use seasonally or as needed. (Patient taking differently: Place 2 sprays into both nostrils daily as needed for rhinitis.) 16 g 3   hydrochlorothiazide (HYDRODIURIL) 12.5 MG tablet TAKE 1 TABLET BY MOUTH EVERY DAY 90 tablet 3   latanoprost (XALATAN) 0.005 % ophthalmic solution Place 1 drop into both eyes at bedtime.   0   meloxicam (MOBIC) 7.5 MG tablet Take 7.5 mg by mouth daily as needed.     omeprazole (PRILOSEC) 40 MG capsule Take 1 capsule (40 mg total) by mouth daily before breakfast. 30 capsule 12   rOPINIRole (REQUIP) 2 MG tablet Take 1 tablet (2 mg total) by mouth at bedtime. 90 tablet 3   rosuvastatin (CRESTOR) 20 MG tablet TAKE 1 TABLET BY MOUTH EVERYDAY AT BEDTIME 90 tablet 0   SHINGRIX injection Inject 0.5 mL into muscle for shingles vaccine. Repeat dose in 2-6 months. 0.5 mL 1   sildenafil (VIAGRA) 100 MG tablet TAKE 1 TABLET BY MOUTH 1 HOUR PRIOR TO INTERCOURSE 30 tablet 0   tamsulosin (FLOMAX) 0.4 MG CAPS capsule TAKE 1 CAPSULE (0.4 MG TOTAL) BY MOUTH DAILY AFTER BREAKFAST. 90 capsule 3   timolol (TIMOPTIC-XR) 0.5 % ophthalmic gel-forming Place 1 drop into the left eye at bedtime.   0   No current facility-administered medications  for this visit.    No Known Allergies  Family History  Problem Relation Age of Onset   Diabetes Mother    Heart disease Mother    Alzheimer's disease Father    Prostate cancer Father    Prostate cancer Brother    Kidney cancer Neg Hx     Social History   Socioeconomic History   Marital status: Married    Spouse name: Not on file   Number of children: 3   Years of education: Not on file   Highest education level: Master's degree (e.g., MA, MS, MEng, MEd, MSW, MBA)  Occupational History   Occupation: retired  Tobacco Use   Smoking status: Never   Smokeless tobacco: Never  Vaping Use   Vaping Use: Never used  Substance  and Sexual Activity   Alcohol use: Yes    Alcohol/week: 1.0 standard drink of alcohol    Types: 1 Glasses of wine per week    Comment: occasional   Drug use: No   Sexual activity: Not Currently  Other Topics Concern   Not on file  Social History Narrative   Not on file   Social Determinants of Health   Financial Resource Strain: Low Risk  (02/28/2021)   Overall Financial Resource Strain (CARDIA)    Difficulty of Paying Living Expenses: Not hard at all  Food Insecurity: No Food Insecurity (02/28/2021)   Hunger Vital Sign    Worried About Running Out of Food in the Last Year: Never true    Ran Out of Food in the Last Year: Never true  Transportation Needs: No Transportation Needs (02/28/2021)   PRAPARE - Hydrologist (Medical): No    Lack of Transportation (Non-Medical): No  Physical Activity: Insufficiently Active (02/28/2021)   Exercise Vital Sign    Days of Exercise per Week: 4 days    Minutes of Exercise per Session: 30 min  Stress: No Stress Concern Present (02/28/2021)   [place]    Feeling of Stress : Not at all  Social Connections: [place] (02/28/2021)   Social Connection and Isolation Panel [NHANES]    Frequency of Communication with Friends and Family: More than three times a week    Frequency of Social Gatherings with Friends and Family: More than three times a week    Attends Religious Services: More than 4 times per year    Active Member of [employer] or Organizations: Yes    Attends Music therapist: More than 4 times per year    Marital Status: Married  Human resources officer Violence: Not At Risk (09/15/2017)   Humiliation, Afraid, Rape, and Kick questionnaire    Fear of Current or Ex-Partner: No    Emotionally Abused: No    Physically Abused: No    Sexually Abused: No     Constitutional: Denies fever, malaise, fatigue, headache or abrupt weight changes.   HEENT: Patient reports nasal congestion.  Denies eye pain, eye redness, ear pain, ringing in the ears, wax buildup, runny nose, bloody nose, or sore throat. Respiratory: Patient reports cough and shortness of breath.  Denies difficulty breathing.   Cardiovascular: Denies chest pain, chest tightness, palpitations or swelling in the hands or feet.  Gastrointestinal: Denies abdominal pain, bloating, constipation, diarrhea or blood in the stool.  Musculoskeletal: Denies decrease in range of motion, difficulty with gait, muscle pain or joint pain and swelling.  Skin: Denies redness, rashes, lesions or ulcercations.  Neurological: Denies dizziness, difficulty with memory, difficulty with speech or problems with balance and coordination.   No other specific complaints in a complete review of systems (except as listed in HPI above).     Objective:   Physical Exam   BP 121/61 (BP Location: Right Arm, Patient Position: Sitting, Cuff Size: Large)   Pulse 68   Temp 98.6 F (37 C) (Oral)   Resp 18   Ht 5' 8.5" (1.74 m)   Wt 208 lb 3.2 oz (94.4 kg)   SpO2 100%   BMI 31.20 kg/m   Wt Readings from Last 3 Encounters:  04/03/21 214 lb (97.1 kg)  11/29/20 220 lb 12.8 oz (100.2 kg)  11/05/20 220 lb (99.8 kg)    General: Appears his stated age, obese, in NAD. Skin: Warm, dry and intact. No rashes noted. HEENT: Head: normal shape and size, no sinus tenderness noted; Eyes: sclera white, no icterus, conjunctiva pink, PERRLA and EOMs intact; Throat/Mouth: Teeth present, mucosa pink and moist, + PND, no exudate, lesions or ulcerations noted.  Neck: No adenopathy noted. Cardiovascular: Normal rate and rhythm. S1,S2 noted.  No murmur, rubs or gallops noted.  Pulmonary/Chest: Normal effort and positive vesicular breath sounds. No respiratory distress. No wheezes, rales or ronchi noted.  Musculoskeletal: No difficulty with gait.  Neurological: Alert and oriented.  BMET    Component Value Date/Time    NA 139 10/15/2020 0827   K 4.1 10/15/2020 0827   CL 104 10/15/2020 0827   CO2 27 10/15/2020 0827   GLUCOSE 106 (H) 10/15/2020 0827   BUN 17 10/15/2020 0827   CREATININE 0.97 10/15/2020 0827   CALCIUM 8.9 10/15/2020 0827   GFRNONAA >60 08/10/2020 0606   GFRNONAA 71 09/08/2017 0824   GFRAA >60 07/28/2019 1118   GFRAA 82 09/08/2017 0824    Lipid Panel     Component Value Date/Time   CHOL 151 10/15/2020 0827   TRIG 64 10/15/2020 0827   HDL 48 10/15/2020 0827   CHOLHDL 3.1 10/15/2020 0827   VLDL 10 05/22/2016 1001   LDLCALC 88 10/15/2020 0827    CBC    Component Value Date/Time   WBC 4.6 10/15/2020 0827   RBC 4.47 10/15/2020 0827   HGB 13.8 10/15/2020 0827   HCT 41.3 10/15/2020 0827   PLT 274 10/15/2020 0827   MCV 92.4 10/15/2020 0827   MCH 30.9 10/15/2020 0827   MCHC 33.4 10/15/2020 0827   RDW 12.0 10/15/2020 0827   LYMPHSABS 2,286 10/15/2020 0827   MONOABS 0.6 08/01/2020 0847   EOSABS 189 10/15/2020 0827   BASOSABS 51 10/15/2020 0827    Hgb A1C Lab Results  Component Value Date   HGBA1C 5.5 10/15/2020           Assessment & Plan:   Urgent Care Follow-up for Influenza A, COVID:  Urgent care notes and labs reviewed He is currently taking Paxlovid as prescribed He requested Tamiflu from [facility] walk-in however they did not send this in for him and he is outside the window at this time Encourage rest and fluids I think given the fact that he has been having symptoms for 6 weeks, it is reasonable to give him Rx for Prednisone taper x 6 days and Azithromycin 250 mg x 5 days for symptom management  Follow-up with your PCP as previously scheduled

## 2022-01-16 ENCOUNTER — Ambulatory Visit: Payer: Medicare Other | Admitting: Family Medicine

## 2022-01-27 DIAGNOSIS — M25561 Pain in right knee: Secondary | ICD-10-CM | POA: Diagnosis not present

## 2022-02-11 DIAGNOSIS — M25561 Pain in right knee: Secondary | ICD-10-CM | POA: Diagnosis not present

## 2022-02-13 ENCOUNTER — Ambulatory Visit (INDEPENDENT_AMBULATORY_CARE_PROVIDER_SITE_OTHER): Payer: Medicare Other | Admitting: Family Medicine

## 2022-02-13 ENCOUNTER — Encounter: Payer: Self-pay | Admitting: Family Medicine

## 2022-02-13 VITALS — BP 138/68 | HR 75 | Ht 68.5 in | Wt 197.0 lb

## 2022-02-13 DIAGNOSIS — R0789 Other chest pain: Secondary | ICD-10-CM | POA: Diagnosis not present

## 2022-02-13 DIAGNOSIS — R202 Paresthesia of skin: Secondary | ICD-10-CM

## 2022-02-13 DIAGNOSIS — K219 Gastro-esophageal reflux disease without esophagitis: Secondary | ICD-10-CM

## 2022-02-13 DIAGNOSIS — G5632 Lesion of radial nerve, left upper limb: Secondary | ICD-10-CM | POA: Diagnosis not present

## 2022-02-13 DIAGNOSIS — Z2911 Encounter for prophylactic immunotherapy for respiratory syncytial virus (RSV): Secondary | ICD-10-CM

## 2022-02-13 DIAGNOSIS — R12 Heartburn: Secondary | ICD-10-CM | POA: Diagnosis not present

## 2022-02-13 DIAGNOSIS — R0609 Other forms of dyspnea: Secondary | ICD-10-CM | POA: Diagnosis not present

## 2022-02-13 MED ORDER — SUCRALFATE 1 G PO TABS: 1.0000 g | ORAL_TABLET | Freq: Three times a day (TID) | ORAL | 2 refills | Status: DC

## 2022-02-13 MED ORDER — RSV PRE-FUSION F A&B VAC RCMB 120 MCG/0.5ML IM SOLR: 0.5000 mL | Freq: Once | INTRAMUSCULAR | 0 refills | Status: AC

## 2022-02-13 MED ORDER — OMEPRAZOLE 40 MG PO CPDR: 40.0000 mg | DELAYED_RELEASE_CAPSULE | Freq: Two times a day (BID) | ORAL | 3 refills | Status: DC

## 2022-02-13 NOTE — Progress Notes (Signed)
Subjective:    Patient ID: Curtis Gregory, male    DOB: [birth year], 72 y.o.   MRN: [id number]  Curtis Gregory is a 72 y.o. male presenting on 02/13/2022 for Numbness   HPI  Left Radial Neuritis Left Arm / Hand Numbness Followed by [facility] Pain and numbness is mostly located in thumb and index finger. 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 Also some median nerve. Nerve block was succesful  They recommended spinal / neck stimulator to treat the neuritis He is frustrated because lack of results. He already had prior MRI and they were asking about repeat imaging  He is requesting referral for 2nd opinion to Dr [facility] Recommend OT in future - he says not [facility].  Numbness on thumb and index finger  Atypical Chest Pain / Dyspnea GERD Started flying again and admits with some stressors and physically impacting him. He admits tightness in chest and heartburn and some dyspnea Can happen if stressed Taking Omeprazole '40mg'$  daily helps but not as effective it seems. Takes TUMs Worse digestive system with some reduced appetite, increased gas Rare NSAID. Mostly Tylenol.  Fam history of heart disease interested in testing     07/04/2020   10:47 AM 01/24/2020    1:22 PM 03/15/2019    9:41 AM  Depression screen PHQ 2/9  Decreased Interest 0 0 0  Down, Depressed, Hopeless 1 0 0  PHQ - 2 Score 1 0 0  Altered sleeping 1    Tired, decreased energy 0    Change in appetite 0    Feeling bad or failure about yourself  1    Trouble concentrating 0    Moving slowly or fidgety/restless 0    Suicidal thoughts 0    PHQ-9 Score 3    Difficult doing work/chores Not difficult at all      Social History   Tobacco Use   Smoking status: Never   Smokeless  tobacco: Never  Vaping Use   Vaping Use: Never used  Substance Use Topics   Alcohol use: Yes    Alcohol/week: 1.0 standard drink of alcohol    Types: 1 Glasses of wine per week    Comment: occasional   Drug use: No    Review of Systems Per HPI unless specifically indicated above     Objective:    BP 138/68   Pulse 75   Ht 5' 8.5" (1.74 m)   Wt 197 lb (89.4 kg)   SpO2 100%   BMI 29.52 kg/m   Wt Readings from Last 3 Encounters:  02/13/22 197 lb (89.4 kg)  01/09/22 208 lb 3.2 oz (94.4 kg)  04/03/21 214 lb (97.1 kg)    Physical Exam Vitals and nursing note reviewed.  Constitutional:      General: He is not in acute distress.    Appearance: Normal appearance. He is well-developed. He is not diaphoretic.     Comments: Well-appearing, comfortable, cooperative  HENT:     Head: Normocephalic and atraumatic.  Eyes:     General:        Right eye: No discharge.        Left eye: No discharge.     Conjunctiva/sclera: Conjunctivae normal.  Cardiovascular:     Rate and Rhythm: Normal rate.  Pulmonary:  Effort: Pulmonary effort is normal.  Musculoskeletal:     Cervical back: Neck supple.     Comments: Left upper extremity with intact ROM  Skin:    General: Skin is warm and dry.     Findings: No erythema or rash.  Neurological:     General: No focal deficit present.     Mental Status: He is alert and oriented to person, place, and time.     Sensory: Sensory deficit (left thumb index finger reduced sensation) present.  Psychiatric:        Mood and Affect: Mood normal.        Behavior: Behavior normal.        Thought Content: Thought content normal.     Comments: Well groomed, good eye contact, normal speech and thoughts     I have personally reviewed the radiology report from 09/04/21 on NCS / MRI.  Motor NCS   Latency Amp Dur F-Lat Segment Distance CV Temp Comment  Site (ms) Norm (mV) Norm ms (ms) (cm) (m/s) Norm C  Left Median (APB) Motor  Wrist 2.8 < 4.4 5.8  > 4.2 4.7 29.2 Wrist-APB 6.5 -  Elbow 7.5 - 5.6 > 4.2 4.9 Elbow-Wrist 27 57 > 49 -  Left Ulnar (ADM) Motor  Wrist 2.9 < 3.5 6.2 > 5.6 5.7 29.3 Wrist-ADM 6.5 -  Bel Elbow 6.4 - 6.0 > 5.6 6.2 Bel Elbow-Wrist 21.5 61 > 49 -  Abv Elbow 8.0 - 5.7 > 5.6 6.3 Abv Elbow-Wrist 31.5 62 - -   Sensory NCS   Latency (Peak) Amp (Tilt) Segment Distance CV (Onset) Temperature Comment  Site (ms) Norm V Norm (cm) m/s Norm C  Left Median (Ortho) Sensory  Dig II-Wrist 3.1 < 3.5 16 > 10 Dig II-Wrist 13 52 - -  Left Radial Sensory  Forearm-Wrist 2.4 < 2.6 36 > 17 Forearm-Wrist 10 56 - -  Left Ulnar (Ortho) Sensory  Dig V-Wrist 2.8 < 2.9 10 > 5 Dig V-Wrist 11 48 - -   EMG+  Side Muscle IA Fibs PSW Fasc H.F. Amp Dur PPP Recrt Comment  Left Deltoid Nml None None None None Nml Nml Nml Nml  Left Biceps Nml None None None None Nml Nml Nml Nml  Left Triceps Nml None None None None 2+ 1+ Nml 1-  Left ED Nml None None None None 1+ 1+ Many 1-  Left FDI Nml None None None None Nml Nml Nml Nml  Left Pronator Teres Nml None None None None Nml Nml Nml Nml  Left APB Nml None None None None Nml Nml Nml Nml  Left Cervical Parasp (Upper) Nml None None None None  Left Cervical Parasp (Mid) Nml None None None None  Left Cervical Parasp (Lower) Nml None None None None    Summary: NCS: Studies were performed after warming limbs to 34 degrees C with limb temperature maintained at 32-34 degrees C throughout testing.    The left median motor nerve response is normal with F-wave response. The left ulnar motor nerve response is normal with normal F-wave response.    The left median, left radial and left ulnar sensory nerve responses are normal.   EMG: Concentric needle examination was performed focusing on the left upper extremity. The left triceps and EDC muscles were abnormal with tall, wide motor unit potentials with reduced recruitment, indicative of chronic reinnervation changes.    Conclusion: This is an  abnormal study. There is electrodiagnostic evidence of a chronic left radial neuropathy, proximal  to the takeoff of the triceps muscle.   There is no evidence of left cervical radiculopathy.   CLINICAL DATA: Numbness of left fingers for 6 months.   EXAM:  MRI OF THE LEFT HUMERUS WITHOUT AND WITH CONTRAST   TECHNIQUE:  Multiplanar, multisequence MR imaging of the left humerus was  performed before and after the administration of intravenous  contrast.   CONTRAST: 66m GADAVIST GADOBUTROL 1 MMOL/ML IV SOLN   COMPARISON: MRI left shoulder 11/19/2020   FINDINGS:  Bones/Joint/Cartilage   Moderate glenohumeral cartilage thinning. Mild inferior humeral  head-neck junction degenerative osteophytosis.   Mild degenerative subchondral cystic change within the capitellum at  the radiocapitellar joint of the elbow. Moderate capitellar  cartilage thinning. Mild-to-moderate trochlea-coronoid cartilage  thinning within the medial elbow.   No marrow edema or acute fracture within the humerus. The humeral  cortex is intact.   Moderate acromioclavicular osteoarthritis. Mild lateral downsloping  of the acromion. Mild-to-moderate anterior inferior acromial spur  (sagittal series 11, image 8).   Ligaments   Evaluation of the medial and lateral elbow ligaments is limited by  location at the peripheral field-of-view and decreased signal to  noise/inhomogeneous fat saturation. No gross ligament tear is  visualized.   Muscles and Tendons   There is mild edema within the proximal and mid aspect of the  lateral head of the triceps muscle (axial series 3 images numeral 28  through 34 and axial series 4 images 1 through 14). No definite  signal abnormality is seen within the long or medial heads of the  triceps muscle. No muscle atrophy or fatty infiltration.   Soft tissues   Normal signal and course of the median and ulnar nerves. No  abnormality is seen along the course of the radial nerve,  noting  that the radial nerve innervates the lateral head of the triceps  muscle.   IMPRESSION:  1. There is edema within the proximal mid aspect of the lateral head  of the triceps muscle. This is nonspecific. It may be due to muscle  strain. It may be secondary to denervation. No signal abnormality or  space-occupying lesion is seen along the course of the radial nerve  that innervates the triceps muscle.  2. 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  3. 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    Results for orders placed or performed in visit on 03/21/21  PSA  Result Value Ref Range   Prostate Specific Ag, Serum 0.4 0.0 - 4.0 ng/mL      Assessment & Plan:   Problem List Items Addressed This Visit     GERD (gastroesophageal reflux disease)   Relevant Medications   omeprazole (PRILOSEC) 40 MG capsule   sucralfate (CARAFATE) 1 g tablet   Paresthesia of left upper extremity   Relevant Orders   Ambulatory referral to Orthopedic Surgery   Other Visit Diagnoses     Radial neuritis, left    -  Primary   Relevant Orders   Ambulatory referral to Orthopedic Surgery   Dyspnea on exertion       Relevant Orders   CT CARDIAC SCORING (SELF PAY ONLY)   Heartburn       Relevant Orders   CT CARDIAC SCORING (SELF PAY ONLY)   Atypical chest pain       Relevant Orders   CT CARDIAC SCORING (SELF PAY ONLY)   Need for RSV vaccination       Relevant Medications   RSV bivalent vaccine (ABRYSVO) 120  MCG/0.5ML injection       Atypical Chest Pain GERD / Indigestion  Discussion today on cardiac risk factors. With his fam history and some atypical symptoms we discussed goal to check imaging Coronary Artery Calcium Scan - Ordered. [facility] should contact you to schedule, $100 flat fee, no insurance involved.  For his GERD symptoms  Double dose Omeprazole '40mg'$ , now twice a day before meals for stomach acid and symptoms New rx. If not covered would  switch back to '40mg'$  and consider '20mg'$  TWICE A DAY or other options.  May need consult GI in future for EGD if unresolved  Try Rx Sucralfate instead of TUMs  Referral to Dr Roland Rack for 2nd opinion He can refer to Occupational Therapy OT as needed based on eval.  Orders Placed This Encounter  Procedures   CT CARDIAC SCORING (SELF PAY ONLY)    Standing Status:   Future    Standing Expiration Date:   02/14/2023    Order Specific Question:   Preferred imaging location?    Answer:   [place]   Ambulatory referral to Orthopedic Surgery    Referral Priority:   Routine    Referral Type:   Surgical    Referral Reason:   Specialty Services Required    Referred to Provider:   Corky Mull, MD    Requested Specialty:   Orthopedic Surgery    Number of Visits Requested:   1     Meds ordered this encounter  Medications   omeprazole (PRILOSEC) 40 MG capsule    Sig: Take 1 capsule (40 mg total) by mouth 2 (two) times daily before a meal.    Dispense:  180 capsule    Refill:  3    Dose increase on med to BID   sucralfate (CARAFATE) 1 g tablet    Sig: Take 1 tablet (1 g total) by mouth 4 (four) times daily -  with meals and at bedtime. As needed for Heartburn symptoms    Dispense:  90 tablet    Refill:  2   RSV bivalent vaccine (ABRYSVO) 120 MCG/0.5ML injection    Sig: Inject 0.5 mLs into the muscle once for 1 dose.    Dispense:  0.5 mL    Refill:  0     Follow up plan: Return if symptoms worsen or fail to improve.   [name], DO [facility] 02/13/2022, 10:56 AM

## 2022-02-13 NOTE — Patient Instructions (Addendum)
Thank you for coming to the office today.  Coronary Artery Calcium Scan - Ordered. [facility] should contact you to schedule, $100 flat fee, no insurance involved.  Double dose Omeprazole '40mg'$ , now twice a day before meals for stomach acid and symptoms  May need consult GI in future  Try Sucralfate instead of TUMs  Referral to Dr Roland Rack for 2nd opinion He can refer to Occupational Therapy OT as needed based on eval.  If you have any significant chest pain that does not go away within 30 minutes, is accompanied by nausea, sweating, shortness of breath, or made worse by activity, this may be evidence of a heart attack, especially if symptoms worsening instead of improving, please call 911 or go directly to the emergency room immediately for evaluation.   Please schedule a Follow-up Appointment to: Return if symptoms worsen or fail to improve.  If you have any other questions or concerns, please feel free to call the office or send a message through [facility]. You may also schedule an earlier appointment if necessary.  Additionally, you may be receiving a survey about your experience at our office within a few days to 1 week by e-mail or mail. We value your feedback.  [name], DO [facility]

## 2022-02-17 DIAGNOSIS — M25561 Pain in right knee: Secondary | ICD-10-CM | POA: Diagnosis not present

## 2022-02-20 ENCOUNTER — Telehealth: Payer: Self-pay | Admitting: Family Medicine

## 2022-02-20 ENCOUNTER — Ambulatory Visit
Admission: RE | Admit: 2022-02-20 | Discharge: 2022-02-20 | Disposition: A | Discharge status: Home / Self Care | Payer: Medicare Other | Source: Ambulatory Visit | Attending: Family Medicine | Admitting: Family Medicine

## 2022-02-20 DIAGNOSIS — R0789 Other chest pain: Secondary | ICD-10-CM | POA: Insufficient documentation

## 2022-02-20 DIAGNOSIS — R0609 Other forms of dyspnea: Secondary | ICD-10-CM | POA: Insufficient documentation

## 2022-02-20 DIAGNOSIS — R12 Heartburn: Secondary | ICD-10-CM | POA: Insufficient documentation

## 2022-02-20 NOTE — Telephone Encounter (Signed)
Left message for patient to schedule their Annual Wellness Visit (AWV) with Health Nurse Advisor Kirke Shaggy at [facility].  Please call [phone] ask for [facility]

## 2022-02-24 DIAGNOSIS — M25561 Pain in right knee: Secondary | ICD-10-CM | POA: Diagnosis not present

## 2022-03-04 DIAGNOSIS — M25561 Pain in right knee: Secondary | ICD-10-CM | POA: Diagnosis not present

## 2022-03-08 DIAGNOSIS — I469 Cardiac arrest, cause unspecified: Secondary | ICD-10-CM | POA: Diagnosis not present

## 2022-03-14 DEATH — deceased

## 2022-03-20 ENCOUNTER — Telehealth: Payer: Self-pay | Admitting: Family Medicine

## 2022-03-20 NOTE — Telephone Encounter (Addendum)
Note this documentation has taken place since the patient has been deceased. Date of death was 03/16/22 as documented by [facility] Emergency Department note on 03/16/2022.  Copied below is information from his family's phone call to our call center earlier today 03/20/22 and they spoke with our office manager.   Copied from [place] [phone]. Topic: Complaint - Care >> Mar 20, 2022  9:29 AM Ludger Nutting wrote: Patient's wife would like a callback with some answers about patient's medical conditions that led to his death. Patient's wife states that she received his death certificate on [id number] and it listed a cause of death for a condition that patient was not being treated for. Patient's wife is very upset. >> Mar 20, 2022 12:59 PM Renato Gails wrote: Called patient and left message to call back to the office.   Spoke with Vaughan Basta patient's wife and she is requesting to speak wit Dr. Parks Ranger to help her understand the diagnosis on her husbands death certificate (Hypertension Cardio vascular disease) Per Vaughan Basta her husband was heathy and just trying to wrap her mind around his death after he received a clean bill of health at last appointment in February 2024.  Please call Mylan Danese back at [phone].      -----------------  I reviewed the above information and called Zekai Valenta back earlier today 03/20/22 around 2pm today to answer her questions concerning her husband's recent passing and their concerns with the diagnosis that was reported on the death certificate. I spoke with Vaughan Basta and also her son Gaspar Bidding present on the call and participated in the conversation as well while I was on speaker phone.  Note the death certificate was completed by medical professionals in [place] the state at which he was pronounced dead, as this document was not completed by myself or our office. I do not have access to his death certificate within the [facility] portal at this time.  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 The main questions that they had for me today were about how could he have passed from this severe hypertensive heart disease and why we were unaware of it previously. They both expressed their concern and frustration with this discrepancy and wanted to better understand what happened with his recent sudden passing.  I reviewed his medical record with them starting with the recent visit on 02/13/22 with me in office. At that time, due to his symptoms as documented, we pursued the Coronary CT Calcium Scoring test. Based on his results he was assigned an average 46th percentile risk stratification score and he was already on statin therapy. I did provide Gibran with return precautions at the time of his visit with me, reviewing when to seek more medical care or return for follow-up if symptoms were not responding to the treatment plan as expected or any significant worsening concerns.   I reviewed the report on this Coronary imaging test and reviewed his chart for other past imaging. I am not able to find documentation of an enlarged heart within this report. And he has not had a diagnosis of enlarged heart previously, or other confirmed coronary artery disease.  Our discussion was focused on the medical history that we have documented for Etienne over the  past several years. He did have a diagnosis of chronic Hypertension and Hyperlipidemia. His last routine Annual Physical focusing on documenting these conditions was 10/19/20. There was a 1 year gap in 2023 where he focused on acute visits from Viral Respiratory illness, to Orthopedic/Neurosurgical issues managed by various specialists. I discussed with them that even though these cardiovascular conditions were  controlled by medication therapy, they are still risk factors. I advised them that my best explanation is that years of hypertension, hyperlipidemia still contribute to heart disease, and at age 3 with these risk factors unfortunately sudden events can occur that lead to heart attack and cardiac arrest.  Overall, they acknowledged my effort to help describe his medical history and the contributing factors to his passing, but they do still have concerns regarding the diagnosis given by the medical examiner/provider on his death certificate.  They are requesting copies of his medical records. I advised them that I would forward their request directly to our office manager to pursue the processes of obtaining medical records for them.  [name], [facility] 03/20/2022, 5:07 PM

## 2022-03-20 NOTE — Telephone Encounter (Signed)
Copied from [place] [phone]. Topic: Complaint - Care >> Mar 20, 2022  9:29 AM Ludger Nutting wrote: Patient's wife would like a callback with some answers about patient's medical conditions that led to his death. Patient's wife states that she received his death certificate on [id number] and it listed a cause of death for a condition that patient was not being treated for. Patient's wife is very upset. >> Mar 20, 2022 12:59 PM Renato Gails wrote: Called patient and left message to call back to the office.  Spoke with Vaughan Basta patient's wife and she is requesting to speak wit Dr. Parks Ranger to help her understand the diagnosis on her husbands death certificate (Hypertension Cardio vascular disease) Per Vaughan Basta her husband was heathy and just trying to wrap her mind around his death after he received a clean bill of health at last appointment in February 2024.  Please call Zackaria Hontz back at [phone].

## 2022-04-01 NOTE — Telephone Encounter (Signed)
Called and spoke with [place].  Instructed him that his mother can come to the office and sign a medical records release.  We also have to send a copy of the death certificate with the release.

## 2022-04-01 NOTE — Telephone Encounter (Addendum)
Aundra Carlton son of patient is calling back because he has not heard from the office or received his fathers medical records.  He would like call back he stated this is his 2nd call and would like a call back this week. Mr Vandine the son and Mrs Lonero  stated they are feeling very disrespected.

## 2022-04-04 ENCOUNTER — Ambulatory Visit: Payer: Managed Care, Other (non HMO) | Admitting: Urology

## 2022-05-10 ENCOUNTER — Other Ambulatory Visit: Payer: Self-pay | Admitting: Urology

## 2022-05-10 DIAGNOSIS — N401 Enlarged prostate with lower urinary tract symptoms: Secondary | ICD-10-CM

## 2022-10-12 IMAGING — XA DG HIP (WITH PELVIS) OPERATIVE*R*
1 series · 4 of 4 positions shown · non-contrast
Comparison: None.

CLINICAL DATA: 70-year-old male with right hip arthroplasty.

EXAM:
OPERATIVE right HIP (WITH PELVIS IF PERFORMED) 1 VIEWS
TECHNIQUE: Fluoroscopic spot image(s) were submitted for interpretation
post-operatively.

[Series 9: ortho standard · 4 of 9 frames shown]
[frame 1/9]
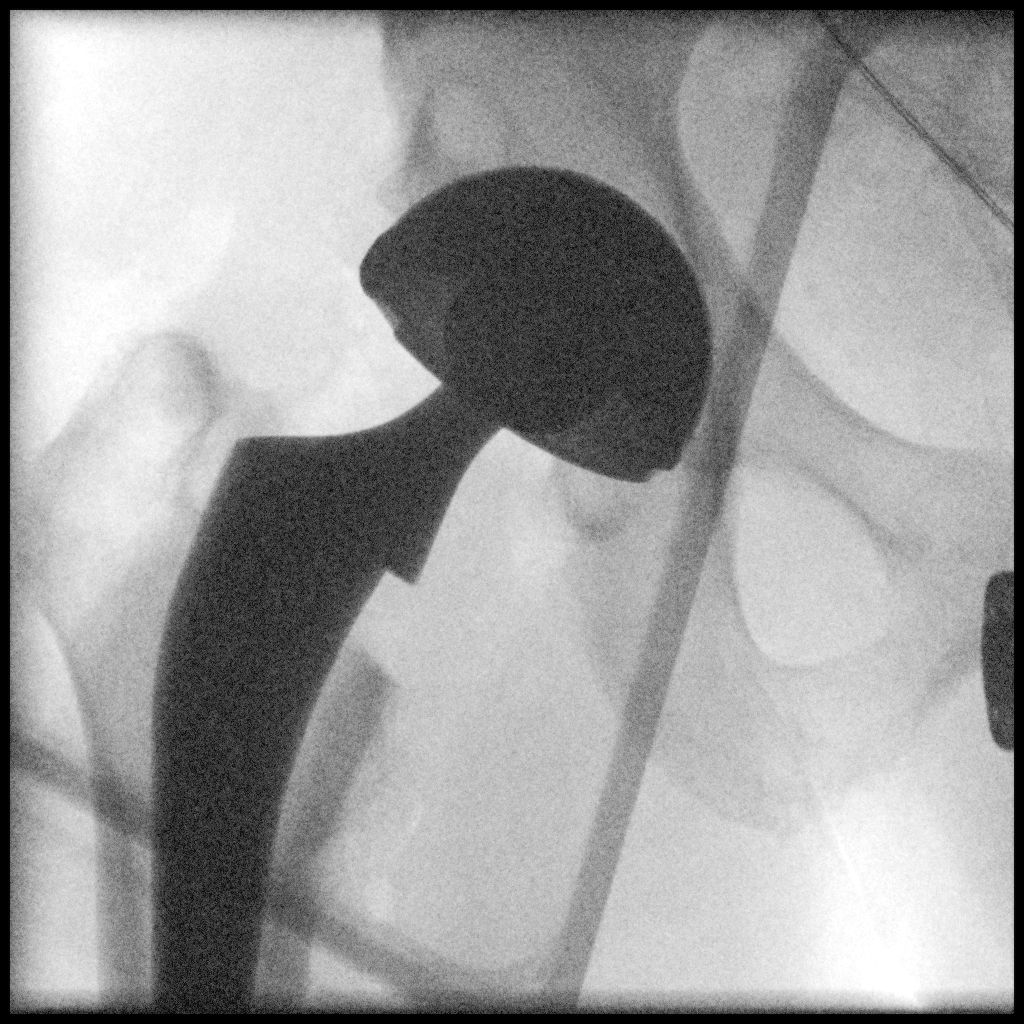
[frame 2/9]
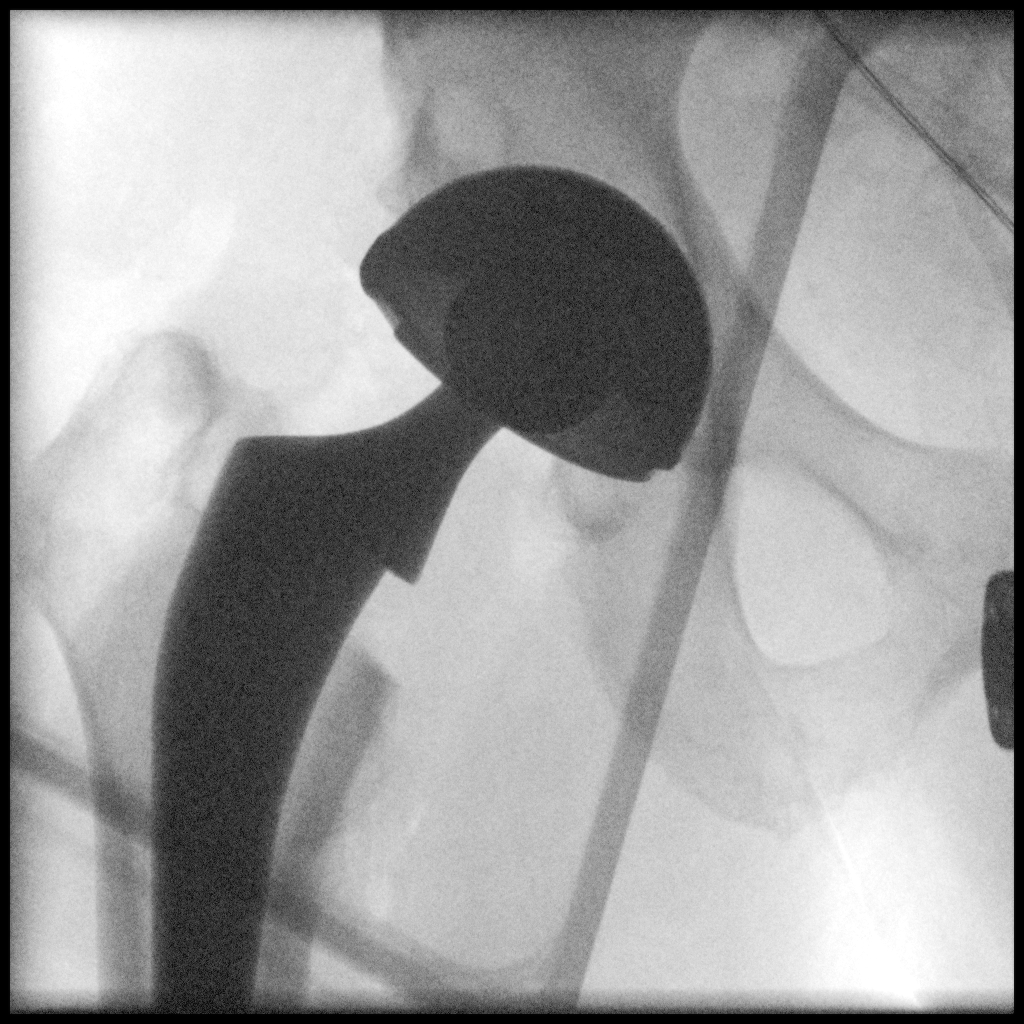
[frame 5/9]
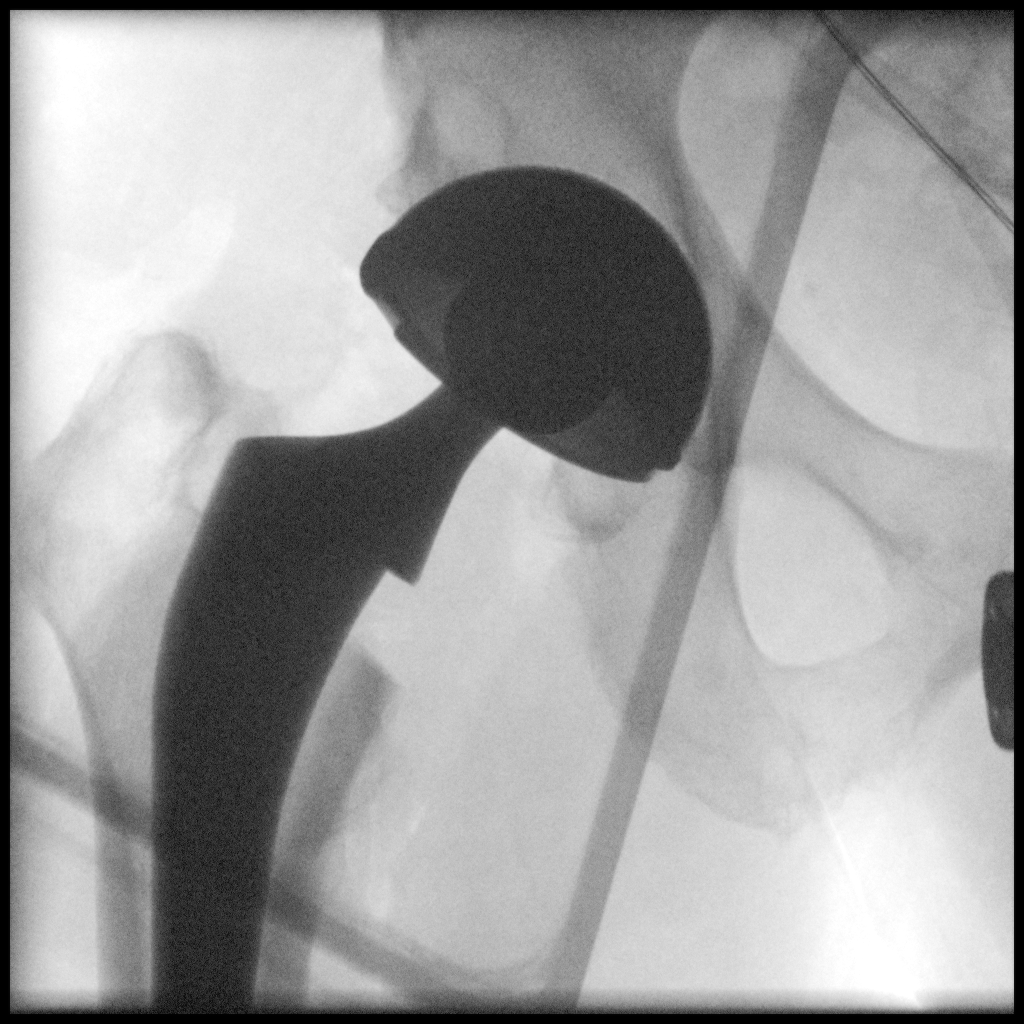
[frame 8/9]
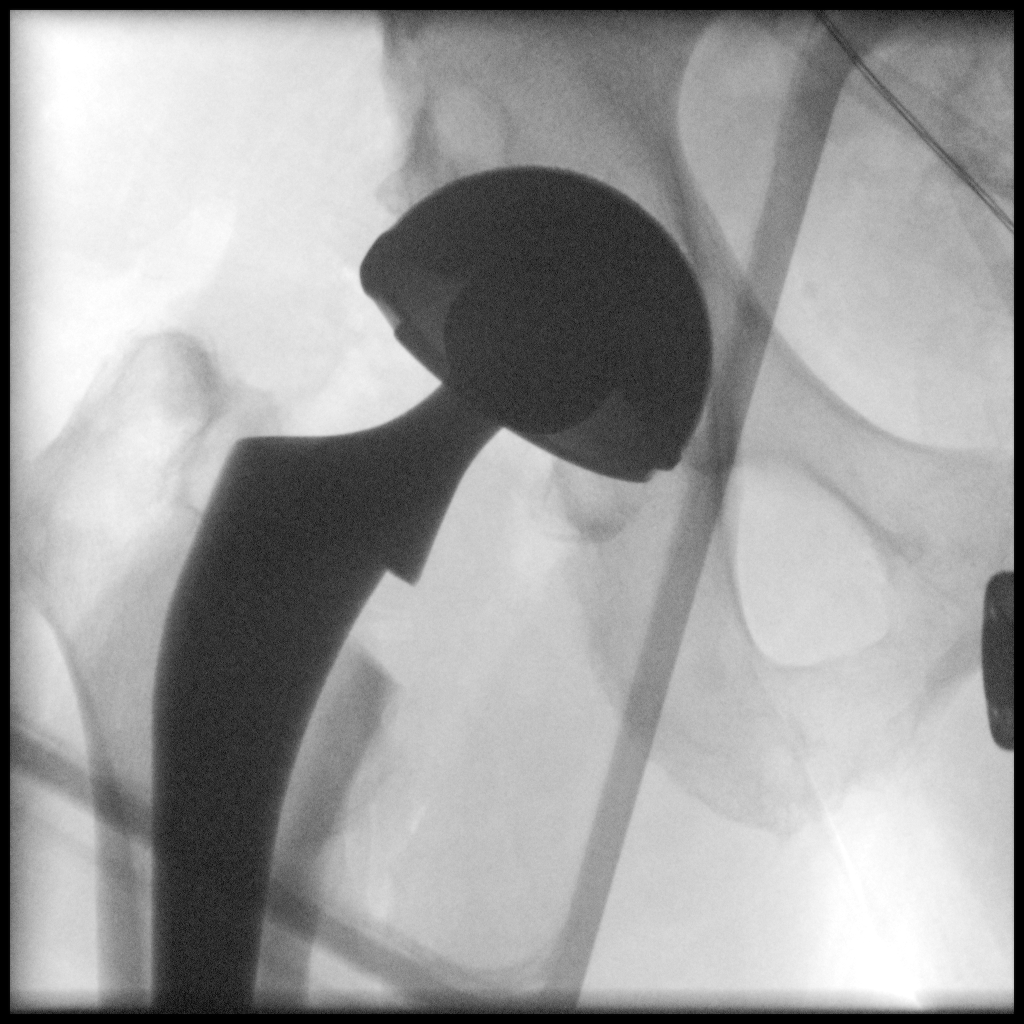

[4 of 4 positions shown; findings below may reference images not displayed]

FINDINGS: Single intraoperative fluoroscopic spot images of the right hip
provided. The total right hip arthroplasty. The total fluoroscopic
time is 18 seconds with a total air kerma of 7.0 mGy.
IMPRESSION: Right hip arthroplasty.

## 2022-10-12 IMAGING — DX DG HIP (WITH OR WITHOUT PELVIS) 2-3V*R*
2 series · 3 of 3 positions shown · non-contrast
Comparison: None.

CLINICAL DATA: Status post right hip replacement.

EXAM:
DG HIP (WITH OR WITHOUT PELVIS) 2-3V RIGHT

[hip ap]
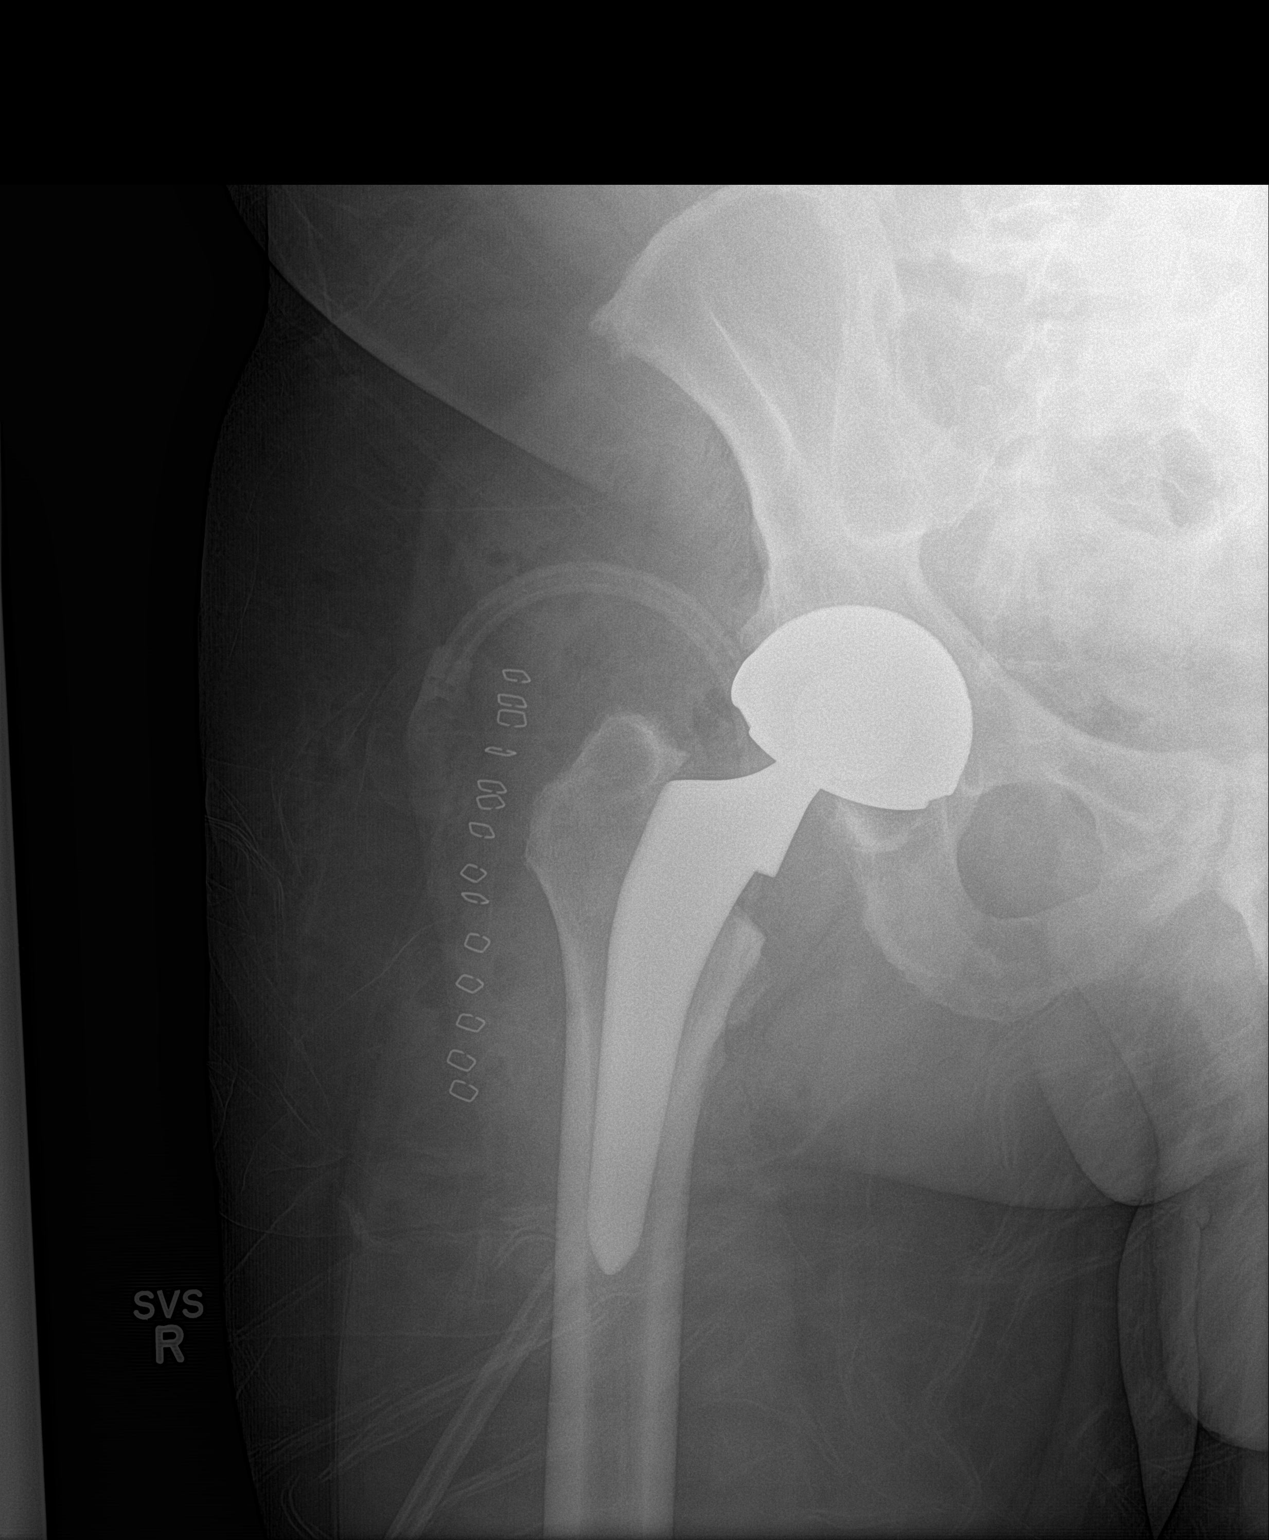

[Series 3: hip lat · 0.14mm/px · 2 of 2 slices shown]
[im 1/2]
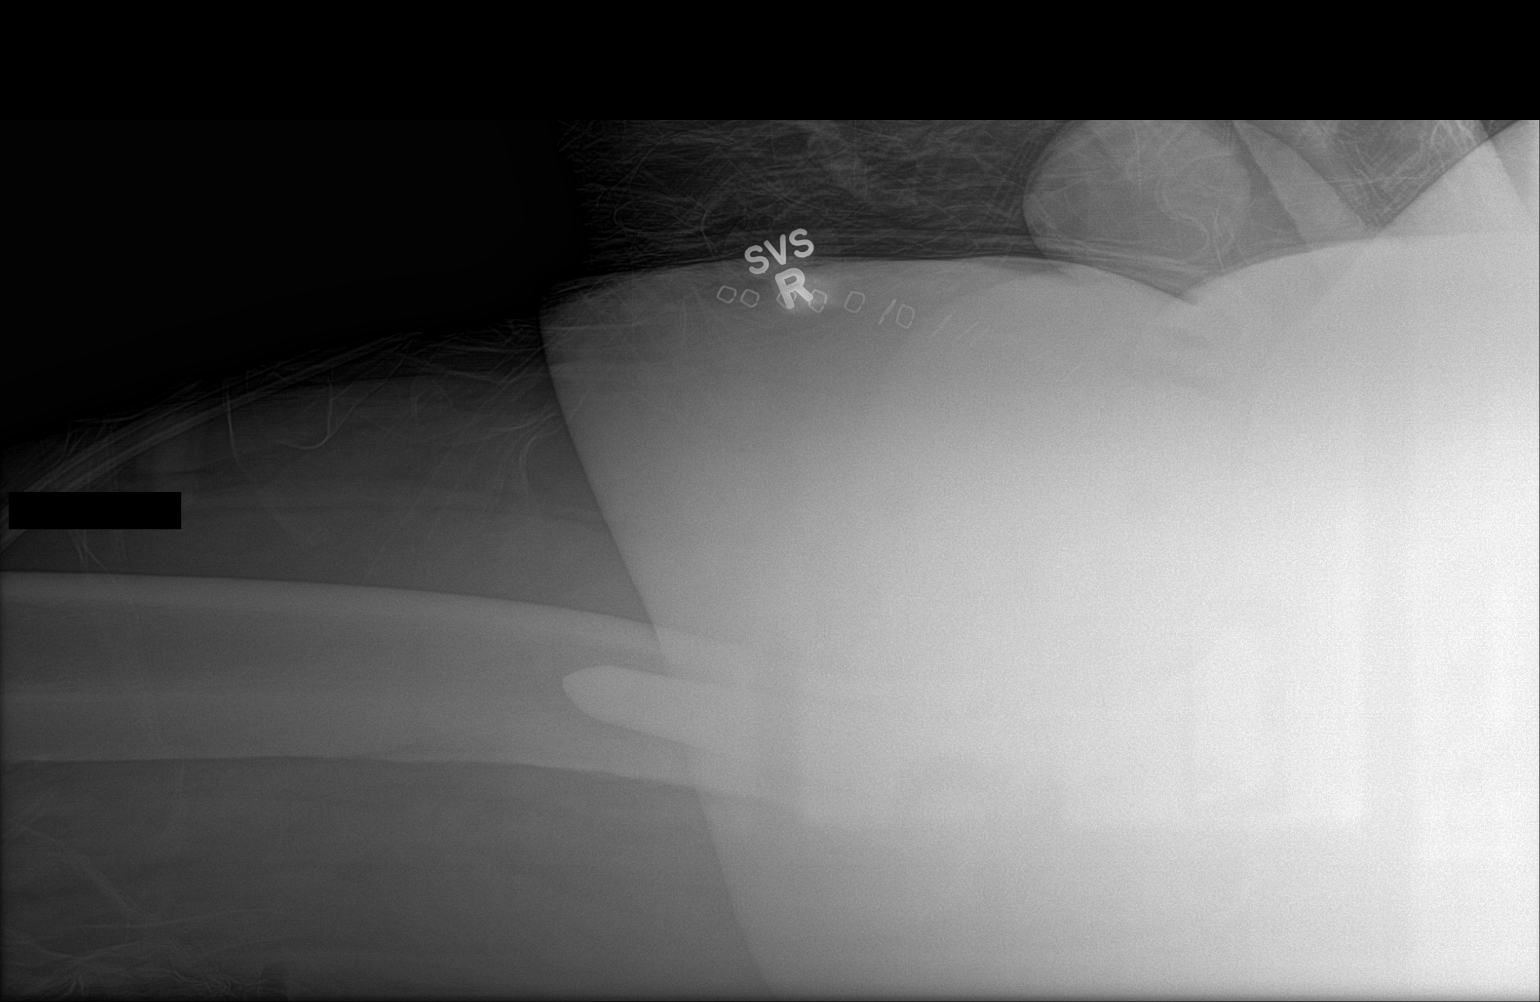
[im 2/2]
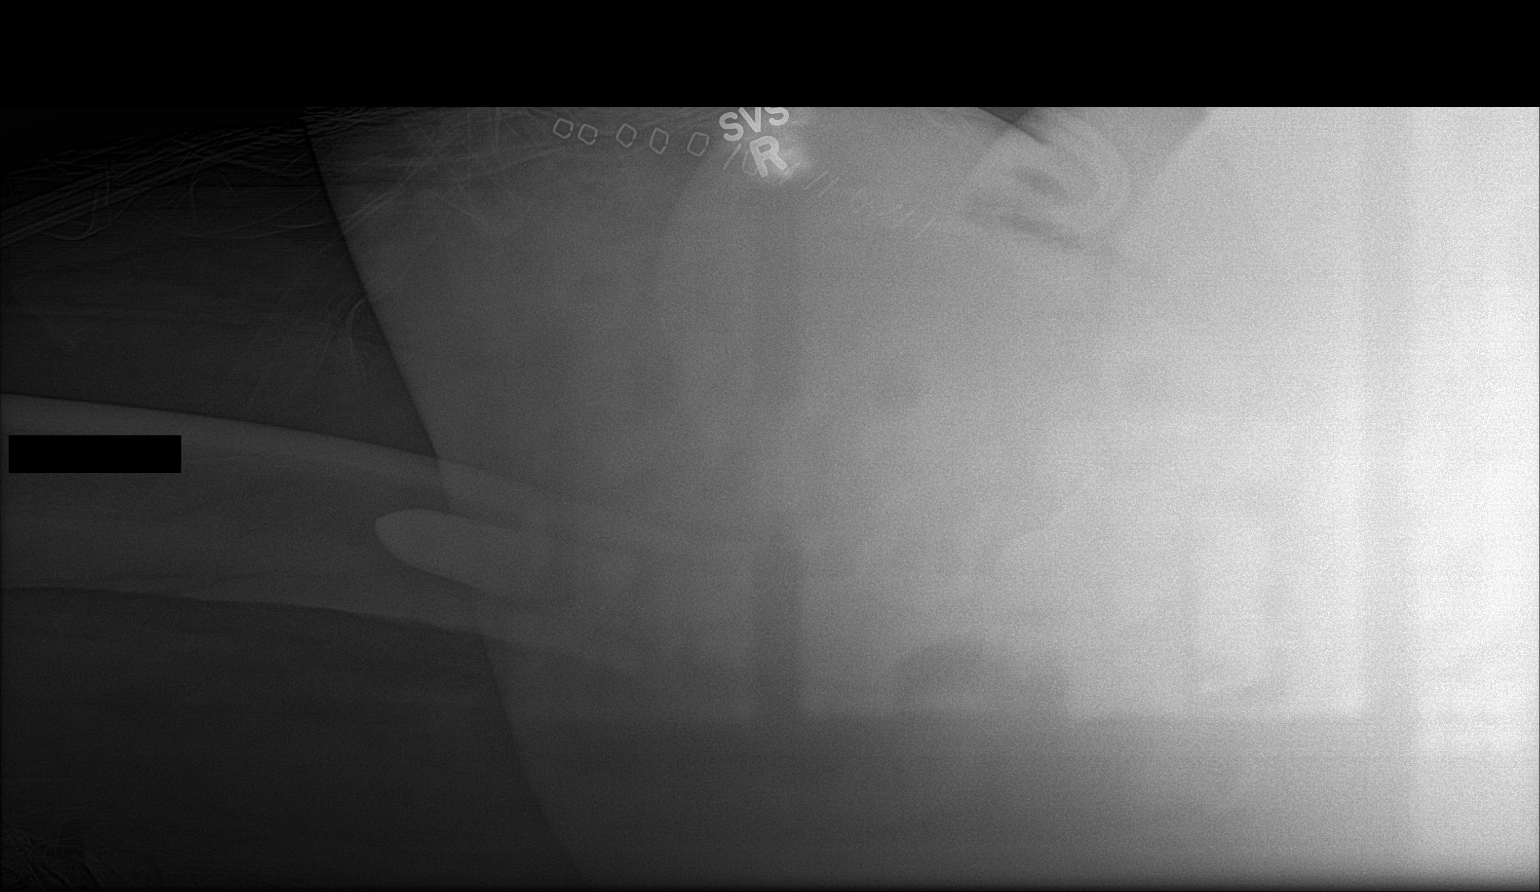

[3 of 3 positions shown; findings below may reference images not displayed]

FINDINGS: The right acetabular and femoral components are well situated.
Expected postoperative changes are noted in the surrounding soft
tissues.
IMPRESSION: Status post right total hip arthroplasty.
